# Patient Record
Sex: Male | Born: 1935 | Race: White | Hispanic: No | Smoking: Former smoker
Health system: Southern US, Community
[De-identification: ages and names within clinical notes are randomized; demographics above are authoritative.]

## PROBLEM LIST (undated history)

## (undated) DIAGNOSIS — C801 Malignant (primary) neoplasm, unspecified: Secondary | ICD-10-CM

## (undated) DIAGNOSIS — Z972 Presence of dental prosthetic device (complete) (partial): Secondary | ICD-10-CM

## (undated) DIAGNOSIS — E119 Type 2 diabetes mellitus without complications: Secondary | ICD-10-CM

## (undated) DIAGNOSIS — Z973 Presence of spectacles and contact lenses: Secondary | ICD-10-CM

## (undated) DIAGNOSIS — M1990 Unspecified osteoarthritis, unspecified site: Secondary | ICD-10-CM

## (undated) DIAGNOSIS — I10 Essential (primary) hypertension: Secondary | ICD-10-CM

## (undated) HISTORY — DX: Unspecified osteoarthritis, unspecified site: M19.90

## (undated) HISTORY — PX: COLONOSCOPY: SHX174

## (undated) HISTORY — PX: COLONOSCOPY W/ BIOPSIES AND POLYPECTOMY: SHX1376

---

## 1997-07-27 ENCOUNTER — Other Ambulatory Visit: Admission: RE | Admit: 1997-07-27 | Discharge: 1997-07-27 | Payer: Self-pay | Admitting: Cardiology

## 1999-03-20 ENCOUNTER — Ambulatory Visit (HOSPITAL_COMMUNITY): Admission: RE | Admit: 1999-03-20 | Discharge: 1999-03-20 | Payer: Self-pay | Admitting: Cardiology

## 1999-03-20 ENCOUNTER — Encounter: Payer: Self-pay | Admitting: Cardiology

## 1999-06-05 ENCOUNTER — Encounter: Admission: RE | Admit: 1999-06-05 | Discharge: 1999-09-03 | Payer: Self-pay | Admitting: Internal Medicine

## 2001-08-19 ENCOUNTER — Emergency Department (HOSPITAL_COMMUNITY): Admission: EM | Admit: 2001-08-19 | Discharge: 2001-08-20 | Payer: Self-pay | Admitting: Emergency Medicine

## 2001-08-20 ENCOUNTER — Encounter: Payer: Self-pay | Admitting: Emergency Medicine

## 2001-10-08 ENCOUNTER — Ambulatory Visit (HOSPITAL_COMMUNITY): Admission: RE | Admit: 2001-10-08 | Discharge: 2001-10-08 | Payer: Self-pay | Admitting: Cardiology

## 2002-10-26 ENCOUNTER — Encounter: Admission: RE | Admit: 2002-10-26 | Discharge: 2002-10-26 | Payer: Self-pay | Admitting: Cardiology

## 2002-10-26 ENCOUNTER — Encounter: Payer: Self-pay | Admitting: Cardiology

## 2003-09-29 ENCOUNTER — Ambulatory Visit (HOSPITAL_COMMUNITY): Admission: RE | Admit: 2003-09-29 | Discharge: 2003-09-29 | Payer: Self-pay | Admitting: Cardiology

## 2004-04-16 HISTORY — PX: PARTIAL COLECTOMY: SHX5273

## 2004-04-16 HISTORY — PX: STERNOTOMY: SHX1057

## 2004-04-16 HISTORY — PX: UPPER GASTROINTESTINAL ENDOSCOPY: SHX188

## 2004-08-14 ENCOUNTER — Ambulatory Visit (HOSPITAL_COMMUNITY): Admission: RE | Admit: 2004-08-14 | Discharge: 2004-08-14 | Payer: Self-pay | Admitting: *Deleted

## 2004-08-14 ENCOUNTER — Encounter (INDEPENDENT_AMBULATORY_CARE_PROVIDER_SITE_OTHER): Payer: Self-pay | Admitting: *Deleted

## 2004-08-30 ENCOUNTER — Encounter: Admission: RE | Admit: 2004-08-30 | Discharge: 2004-08-30 | Payer: Self-pay | Admitting: General Surgery

## 2004-09-08 ENCOUNTER — Inpatient Hospital Stay (HOSPITAL_COMMUNITY): Admission: RE | Admit: 2004-09-08 | Discharge: 2004-09-13 | Payer: Self-pay | Admitting: General Surgery

## 2004-09-08 ENCOUNTER — Encounter (INDEPENDENT_AMBULATORY_CARE_PROVIDER_SITE_OTHER): Payer: Self-pay | Admitting: *Deleted

## 2004-11-01 ENCOUNTER — Encounter: Admission: RE | Admit: 2004-11-01 | Discharge: 2004-11-01 | Payer: Self-pay | Admitting: Thoracic Surgery

## 2005-02-07 ENCOUNTER — Encounter: Admission: RE | Admit: 2005-02-07 | Discharge: 2005-02-07 | Payer: Self-pay | Admitting: Thoracic Surgery

## 2005-03-01 ENCOUNTER — Encounter (INDEPENDENT_AMBULATORY_CARE_PROVIDER_SITE_OTHER): Payer: Self-pay | Admitting: *Deleted

## 2005-03-01 ENCOUNTER — Inpatient Hospital Stay (HOSPITAL_COMMUNITY): Admission: RE | Admit: 2005-03-01 | Discharge: 2005-03-04 | Payer: Self-pay | Admitting: Thoracic Surgery

## 2005-03-14 ENCOUNTER — Encounter: Admission: RE | Admit: 2005-03-14 | Discharge: 2005-03-14 | Payer: Self-pay | Admitting: Thoracic Surgery

## 2005-04-11 ENCOUNTER — Encounter: Admission: RE | Admit: 2005-04-11 | Discharge: 2005-04-11 | Payer: Self-pay | Admitting: Thoracic Surgery

## 2005-06-13 ENCOUNTER — Encounter: Admission: RE | Admit: 2005-06-13 | Discharge: 2005-06-13 | Payer: Self-pay | Admitting: Thoracic Surgery

## 2005-10-04 ENCOUNTER — Encounter (INDEPENDENT_AMBULATORY_CARE_PROVIDER_SITE_OTHER): Payer: Self-pay | Admitting: Specialist

## 2005-10-04 ENCOUNTER — Ambulatory Visit (HOSPITAL_COMMUNITY): Admission: RE | Admit: 2005-10-04 | Discharge: 2005-10-04 | Payer: Self-pay | Admitting: *Deleted

## 2005-12-12 ENCOUNTER — Encounter: Admission: RE | Admit: 2005-12-12 | Discharge: 2005-12-12 | Payer: Self-pay | Admitting: Thoracic Surgery

## 2006-11-29 IMAGING — CR DG CHEST 1V PORT
1 series · 1 of 1 positions shown · non-contrast
Comparison: none

CLINICAL DATA: Mediastinal mass.
 PORTABLE SINGLE VIEW CHEST - 03/03/05: 
 Compared to one day ago.

[view not recorded]
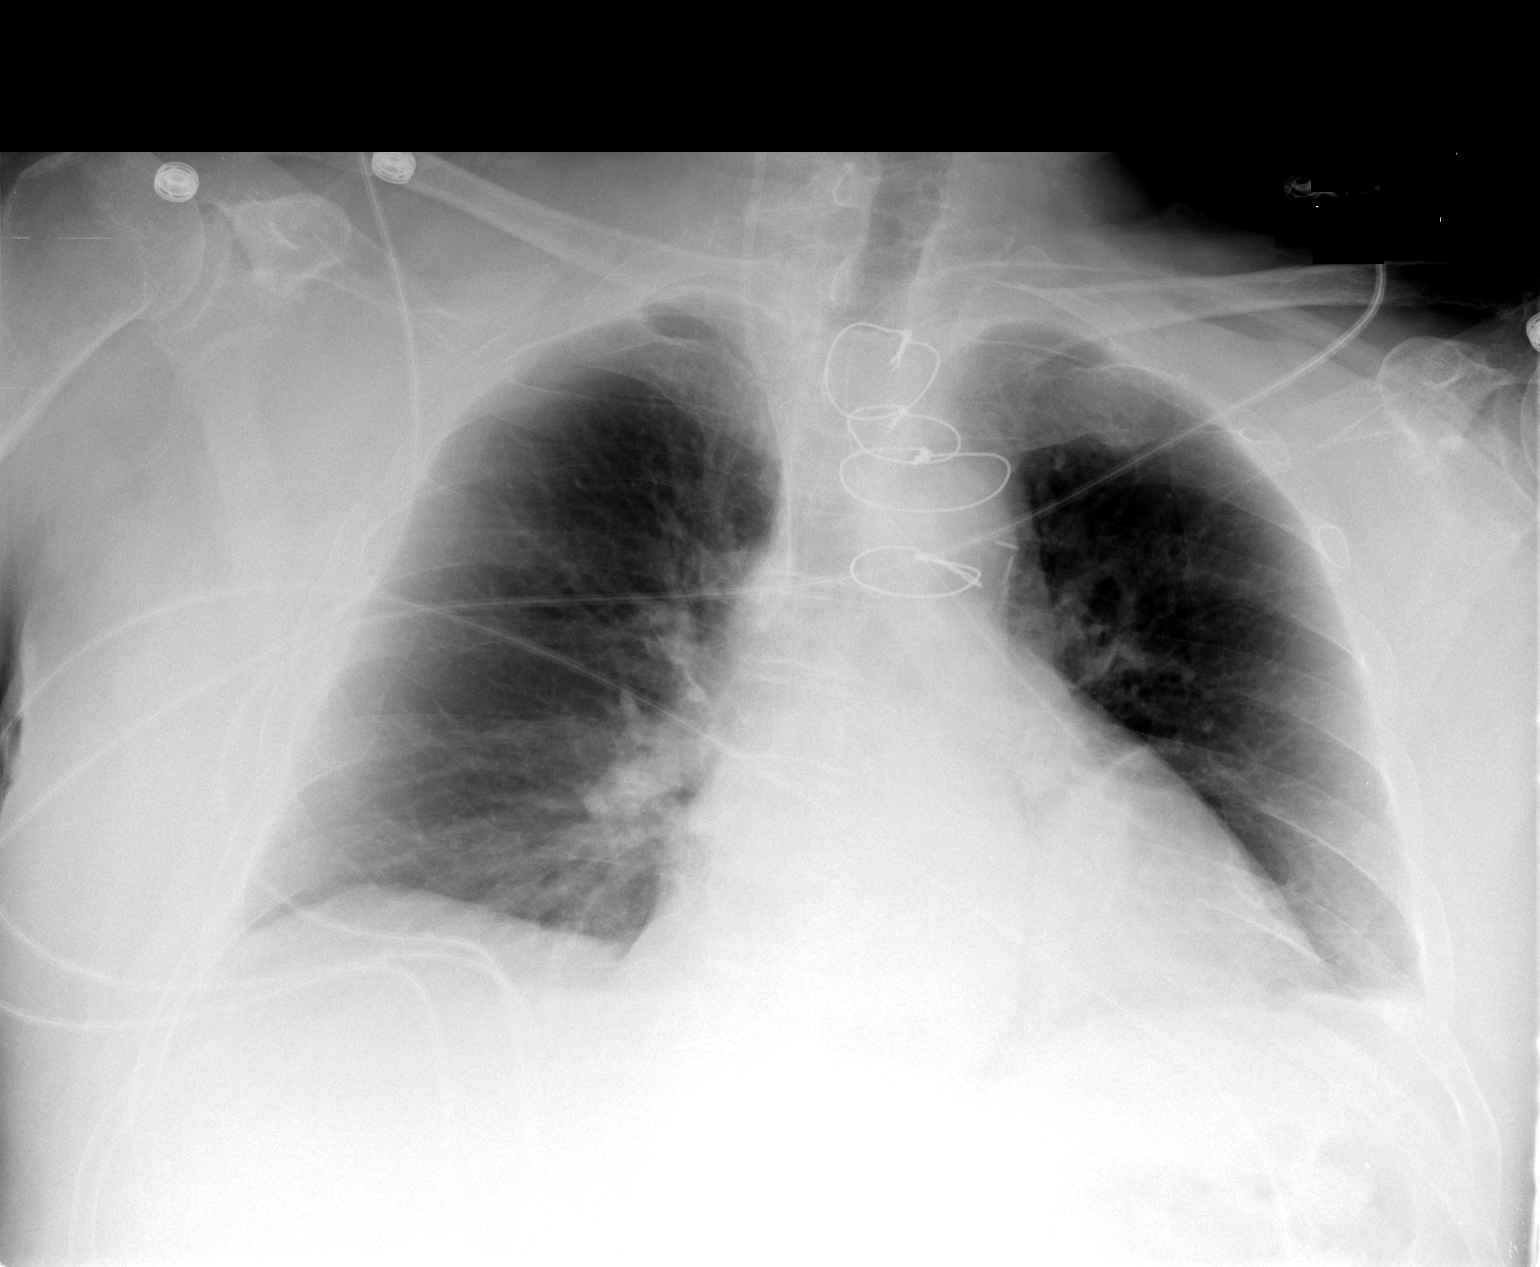

[1 of 1 positions shown; findings below may reference images not displayed]

FINDINGS: The right-sided internal jugular line is unchanged in position.  There are median sternotomy wires.  The heart remains at the upper limits of normal for size.  The right lung remains clear.  A small left-sided pleural effusion and minimal left basilar atelectasis are similar.  A drain projecting over the left side of the mediastinum appears somewhat more superiorly positioned today.
IMPRESSION: Similar appearance of small left pleural effusion and minimal left basilar atelectasis.

## 2006-11-30 IMAGING — CR DG CHEST 2V
2 series · 2 of 2 positions shown · non-contrast
Comparison: 03/03/05.

CLINICAL DATA: Soreness.  Mediastinal mass.  
 CHEST - 2 VIEWS:

[w chest pa]
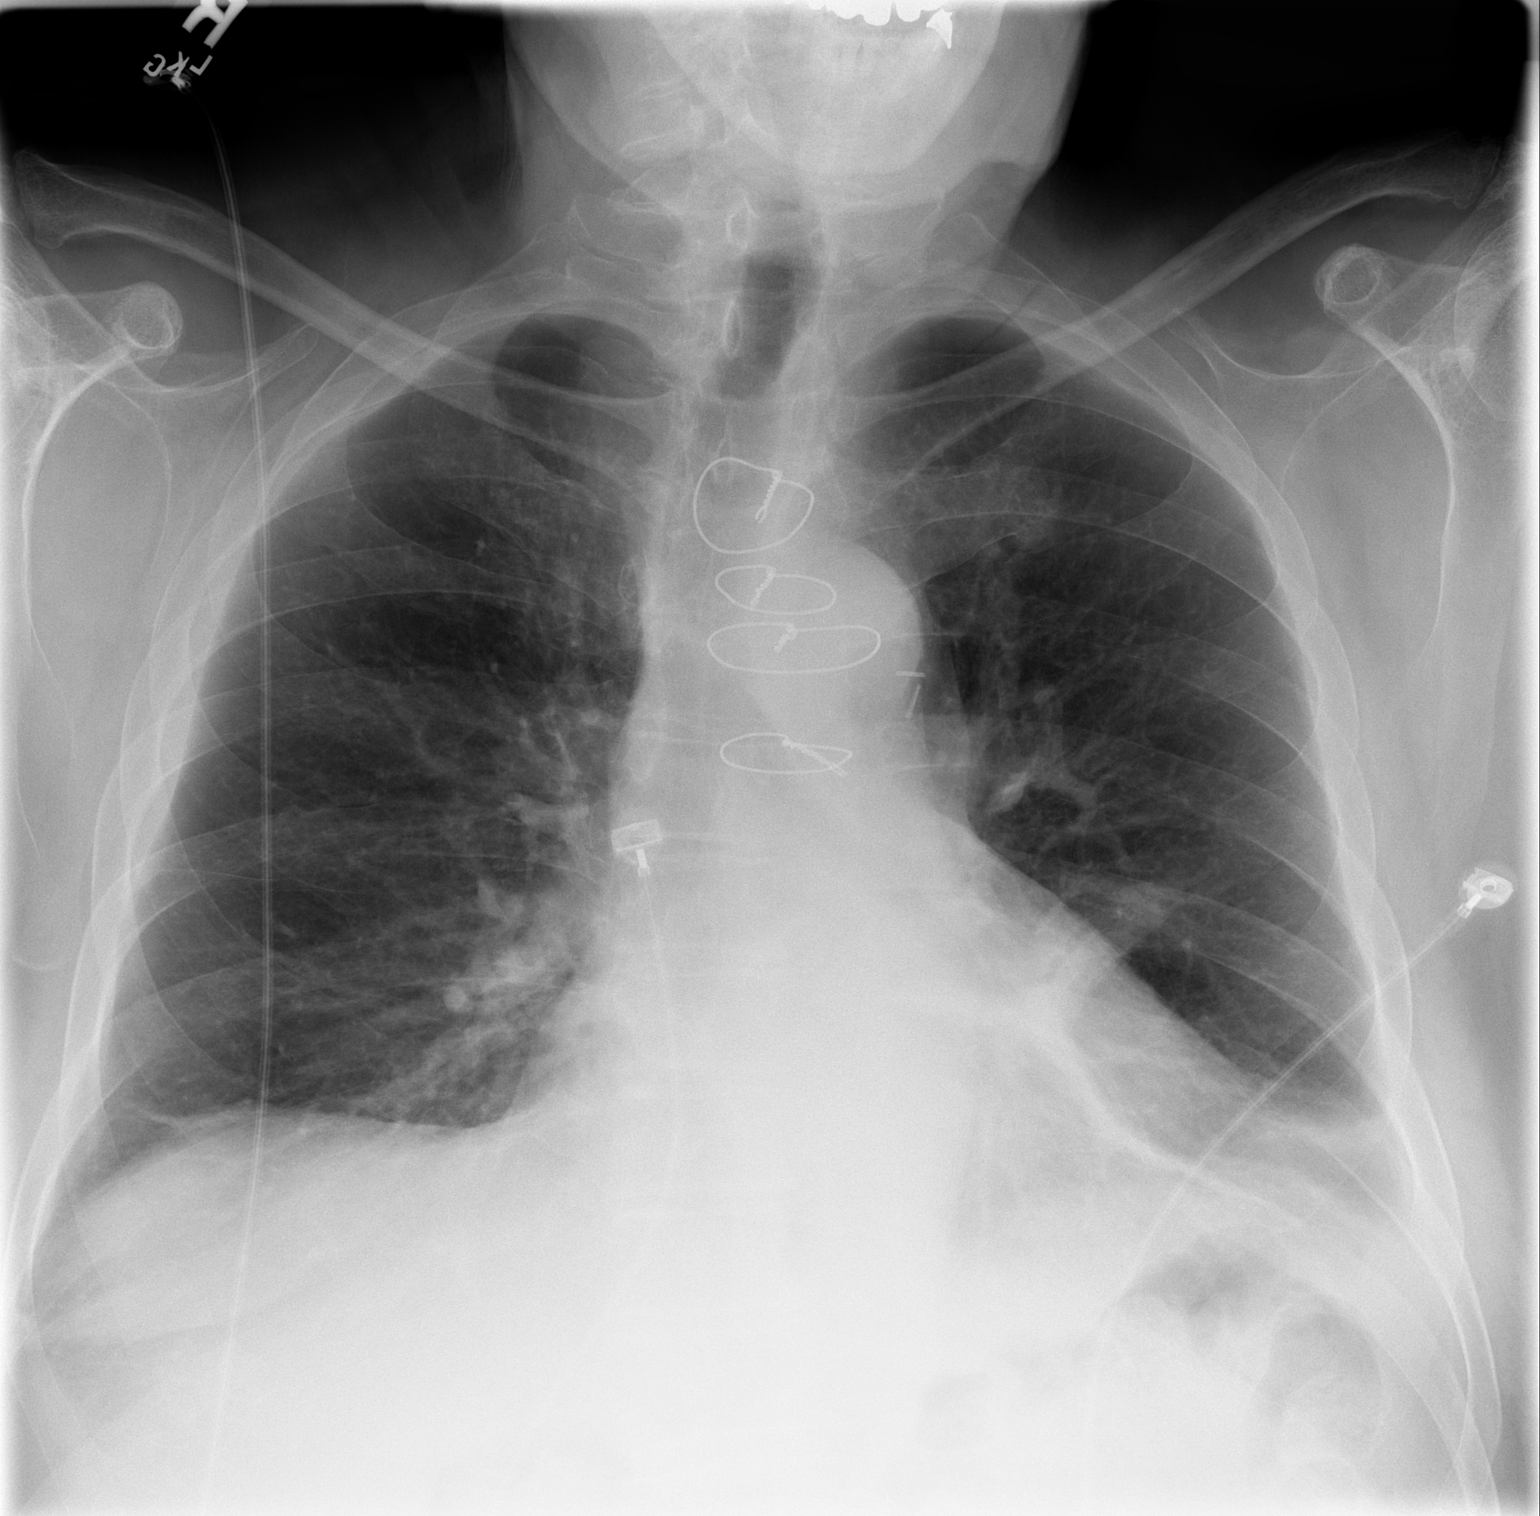

[w chest lat]
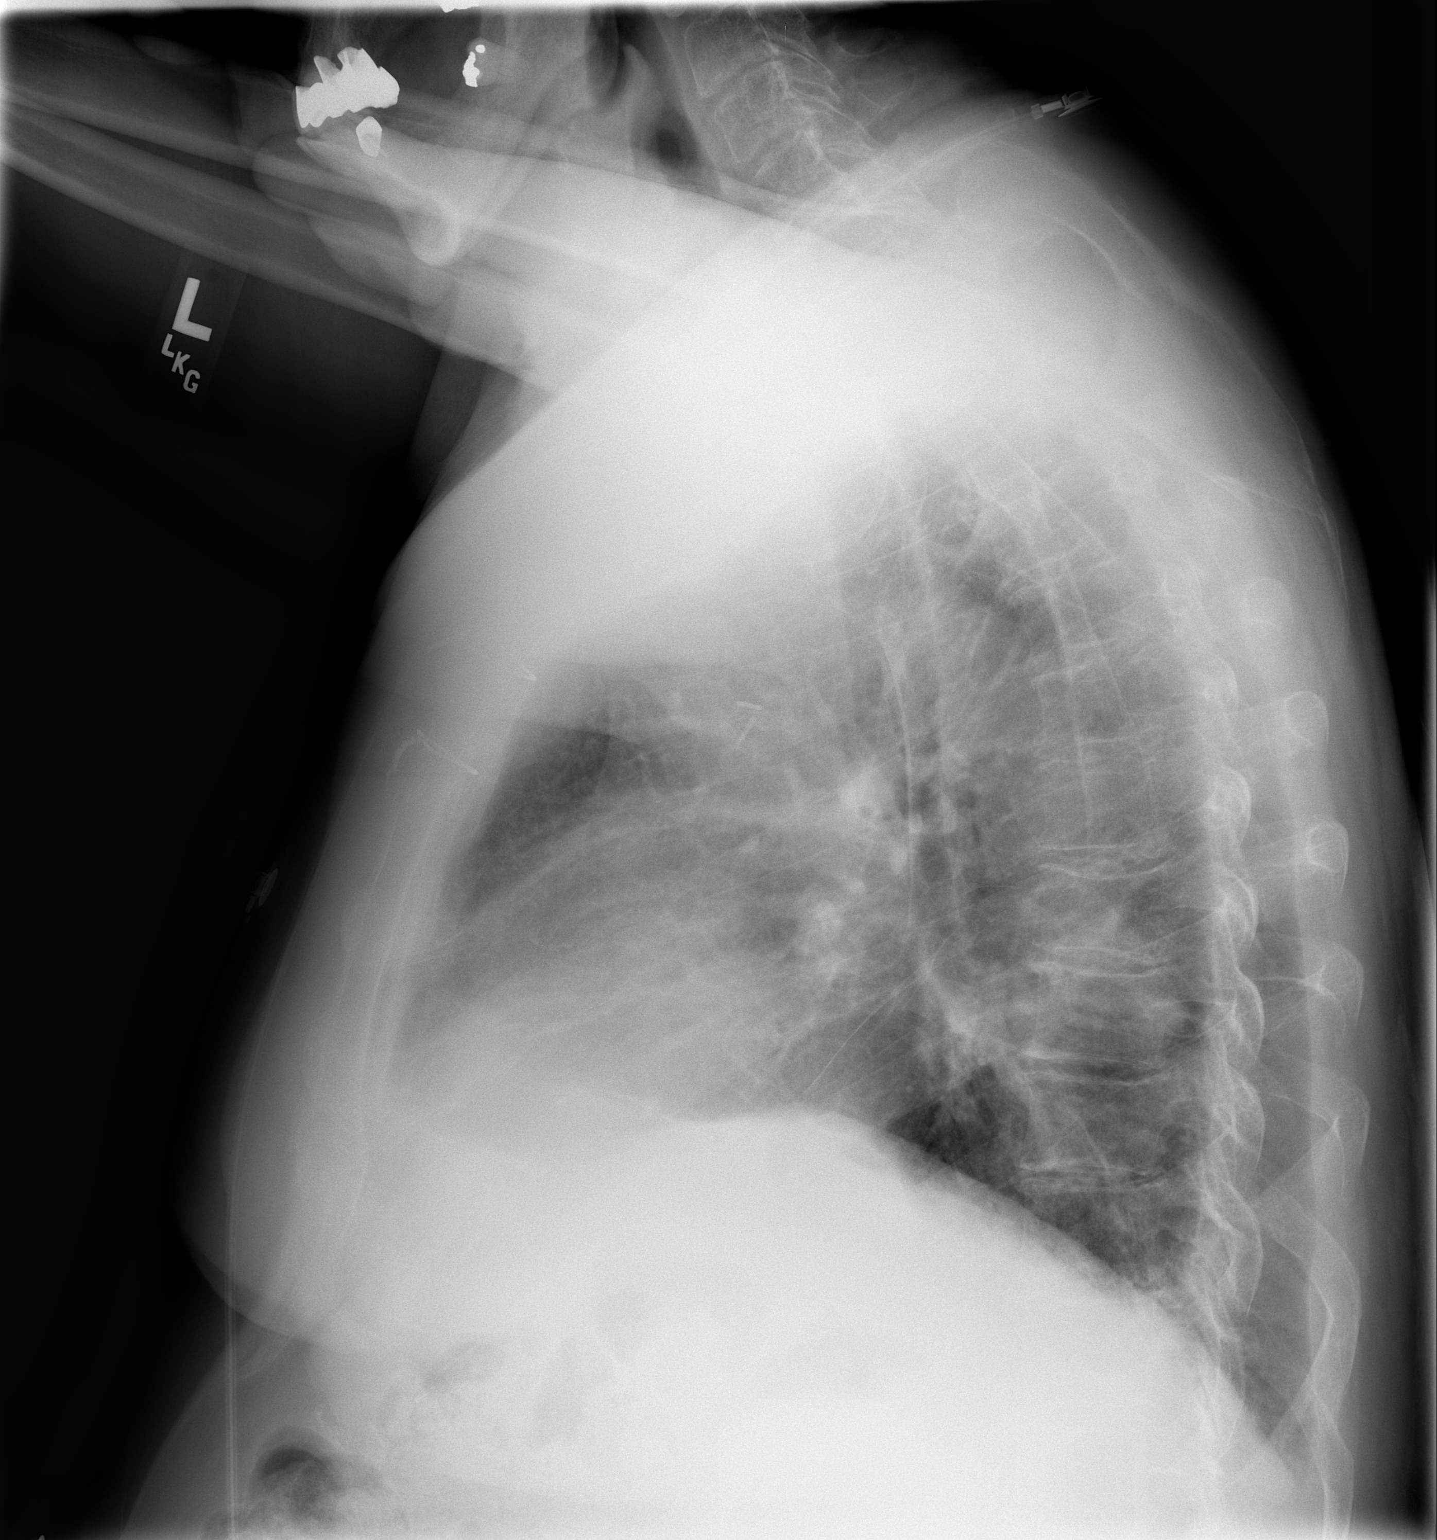

[2 of 2 positions shown; findings below may reference images not displayed]

FINDINGS: There is cardiomegaly.  Left effusion with overlying atelectasis, unchanged.  Minimal subsegmental atelectasis is identified at the right lung base.
 New lingular atelectasis is also noted.
IMPRESSION: 1.  Increasing subsegmental atelectasis within the lingular segment of the left lung as well as the left lung base. 
 2.  Stable small effusion.

## 2007-03-19 ENCOUNTER — Encounter: Admission: RE | Admit: 2007-03-19 | Discharge: 2007-03-19 | Payer: Self-pay | Admitting: General Surgery

## 2007-11-10 ENCOUNTER — Encounter: Admission: RE | Admit: 2007-11-10 | Discharge: 2007-11-10 | Payer: Self-pay | Admitting: General Surgery

## 2010-09-01 NOTE — Op Note (Signed)
NAME:  Jonathan Owen, KICKLIGHTER             ACCOUNT NO.:  [id number]   MEDICAL RECORD NO.:  [id number]          PATIENT TYPE:  INP   LOCATION:  X001                         FACILITY:  [facility]   PHYSICIAN:  Sharlet Salina T. Hoxworth, M.D.DATE OF BIRTH:  07-Apr-1928   DATE OF PROCEDURE:  09/08/2004  DATE OF DISCHARGE:                                 OPERATIVE REPORT   PREOPERATIVE DIAGNOSES:  Carcinoma of the sigmoid colon.   POSTOPERATIVE DIAGNOSES:  Carcinoma of the sigmoid colon.   SURGICAL PROCEDURES:  Sigmoid colectomy.   SURGEON:  Lorne Skeens. Hoxworth, M.D.   ASSISTANT:  Anselm Pancoast. Zachery Dakins, M.D.   ANESTHESIA:  General.   BRIEF HISTORY:  Jonathan Owen is a 77 year old male recently found to have  heme positive stools on routine exam. Colonoscopy has revealed a large polyp  at 25 centimeters felt too large for colonoscopic excision. This was  biopsied revealing tubulovillous adenoma with adenocarcinoma. With this  finding, we have recommended proceeding with a sigmoid colectomy. The nature  of the procedure, indications, risks of bleeding, infection, anastomotic  leak and cardiorespiratory complications were discussed and understood. His  is now brought to the operating room for this procedure.   DESCRIPTION OF OPERATION:  Following mechanical antibiotic bowel prep at  home, the patient was brought to the operating room, placed in supine  position on the operating table and general endotracheal anesthesia was  induced. A Foley catheter was placed. The abdomen was widely sterilely  prepped and draped. A low midline incision was used and dissection carried  down through the subcutaneous tissue, midline fascia and peritoneum using  cautery. Exploration was then performed. The small bowel appeared normal.  Could really not palpate the upper abdomen through the small low midline  incision. The colon was carefully palpated and there was a soft mass  palpable in the distal half of the sigmoid  colon. There was no evidence of  any invasion through the bowel wall. No evidence of any adenopathy. The  viscera packed in the upper abdomen. The descending and sigmoid colon was  extensively mobilized dividing lateral peritoneal attachments. The ureters  were identified and carefully protected throughout the remainder of the  dissection. A point of division of the colon was chosen at the junction of  the sigmoid and descending colon and this area was cleaned of mesentery and  divided between the [employer] clamp. The mesentery of the sigmoid colon  was then sequentially divided between clamps and tied with silk ties. A wide  mesenteric resection was done and this was carried down to the distal  sigmoid which was then cleaned of mesentery and pericolic fat and sharply  divided and removed. This specimen was opened and the lesion was confirmed  to be within the specimen with widely negative margins. Following this, an  end-to-end anastomosis was performed with interrupted 2-0 silk inverting  sutures. This was done under no tension with good blood supply and appeared  widely patent. The abdomen was then thoroughly irrigated and hemostasis  assured. The anastomosis was coated with Tisseel tissue sealant. The viscera  returned to their anatomic  position. Gloves and instruments were changed.  The  midline fascia was then closed using running #1 PDS beginning at either end  of the incision and tied centrally. The subcutaneous tissue was irrigated  and skin closed with staples. Sponge, needle and instrument counts were  correct. Dry sterile dressings were applied and the patient taken to  recovery in good condition.      BTH/MEDQ  D:  09/08/2004  T:  09/08/2004  Job:  [id number]

## 2010-09-01 NOTE — H&P (Signed)
NAME:  Jonathan Owen, Jonathan Owen             ACCOUNT NO.:  [id number]   MEDICAL RECORD NO.:  [id number]          PATIENT TYPE:  INP   LOCATION:  NA                           FACILITY:  [facility]   PHYSICIAN:  Ines Bloomer, M.D. DATE OF BIRTH:  Oct 04, 1927   DATE OF ADMISSION:  DATE OF DISCHARGE:                                HISTORY & PHYSICAL   DATE OF ADMISSION:  March 01, 2005   CHIEF COMPLAINT:  Thymic mass.   This 77 year old patient was in good health all his life but was found to  have a left thymic mass and in workup was found to have a 2.2 cm nodule on  the anterior thymic area. This was thought to be either adenopathy or  thymoma. He had a negative Cardiolite and a lower endoscopy by Dr. Virginia Rochester in  which colon cancer was found. His pulmonary function tests showed an FVC of  3.30, an FEV1 of 2.06. He subsequently underwent a left colectomy by Dr.  Johna Sheriff and was followed up with a serial CT scan that unfortunately showed  that this thymic nodule postoperatively had not changed in size. There was  some slight variation in size but it was essentially 19 x 23 cm. 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   PAST MEDICAL HISTORY:  He has hypercholesterolemia, hypertension, diabetes.   MEDICATIONS:  1.  Micardis 40 mg a day.  2.  Toprol-XL 25 mg at bedtime.  3.  Glyburide 5 mg twice a day.  4.  Diazepam 1 mg as needed.   He has no allergies.   FAMILY HISTORY:  Negative for cancer, vascular disease, and pulmonary  disease.   SOCIAL HISTORY:  He is married, has two children. He comes in today with his  two children. He has worked as a Proofreader. He does not drink or smoke.   REVIEW OF SYSTEMS:  He is 220 pounds, he is 5 feet 8 inches; his weight has  been stable. He has had no angina or atrial fibrillation and, as mentioned,  had recent previous negative Cardiolite. PULMONARY:  No  hemoptysis,  wheezing, shortness of breath. GI:  As in history of present illness. GU:  No dysuria, frequent urinations, or kidney disease. VASCULAR:  No  claudication, DVT, or TIAs. NEUROLOGIC:  There is no history of headaches,  blackouts, or seizures. ORTHOPEDICS:  He has arthritis and chronic joint  pains. PSYCHIATRIC:  No history of depression or other psychiatric  illnesses. EYE/ENT:  No changes in his eyesight or hearing. HEMATOLOGICAL:  No history of anemia.   PHYSICAL EXAMINATION:  GENERAL:  He is a well-developed Caucasian male in no  acute distress.  VITAL SIGNS:  His blood pressure is 152/88, pulse 104, respirations 18,  saturations were 96%.  HEENT:  Head is atraumatic. Eyes:  Pupils equally reactive to light and  accommodation. Extraocular movements are normal. Ears:  Tympanic membranes  are intact. Nose:  There is no septal deviation. Throat is without lesions.  NECK:  Supple  without thyromegaly.  CHEST:  Clear to auscultation and percussion.  HEART:  Regular sinus rhythm, no murmurs.  ABDOMEN:  Soft. There is a previous scar that is well healed. There is no  hepatosplenomegaly.  EXTREMITIES:  Pulses are 2+, no clubbing or edema.  NEUROLOGIC:  He was oriented x3. Cranial nerves are intact and sensory and  motor intact.   IMPRESSION:  1.  Status post colonic resection.  2.  Anterior mediastinal mass.  3.  Hypertension.  4.  Diabetes.  5.  Hypercholesterolemia.   PLAN:  Median sternotomy and resection of thymic mass.           ______________________________  Ines Bloomer, M.D.     DPB/MEDQ  D:  02/27/2005  T:  02/27/2005  Job:  [id number]

## 2010-09-01 NOTE — Op Note (Signed)
NAME:  Jonathan Owen, Jonathan Owen             ACCOUNT NO.:  [id number]   MEDICAL RECORD NO.:  [id number]          PATIENT TYPE:  AMB   LOCATION:  ENDO                         FACILITY:  [facility]   PHYSICIAN:  Georgiana Spinner, M.D.    DATE OF BIRTH:  07-12-1927   DATE OF PROCEDURE:  08/14/2004  DATE OF DISCHARGE:                                 OPERATIVE REPORT   PROCEDURE:  Upper endoscopy.   INDICATIONS:  Hemoccult positivity.   ANESTHESIA:  Demerol 50 mg, Versed 7 mg.   PROCEDURE:  With the patient mildly sedated in the left lateral decubitus  position, the Olympus videoscopic endoscope was inserted in the mouth,  passed under direct vision through the esophagus, which appeared normal,  until we reached the distal esophagus, and this area was photographed and  biopsied to rule out Barrett's.  We entered into the stomach.  The fundus,  body, antrum, duodenal bulb, second portion of duodenum appeared normal.  From this point the endoscope was slowly withdrawn taking circumferential  views of duodenal mucosa until the endoscope had been pulled back in the  stomach, placed in retroflexion to view the stomach from below.  The  endoscope was straightened and withdrawn, taking circumferential views of  the remaining gastric and esophageal mucosa.  The patient's vital signs and  pulse oximetry remained stable.  The patient tolerated the procedure well  without apparent complications.   FINDINGS:  Unremarkable examination other than the distal esophagus, which  was biopsied.   Await biopsy report.  The patient will call me for results and follow up  with me as an outpatient.  Proceed to colonoscopy.      GMO/MEDQ  D:  08/14/2004  T:  08/14/2004  Job:  [id number]   cc:   Othelia Pulling, M.D.  [address], [address]  [place]  [place] [address]  Fax: [phone]

## 2010-09-01 NOTE — Op Note (Signed)
NAME:  Jonathan Owen, Jonathan Owen             ACCOUNT NO.:  [id number]   MEDICAL RECORD NO.:  [id number]          PATIENT TYPE:  AMB   LOCATION:  ENDO                         FACILITY:  [facility]   PHYSICIAN:  Georgiana Spinner, M.D.    DATE OF BIRTH:  1927-10-05   DATE OF PROCEDURE:  08/14/2004  DATE OF DISCHARGE:                                 OPERATIVE REPORT   PROCEDURE PERFORMED:  Colonoscopy with biopsy.   INDICATIONS FOR PROCEDURE:  Hemoccult positivity.   ANESTHESIA:  Demerol 20 mg, Versed 1 mg.   DESCRIPTION OF PROCEDURE:  With the patient mildly sedated in the left  lateral decubitus position, a rectal examination was performed.  The  prostate appeared normal.  Subsequently, the Olympus videoscopic colonoscope  was inserted into the rectum and passed under direct vision into the cecum,  identified by ileocecal valve and appendiceal orifice, both of which were  photographed.  From this point, the colonoscope was slowly withdrawn taking  circumferential views of the colonic mucosa, stopping to photograph  diverticula seen along the way in the sigmoid colon,  We also stopped at the  descending colon where a polyp was seen and photographed and removed using  snare cautery technique setting of 20/20 blended current.  We then stopped  at 25 cm from the anal verge at which point another small polyp was seen,  photographed and removed, again using snare cautery technique with the same  settings.  At this juncture, there was also a fairly large multilobulated  polypoid mass that was photographed and biopsied.  Once the biopsy tissue  was obtained, the endoscope was withdrawn to the rectum which appeared  normal on direct and showed hemorrhoids on retroflex view.  The endoscope  was straightened and withdrawn.  The patient's vital signs and pulse  oximeter remained stable.  The patient tolerated the procedure well without  apparent complications.   FINDINGS:  Diverticulosis of the sigmoid colon.   Small polyp of descending  colon at 25 cm from the anal verge which was combined with a large mass at  this area as well, which was biopsied and internal hemorrhoids were noted.   PLAN:  Await biopsy report.  The patient will call me for results and follow  up with me as an outpatient.      GMO/MEDQ  D:  08/14/2004  T:  08/14/2004  Job:  [id number]

## 2010-09-01 NOTE — Op Note (Signed)
NAME:  Jonathan Owen, Jonathan Owen                         ACCOUNT NO.:  [id number]   MEDICAL RECORD NO.:  [id number]                   PATIENT TYPE:  OUT   LOCATION:  NUC                                  FACILITY:  [facility]   PHYSICIAN:  Othelia Pulling, M.D.                   DATE OF BIRTH:  [birth year]   DATE OF PROCEDURE:  09/29/2003  DATE OF DISCHARGE:                                 OPERATIVE REPORT   PROCEDURE:  Stress Adenosine nuclear perfusion.   INDICATIONS FOR PROCEDURE:  A 77 year old male with history of atypical  chest pain manifested by pain in the left upper chest radiating into the  left shoulder and into the left intrascapular area.  This had been off and  on unrelated to exertion or activity.  He had a resting EKG as an  outpatient.  He also has had a prior stress treadmill study two years ago  which was normal.  However, he does have multiple risk factors of obesity  with a weight of 255, elevated blood pressure in the range of 140/90,  history of diabetes for two years controlled by Glucophage and diet, and has  a family history of diabetes.  He is not a smoker.  His lipids are  borderline normal.   PHYSICAL EXAMINATION:  VITAL SIGNS:  Blood pressure 140/73, pulse 80.  HEENT:  Negative.  LUNGS:  Clear, no wheezing.  HEART:  Unremarkable.  Good peripheral pulsations.   DESCRIPTION OF PROCEDURE:  The pre- Adenosine injection of exercising two  minutes revealed no changes on EKG or any chest pain.  His blood pressure  rose to 140/90.  Infusion of Adenosine without incident.  A slight amount of  GI discomfort, but no chest pain.  There were no EKG changes or other  changes to suggest side effects.  He was therefore proceeded to have an  injection of a Cardiolite and a perfusion study is pending.                                               Othelia Pulling, M.D.    Cordelia Pen  D:  09/29/2003  T:  09/29/2003  Job:  [id number]

## 2010-09-01 NOTE — Op Note (Signed)
NAME:  Jonathan Owen, Jonathan Owen             ACCOUNT NO.:  [id number]   MEDICAL RECORD NO.:  [id number]          PATIENT TYPE:  INP   LOCATION:  2899                         FACILITY:  [facility]   PHYSICIAN:  Ines Bloomer, M.D. DATE OF BIRTH:  [birth year]   DATE OF PROCEDURE:  03/01/2005  DATE OF DISCHARGE:                                 OPERATIVE REPORT   PREOPERATIVE DIAGNOSIS:  Status post colon cancer resection, thymic mass.   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   OPERATION PERFORMED:  Partial median sternotomy with thymectomy.   SURGEON:  Ines Bloomer, M.D.   ASSISTANT:  Jerold Coombe, P.A.   ANESTHESIA:  General.   DESCRIPTION OF PROCEDURE:  After insertion of all monitoring lines, the  patient under general anesthesia, he was prepped and draped in the usual  sterile manner.  This patient had a 2 by almost 3 cm, 2 to 2.5 cm lesion in  the inferior portion of the left pole of the thymus gland.  It had been  followed with serial CTs and had changed or slightly increased in size.  Because it has not ___________ though it was negative on PET __________, it  was decided to do a thymectomy because of the worry for thymoma.  It  appeared to be solid on CT scan.   After general anesthesia and prepped and draped in the usual sterile  fashion, incision was made starting at the sternal notch and carried down  about 6 to 8 cm down the midline.  The subcutaneous tissue and muscle was  divided with electrocautery.  The sternum was exposed and an incision in the  sternum was made from the notch down to the angle of Lewy dividing in the  manubrium.  A lamina spreader was placed to spread the sternum.  Suction was  started superiorly dissecting out both the left and right horns of the  thymus gland and then identifying the innominate vein and resecting the  branch to the thymus.  Attention was then turned to the left side.  We first  started inferiorly dissecting the left  side of the thymus gland off the  pericardium.  The pleura was entered on the left side, and the phrenic nerve  was identified and protected, and we could see in the inferior pole, there  was a cystic like structure that appeared to be more of a thymic cyst than  anything.  The thymus was somewhat thickened in this area.  The entire pole  of the left lower lobe of the thymus was dissected out dissecting it off the  phrenic nerve and dissecting it superiorly leaving the internal mammary vein  and dissecting it just medially to the internal mammary vein.  When the left  upper pole and lower pole of the thymus gland on the left side was resected,  attention was turned to the right side and the right inferior pole was  dissected off the pleura.  We did not enter the pleura on the right side and  then dissected up superiorly, dissecting it off the innominate vein.  The  lesion  was removed and the cyst marked with a stitch. The chest was closed  with four #6 wires in a twisted fashion, #1 Vicryl in the muscle layer.  A  Blake drain  had been placed through a separate stab wound down into the superior  mediastinum and sutured in place with 2-0 silk.  The subcutaneous tissues  were closed with 2-0 Vicryl and the subcuticular with 3-0 Vicryl.  Dermabond  to the skin.  The patient was then transferred to the recovery room in  stable condition.           ______________________________  Ines Bloomer, M.D.     DPB/MEDQ  D:  03/01/2005  T:  03/01/2005  Job:  [id number]

## 2010-09-01 NOTE — Discharge Summary (Signed)
NAME:  Jonathan Owen, Jonathan Owen             ACCOUNT NO.:  [id number]   MEDICAL RECORD NO.:  [id number]          PATIENT TYPE:  INP   LOCATION:  2027                         FACILITY:  [facility]   PHYSICIAN:  Ines Bloomer, M.D. 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   SECONDARY DIAGNOSIS:  1.  Hyperlipidemia.  2.  Hypertension.  3.  Diabetes mellitus.   ALLERGIES:  No known drug allergies.   IN-HOSPITAL OPERATIONS AND PROCEDURES:  Partial median sternotomy with  thymectomy.   HOSPITAL COURSE:  The patient is a 77 year old male who was in good healthy  of late but developed a left thymic mass and on workup was found to have a  2.2 cm nodule of the anterior thymic area.  This was thought to be either  adenopathy or thymoma.  He had a negative Cardiolite and a lower endoscopy  by Dr. Virginia Rochester which colon cancer was found.  Pulmonary function tests showed  FVC 3.3, FEV1 of 2.06.  He subsequently underwent a left colectomy by Dr.  Johna Sheriff and was followed up with serial CT scans which unfortunately showed  that this thymic nodule postoperatively had not changed in size.  There was  some slight variation in size and is 19 by 23 cm.  It is recommended that he  have resection of this.  The patient was seen and evaluated by Dr. Edwyna Shell.  Dr. Edwyna Shell suggests the patient undergoing a partial median sternotomy with  thymectomy.  He discussed the risks and benefits of this procedure.  The  patient acknowledged understanding and agreed to proceed.  The patient was  scheduled for surgery for March 01, 2005.   The patient was taken to the operating room March 01, 2005, where he  underwent partial median sternotomy with thymectomy.  The patient tolerated  the procedure well and was transferred out to the intensive  care unit in  stable condition.  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  The patient's postoperative  course was very much unremarkable.  He was out of bed ambulating well  postoperatively.  His incision is clean, dry, and intact and healing well.  He remained in normal sinus rhythm.  The patient remained afebrile  postoperatively.  Postop chest x-rays were stable.  The patient's blood  pressure was well controlled.  He was tolerating diet well with no nausea  and vomiting noted.  The patient was discharged to home postop day three in  stable condition.   A follow up appointment was scheduled with Dr. Edwyna Shell for March 14, 2005,  at 12:10 p.m.  The patient is to obtain a PA and lateral chest x-ray one  hour prior to this appointment.  Mr. Daisey received instructions on on  diet, activity, incisional care.  He was told no driving until released to  do so, no heavy lifting over 10 pounds.  The  patient was told he was allowed  to shower washing his incisions using soap and water.  He is to contact the  office if she develops any drainage or opening from any of his incision  sites.  The patient acknowledged understanding.  The patient is told to  ambulate 3-4 times per day to progress as tolerated.  He is educated on diet  to be low fat, low salt.  He, again, acknowledged understanding.   DISCHARGE MEDICATIONS:  1.  Micardis 40 mg daily.  2.  Toprol XL 25 mg daily.  3.  Glyburide 5 mg b.i.d.  4.  Diazepam 1 mg daily p.r.n.  5.  Tylox 1-2 tabs p.o. q.4-6h. p.r.n. pain.      Theda Belfast, PA    ______________________________  Ines Bloomer, M.D.    KMD/MEDQ  D:  04/25/2005  T:  04/25/2005  Job:  [id number]

## 2010-09-01 NOTE — Discharge Summary (Signed)
NAME:  Jonathan Owen, Jonathan Owen             ACCOUNT NO.:  [id number]   MEDICAL RECORD NO.:  [id number]          PATIENT TYPE:  INP   LOCATION:  1618                         FACILITY:  [facility]   PHYSICIAN:  Sharlet Salina T. 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   SURGICAL PROCEDURE:  Sigmoid colectomy performed on Sep 08, 2004.   HISTORY OF PRESENT ILLNESS:  Jonathan Owen is a 77 year old male who recently  presented to Dr. Ardeen Jourdain, and was found to have heme-positive stool on  examination.  He was referred to Dr. Sabino Gasser who performed upper and  lower endoscopy, most significant for a polyp of 25.0 cm which was removed  and also a larger, multi-lobulated, polypoid mass right at 25.0 cm that was  biopsied.  Biopsy revealed fragments of tubulovillous adenoma with one  fragment demonstrating frank adenocarcinoma.  After office evaluation, we  had planned to proceed with sigmoid colectomy, and following mechanical  antibiotic bowel prep, the patient was admitted for this procedure.   PAST MEDICAL HISTORY:  Surgeries:  None.  No hospitalizations.  He is  followed for mild type 2 diabetes and hypertension.   MEDICATIONS:  1.  Glyburide 5 mg twice daily.  2.  Toprol XL 25 mg daily.  3.  Micardis 40 mg daily.  4.  Glucophage 500 mg in the a.m.   ALLERGIES:  No known drug allergies.   SOCIAL HISTORY:  Please see detailed H&P.   FAMILY HISTORY:  Please see detailed H&P.   REVIEW OF SYSTEMS:  Please see detailed H&P.   PERTINENT PHYSICAL EXAM:  GENERAL:  Entirely unremarkable.   HOSPITAL COURSE:  The patient was admitted on the morning of his procedure,  and underwent an uneventful sigmoid colectomy.  Pathology revealed a 1.3 cm  adenocarcinoma.  All nodes negative.  His postoperative course was very  smooth.  He  was started on clear liquids on the second postoperative day,  and was able to be advanced steadily up to a regular diet which he tolerated  well.  He was discharged on Sep 13, 2004.   DISCHARGE MEDICATIONS:  Usual meds plus Darvocet p.r.n. for pain.   FOLLOW UP:  Followup is to be in my office in one week.       BTH/MEDQ  D:  10/09/2004  T:  10/09/2004  Job:  [id number]   cc:   Othelia Pulling, M.D.  [address], [address]  [place]  [place] [address]  Fax: [phone]   Georgiana Spinner, M.D.  [address]  [place]  [place] [address]  Fax: [phone]

## 2010-09-01 NOTE — Op Note (Signed)
NAME:  Jonathan Owen, Jonathan Owen             ACCOUNT NO.:  [id number]   MEDICAL RECORD NO.:  [id number]          PATIENT TYPE:  AMB   LOCATION:  ENDO                         FACILITY:  [facility]   PHYSICIAN:  Georgiana Spinner, M.D.    DATE OF BIRTH:  12/29/1927   DATE OF PROCEDURE:  10/04/2005  DATE OF DISCHARGE:                                 OPERATIVE REPORT   PROCEDURE:  Colonoscopy, with polypectomy and biopsy.   INDICATIONS:  Colon polyps, colon cancer.   ANESTHESIA:  1.  Demerol 50.  2.  Versed 5 mg.   PROCEDURE:  With the patient mildly sedated in the left lateral decubitus  position, a rectal exam was performed which was unremarkable.  Subsequently,  the Olympus videoscopic colonoscope was inserted in the rectum and passed  under direct vision to the cecum, identified by base of cecum and ileocecal  valve both, of which were photographed. From this point, the colonoscope was  slowly withdrawn, taking circumferential views of the colonic mucosa,  stopping only to at 50 cm from anal verge, at which point a polyp was seen,  photographed, and removed using snare cautery technique setting of 20/200  blended current. Tissue was retrieved for pathology.  We next stopped in the  rectum, where a second polyp was seen.  It too was photographed, and it was  removed using hot biopsy forceps technique with the same setting, and there  was good coagulation. The endoscope was placed in retroflexion to view the  anal canal from above. Internal hemorrhoids were seen photographed.  The  endoscope was straightened and withdrawn.  The patient's vital signs, and  pulse oximeter remained stable.  The patient tolerated the procedure well  with no apparent complications.   FINDINGS:  Internal hemorrhoids, polyps of rectum at 50 cm from anal verge,  otherwise an unremarkable colonoscopic examination to the cecum.   PLAN:  Await biopsy report.  The patient will call me for results and follow  up with me as an  outpatient.           ______________________________  Georgiana Spinner, M.D.     GMO/MEDQ  D:  10/04/2005  T:  10/04/2005  Job:  [id number]

## 2012-03-03 ENCOUNTER — Other Ambulatory Visit: Payer: Self-pay | Admitting: Orthopedic Surgery

## 2012-03-03 ENCOUNTER — Encounter (HOSPITAL_BASED_OUTPATIENT_CLINIC_OR_DEPARTMENT_OTHER): Payer: Self-pay | Admitting: *Deleted

## 2012-03-03 NOTE — Progress Notes (Signed)
Used to be on diabetic and htn meds-no longer on them-watches diet-no cardiac problems

## 2012-03-04 ENCOUNTER — Encounter (HOSPITAL_BASED_OUTPATIENT_CLINIC_OR_DEPARTMENT_OTHER): Payer: Self-pay | Admitting: Orthopedic Surgery

## 2012-03-04 ENCOUNTER — Encounter (HOSPITAL_BASED_OUTPATIENT_CLINIC_OR_DEPARTMENT_OTHER): Payer: Self-pay

## 2012-03-04 ENCOUNTER — Ambulatory Visit (HOSPITAL_BASED_OUTPATIENT_CLINIC_OR_DEPARTMENT_OTHER): Payer: Medicare Other

## 2012-03-04 ENCOUNTER — Encounter (HOSPITAL_BASED_OUTPATIENT_CLINIC_OR_DEPARTMENT_OTHER)
Admission: RE | Disposition: A | Discharge status: Home / Self Care | Payer: Self-pay | Source: Ambulatory Visit | Attending: Orthopedic Surgery

## 2012-03-04 ENCOUNTER — Ambulatory Visit (HOSPITAL_BASED_OUTPATIENT_CLINIC_OR_DEPARTMENT_OTHER)
Admission: RE | Admit: 2012-03-04 | Discharge: 2012-03-04 | Disposition: A | Discharge status: Home / Self Care | Payer: Medicare Other | Source: Ambulatory Visit | Attending: Orthopedic Surgery | Admitting: Orthopedic Surgery

## 2012-03-04 DIAGNOSIS — G5600 Carpal tunnel syndrome, unspecified upper limb: Secondary | ICD-10-CM | POA: Insufficient documentation

## 2012-03-04 DIAGNOSIS — E119 Type 2 diabetes mellitus without complications: Secondary | ICD-10-CM | POA: Insufficient documentation

## 2012-03-04 DIAGNOSIS — I10 Essential (primary) hypertension: Secondary | ICD-10-CM | POA: Insufficient documentation

## 2012-03-04 HISTORY — DX: Malignant (primary) neoplasm, unspecified: C80.1

## 2012-03-04 HISTORY — PX: CARPAL TUNNEL RELEASE: SHX101

## 2012-03-04 HISTORY — DX: Type 2 diabetes mellitus without complications: E11.9

## 2012-03-04 HISTORY — DX: Essential (primary) hypertension: I10

## 2012-03-04 HISTORY — DX: Presence of dental prosthetic device (complete) (partial): Z97.2

## 2012-03-04 HISTORY — DX: Presence of spectacles and contact lenses: Z97.3

## 2012-03-04 LAB — POCT HEMOGLOBIN-HEMACUE: Hemoglobin: 15.6 g/dL (ref 13.0–17.0)

## 2012-03-04 SURGERY — CARPAL TUNNEL RELEASE
Anesthesia: Regional | Site: Wrist | Laterality: Right | Wound class: Clean

## 2012-03-04 MED ORDER — MIDAZOLAM HCL 5 MG/5ML IJ SOLN
INTRAMUSCULAR | Status: DC | PRN
  Administered 2012-03-04: 1 mg via INTRAVENOUS

## 2012-03-04 MED ORDER — LACTATED RINGERS IV SOLN
INTRAVENOUS | Status: DC
  Administered 2012-03-04: 11:00:00 via INTRAVENOUS

## 2012-03-04 MED ORDER — BUPIVACAINE HCL (PF) 0.25 % IJ SOLN
INTRAMUSCULAR | Status: DC | PRN
  Administered 2012-03-04: 7 mL

## 2012-03-04 MED ORDER — CHLORHEXIDINE GLUCONATE 4 % EX LIQD
60.0000 mL | Freq: Once | CUTANEOUS | Status: AC
  Administered 2012-03-04: 4 via TOPICAL

## 2012-03-04 MED ORDER — OXYCODONE HCL 5 MG/5ML PO SOLN: 5.0000 mg | Freq: Once | ORAL | Status: DC | PRN

## 2012-03-04 MED ORDER — HYDROMORPHONE HCL PF 1 MG/ML IJ SOLN: 0.2500 mg | INTRAMUSCULAR | Status: DC | PRN

## 2012-03-04 MED ORDER — ONDANSETRON HCL 4 MG/2ML IJ SOLN
INTRAMUSCULAR | Status: DC | PRN
  Administered 2012-03-04: 4 mg via INTRAVENOUS

## 2012-03-04 MED ORDER — ONDANSETRON HCL 4 MG/2ML IJ SOLN: 4.0000 mg | Freq: Once | INTRAMUSCULAR | Status: DC | PRN

## 2012-03-04 MED ORDER — LIDOCAINE HCL (PF) 0.5 % IJ SOLN
INTRAMUSCULAR | Status: DC | PRN
  Administered 2012-03-04: 30 mL via INTRAVENOUS

## 2012-03-04 MED ORDER — CEFAZOLIN SODIUM-DEXTROSE 2-3 GM-% IV SOLR: 2.0000 g | INTRAVENOUS | Status: DC

## 2012-03-04 MED ORDER — MEPERIDINE HCL 25 MG/ML IJ SOLN: 6.2500 mg | INTRAMUSCULAR | Status: DC | PRN

## 2012-03-04 MED ORDER — FENTANYL CITRATE 0.05 MG/ML IJ SOLN
INTRAMUSCULAR | Status: DC | PRN
  Administered 2012-03-04: 50 ug via INTRAVENOUS

## 2012-03-04 MED ORDER — HYDROCODONE-ACETAMINOPHEN 5-325 MG PO TABS: 1.0000 | ORAL_TABLET | Freq: Four times a day (QID) | ORAL | Status: DC | PRN

## 2012-03-04 MED ORDER — OXYCODONE HCL 5 MG PO TABS: 5.0000 mg | ORAL_TABLET | Freq: Once | ORAL | Status: DC | PRN

## 2012-03-04 MED ORDER — PROPOFOL 10 MG/ML IV EMUL
INTRAVENOUS | Status: DC | PRN
  Administered 2012-03-04: 50 ug/kg/min via INTRAVENOUS

## 2012-03-04 SURGICAL SUPPLY — 39 items
BANDAGE GAUZE ELAST BULKY 4 IN (GAUZE/BANDAGES/DRESSINGS) ×2 IMPLANT
BLADE SURG 15 STRL LF DISP TIS (BLADE) ×1 IMPLANT
BLADE SURG 15 STRL SS (BLADE) ×1
BNDG COHESIVE 3X5 TAN STRL LF (GAUZE/BANDAGES/DRESSINGS) ×2 IMPLANT
BNDG ESMARK 4X9 LF (GAUZE/BANDAGES/DRESSINGS) IMPLANT
CHLORAPREP W/TINT 26ML (MISCELLANEOUS) ×2 IMPLANT
CLOTH BEACON ORANGE TIMEOUT ST (SAFETY) ×2 IMPLANT
CORDS BIPOLAR (ELECTRODE) ×2 IMPLANT
COVER MAYO STAND STRL (DRAPES) ×2 IMPLANT
COVER TABLE BACK 60X90 (DRAPES) ×2 IMPLANT
CUFF TOURNIQUET SINGLE 18IN (TOURNIQUET CUFF) ×2 IMPLANT
DRAPE EXTREMITY T 121X128X90 (DRAPE) ×2 IMPLANT
DRAPE SURG 17X23 STRL (DRAPES) ×2 IMPLANT
DRSG KUZMA FLUFF (GAUZE/BANDAGES/DRESSINGS) ×2 IMPLANT
GAUZE XEROFORM 1X8 LF (GAUZE/BANDAGES/DRESSINGS) ×2 IMPLANT
GLOVE BIO SURGEON STRL SZ 6.5 (GLOVE) ×4 IMPLANT
GLOVE BIO SURGEON STRL SZ7.5 (GLOVE) ×2 IMPLANT
GLOVE BIOGEL PI IND STRL 7.0 (GLOVE) ×1 IMPLANT
GLOVE BIOGEL PI IND STRL 8.5 (GLOVE) ×1 IMPLANT
GLOVE BIOGEL PI INDICATOR 7.0 (GLOVE) ×1
GLOVE BIOGEL PI INDICATOR 8.5 (GLOVE) ×1
GLOVE SURG ORTHO 8.0 STRL STRW (GLOVE) ×4 IMPLANT
GOWN BRE IMP PREV XXLGXLNG (GOWN DISPOSABLE) ×4 IMPLANT
GOWN PREVENTION PLUS XLARGE (GOWN DISPOSABLE) ×4 IMPLANT
NEEDLE 27GAX1X1/2 (NEEDLE) ×2 IMPLANT
NS IRRIG 1000ML POUR BTL (IV SOLUTION) ×2 IMPLANT
PACK BASIN DAY SURGERY FS (CUSTOM PROCEDURE TRAY) ×2 IMPLANT
PAD CAST 3X4 CTTN HI CHSV (CAST SUPPLIES) ×1 IMPLANT
PADDING CAST ABS 4INX4YD NS (CAST SUPPLIES) ×1
PADDING CAST ABS COTTON 4X4 ST (CAST SUPPLIES) ×1 IMPLANT
PADDING CAST COTTON 3X4 STRL (CAST SUPPLIES) ×1
SPONGE GAUZE 4X4 12PLY (GAUZE/BANDAGES/DRESSINGS) ×2 IMPLANT
STOCKINETTE 4X48 STRL (DRAPES) ×2 IMPLANT
SUT VICRYL 4-0 PS2 18IN ABS (SUTURE) IMPLANT
SUT VICRYL RAPIDE 4/0 PS 2 (SUTURE) ×2 IMPLANT
SYR BULB 3OZ (MISCELLANEOUS) ×2 IMPLANT
SYR CONTROL 10ML LL (SYRINGE) ×2 IMPLANT
TOWEL OR 17X24 6PK STRL BLUE (TOWEL DISPOSABLE) ×2 IMPLANT
UNDERPAD 30X30 INCONTINENT (UNDERPADS AND DIAPERS) ×2 IMPLANT

## 2012-03-04 NOTE — Anesthesia Preprocedure Evaluation (Signed)
Anesthesia Evaluation  Patient identified by MRN, date of birth, ID band Patient awake    Reviewed: Allergy & Precautions, H&P , NPO status , Patient's Chart, lab work & pertinent test results  Airway Mallampati: I TM Distance: >3 FB Neck ROM: Full    Dental   Pulmonary          Cardiovascular hypertension, Pt. on medications     Neuro/Psych    GI/Hepatic   Endo/Other  diabetes  Renal/GU      Musculoskeletal   Abdominal   Peds  Hematology   Anesthesia Other Findings   Reproductive/Obstetrics                           Anesthesia Physical Anesthesia Plan  ASA: II  Anesthesia Plan: Bier Block   Post-op Pain Management:    Induction: Intravenous  Airway Management Planned: Natural Airway  Additional Equipment:   Intra-op Plan:   Post-operative Plan:   Informed Consent: I have reviewed the patients History and Physical, chart, labs and discussed the procedure including the risks, benefits and alternatives for the proposed anesthesia with the patient or authorized representative who has indicated his/her understanding and acceptance.     Plan Discussed with: CRNA and Surgeon  Anesthesia Plan Comments:         Anesthesia Quick Evaluation

## 2012-03-04 NOTE — Brief Op Note (Signed)
03/04/2012  1:51 PM  PATIENT:  Garry Heater Aldava  78 y.o. male  PRE-OPERATIVE DIAGNOSIS:  CTS RIGHT  POST-OPERATIVE DIAGNOSIS:  CTS RIGHT  PROCEDURE:  Procedure(s) (LRB) with comments: CARPAL TUNNEL RELEASE (Right)  SURGEON:  Surgeon(s) and Role:    * Nicki Reaper, MD - Primary    * Tami Ribas, MD - Assisting  PHYSICIAN ASSISTANT:   ASSISTANTS: none   ANESTHESIA:   local and regional  EBL:     BLOOD ADMINISTERED:none  DRAINS: none   LOCAL MEDICATIONS USED:  MARCAINE     SPECIMEN:  No Specimen  DISPOSITION OF SPECIMEN:  N/A  COUNTS:  YES  TOURNIQUET:   Total Tourniquet Time Documented: Forearm (Right) - 22 minutes  DICTATION: .Other Dictation: Dictation Number [id number]  PLAN OF CARE: Discharge to home after PACU  PATIENT DISPOSITION:  PACU - hemodynamically stable.

## 2012-03-04 NOTE — Transfer of Care (Signed)
Immediate Anesthesia Transfer of Care Note  Patient: Jonathan Owen  Procedure(s) Performed: Procedure(s) (LRB) with comments: CARPAL TUNNEL RELEASE (Right)  Patient Location: PACU  Anesthesia Type:Bier block  Level of Consciousness: awake and alert   Airway & Oxygen Therapy: Patient Spontanous Breathing and Patient connected to face mask oxygen  Post-op Assessment: Report given to PACU RN and Post -op Vital signs reviewed and stable  Post vital signs: Reviewed and stable  Complications: No apparent anesthesia complications

## 2012-03-04 NOTE — Op Note (Signed)
Dictation Number [id number]

## 2012-03-04 NOTE — Anesthesia Postprocedure Evaluation (Signed)
Anesthesia Post Note  Patient: Jonathan Owen  Procedure(s) Performed: Procedure(s) (LRB): CARPAL TUNNEL RELEASE (Right)  Anesthesia type: general  Patient location: PACU  Post pain: Pain level controlled  Post assessment: Patient's Cardiovascular Status Stable  Last Vitals:  Filed Vitals:   03/04/12 1430  BP: 100/53  Pulse:   Temp:   Resp:     Post vital signs: Reviewed and stable  Level of consciousness: sedated  Complications: No apparent anesthesia complications

## 2012-03-04 NOTE — H&P (Signed)
Jonathan Owen is an 78 year-old right-hand dominant male who comes in complaining of numbness, tingling in his right hand. It began in his index, but has moved all the way to his ring finger. This has been going on for several years, increasing over the past six months. He states he has problems with small objects. He has no history of injury to the hand or to the neck.  He complains of a severe aching pain with the numbness.  It awakens him 7 out of 7 nights.  He has history of diabetes, no history of thyroid problems, arthritis or gout.  This has not significantly changed. He has not taken anything for this.  He is not complaining of his left side.  He has had his nerve conductions done revealing carpal tunnel syndrome bilaterally with a motor delay of 11.4 on the right, 4.8 on the left, sensory delay of 4.7 on the right and 2.6 on the left. He has an amplitude diminution of 2 on the right and 20.7 on the left.    ALLERGIES:    None.  MEDICATIONS:     He takes no medicines.    SURGICAL HISTORY:      He has had colon cancer.  FAMILY MEDICAL HISTORY:   Positive for diabetes, otherwise negative.  SOCIAL HISTORY:      He does not smoke or drink.    REVIEW OF SYSTEMS:     Positive for glasses, otherwise negative 14 points.  Jonathan Owen is an 78 y.o. male.   Chief Complaint: CTS RT HPI:  See above   Past Medical History  Diagnosis Date  . Hypertension     no meds now-  . Diabetes mellitus without complication     no meds-watches diet  . Cancer     hx colon cancer  . Wears glasses   . Wears dentures     upper-partial bottom    Past Surgical History  Procedure Date  . Sternotomy 2006    medial-thymectomy  . Partial colectomy 2006    sigmoid  . Colonoscopy   . Colonoscopy w/ biopsies and polypectomy   . Upper gastrointestinal endoscopy 2006    No family history on file. Social History:  reports that he has never smoked. He does not have any smokeless tobacco history on  file. He reports that he does not drink alcohol. His drug history not on file.  Allergies: No Known Allergies  No prescriptions prior to admission    No results found for this or any previous visit (from the past 48 hour(s)).  No results found.   Pertinent items are noted in HPI.  Height 5\' 8"  (1.727 m), weight 86.183 kg (190 lb).  General appearance: alert, cooperative and appears stated age Head: Normocephalic, without obvious abnormality Neck: no adenopathy Resp: clear to auscultation bilaterally Cardio: regular rate and rhythm, S1, S2 normal, no murmur, click, rub or gallop GI: soft, non-tender; bowel sounds normal; no masses,  no organomegaly Extremities: extremities normal, atraumatic, no cyanosis or edema Pulses: 2+ and symmetric Skin: Skin color, texture, turgor normal. No rashes or lesions Neurologic: Grossly normal Incision/Wound: na  Assessment/Plan   We have discussed the possibility of surgical intervention of his right carpal tunnel. The pre, peri and post op course are discussed along with risks and complications. He is aware there is no guarantee with surgery, possibility of infection, recurrence, injury to arteries, nerves and tendons, incomplete relief of symptoms and dystrophy.  He would like  to proceed to have this done. This will be scheduled as an outpatient under regional anesthesia for right carpal tunnel release.  Meka Lewan R 03/04/2012, 8:42 AM

## 2012-03-05 ENCOUNTER — Encounter (HOSPITAL_BASED_OUTPATIENT_CLINIC_OR_DEPARTMENT_OTHER): Payer: Self-pay | Admitting: Orthopedic Surgery

## 2012-03-05 NOTE — Op Note (Signed)
NAME:  Jonathan Owen, Jonathan Owen             ACCOUNT NO.:  [id number]  MEDICAL RECORD NO.:  [id number]  LOCATION:                                 FACILITY:  PHYSICIAN:  Cindee Salt, M.D.            DATE OF BIRTH:  DATE OF PROCEDURE:  03/04/2012 DATE OF DISCHARGE:                              OPERATIVE REPORT   PREOPERATIVE DIAGNOSIS:  Carpal tunnel syndrome, right hand.  POSTOPERATIVE DIAGNOSIS:  Carpal tunnel syndrome, right hand.  OPERATION:  Decompression right median nerve.  SURGEON:  Cindee Salt, MD.  ASSISTANT:  Betha Loa, MD  ANESTHESIA:  Forearm based IV regional with local infiltration.  ANESTHESIOLOGIST:  Kaylyn Layer. Michelle Piper, MD  HISTORY:  The patient is an 78 year old male with a history of carpal tunnel syndrome.  Nerve conduction is positive.  This has not responded to conservative treatment, and he has elected to undergo surgical decompression.  Pre, peri, and postoperative course have been discussed along with risks and complications.  He is aware that there is no guarantee with the surgery, possibility of infection, recurrence of injury to arteries, nerves, tendons, incomplete relief of symptoms and dystrophy.  In the preoperative area, the patient is seen, the extremity marked by both the patient and surgeon, antibiotic given.  PROCEDURE:  The patient was brought to the operating room where a forearm-based IV regional anesthetic was carried out without difficulty. He was prepped using ChloraPrep, supine position, right arm free.  A 3- minute dry time was allowed.  Time-out taken confirming the patient and procedure.  A longitudinal incision was made in the palm, carried down through subcutaneous tissue.  Bleeders were electrocauterized with bipolar.  Palmar fascia was split.  Superficial palmar arch identified. Flexor tendon of the ring and little finger identified.  To the ulnar side of median nerve, the carpal retinaculum was incised with sharp dissection.  Right  angle and Sewall retractor were placed between skin and forearm fascia.  The fascia was released for approximately a centimeter and half proximal to the wrist crease under direct vision. Canal was explored.  Area of compression to the nerve was apparent.  No further lesions were identified.  The wound was irrigated.  The skin then closed with interrupted 4-0 Vicryl Rapide sutures.  A sterile compressive dressing was applied with the fingers free after a local infiltration with 0.25% Marcaine without epinephrine, 6 mL was used.  On deflation of the tourniquet, all fingers immediately pinked.  He was taken to the recovery room for observation in satisfactory condition.  He will be discharged home on Vicodin.          ______________________________ Cindee Salt, M.D.     GK/MEDQ  D:  03/04/2012  T:  03/05/2012  Job:  [id number]

## 2012-08-13 ENCOUNTER — Emergency Department (HOSPITAL_COMMUNITY)
Admission: EM | Admit: 2012-08-13 | Discharge: 2012-08-13 | Disposition: A | Discharge status: Home / Self Care | Payer: Medicare Other | Attending: Emergency Medicine | Admitting: Emergency Medicine

## 2012-08-13 ENCOUNTER — Encounter (HOSPITAL_COMMUNITY): Payer: Self-pay | Admitting: Emergency Medicine

## 2012-08-13 ENCOUNTER — Emergency Department (HOSPITAL_COMMUNITY): Payer: Medicare Other

## 2012-08-13 DIAGNOSIS — R0602 Shortness of breath: Secondary | ICD-10-CM | POA: Insufficient documentation

## 2012-08-13 DIAGNOSIS — R079 Chest pain, unspecified: Secondary | ICD-10-CM | POA: Insufficient documentation

## 2012-08-13 DIAGNOSIS — Z79899 Other long term (current) drug therapy: Secondary | ICD-10-CM | POA: Insufficient documentation

## 2012-08-13 DIAGNOSIS — Z7982 Long term (current) use of aspirin: Secondary | ICD-10-CM | POA: Insufficient documentation

## 2012-08-13 DIAGNOSIS — Z85038 Personal history of other malignant neoplasm of large intestine: Secondary | ICD-10-CM | POA: Insufficient documentation

## 2012-08-13 DIAGNOSIS — E119 Type 2 diabetes mellitus without complications: Secondary | ICD-10-CM | POA: Insufficient documentation

## 2012-08-13 DIAGNOSIS — I10 Essential (primary) hypertension: Secondary | ICD-10-CM | POA: Insufficient documentation

## 2012-08-13 LAB — CBC
MCH: 30.8 pg (ref 26.0–34.0)
MCHC: 35.2 g/dL (ref 30.0–36.0)
Platelets: 259 10*3/uL (ref 150–400)
RBC: 4.83 MIL/uL (ref 4.22–5.81)
RDW: 12.8 % (ref 11.5–15.5)

## 2012-08-13 LAB — PRO B NATRIURETIC PEPTIDE: Pro B Natriuretic peptide (BNP): 29.5 pg/mL (ref 0–450)

## 2012-08-13 LAB — D-DIMER, QUANTITATIVE: D-Dimer, Quant: 0.3 ug/mL-FEU (ref 0.00–0.48)

## 2012-08-13 LAB — COMPREHENSIVE METABOLIC PANEL
ALT: 12 U/L (ref 0–53)
AST: 14 U/L (ref 0–37)
Albumin: 3.8 g/dL (ref 3.5–5.2)
Calcium: 9.9 mg/dL (ref 8.4–10.5)
Creatinine, Ser: 0.63 mg/dL (ref 0.50–1.35)
Sodium: 134 mEq/L — ABNORMAL LOW (ref 135–145)
Total Protein: 6.7 g/dL (ref 6.0–8.3)

## 2012-08-13 MED ORDER — ACETAMINOPHEN 325 MG PO TABS
650.0000 mg | ORAL_TABLET | Freq: Once | ORAL | Status: AC
  Administered 2012-08-13: 650 mg via ORAL
  Filled 2012-08-13: qty 2

## 2012-08-13 MED ORDER — SODIUM CHLORIDE 0.9 % IV SOLN
INTRAVENOUS | Status: DC
  Administered 2012-08-13: 20 mL/h via INTRAVENOUS

## 2012-08-13 NOTE — ED Notes (Signed)
Reported to Dr. Denton Lank that pt is having some "heartburn"

## 2012-08-13 NOTE — ED Notes (Signed)
Pt states that yesterday he felt a little sob but attributed that to pollen.  States that he woke up at 3 am and states that it felt like "someone was laying on his heart".  Denies NVD.  Had a growth over his heart 10 years ago that was removed.  Denies any other heart issues.  Hx of HTN.

## 2012-08-13 NOTE — ED Provider Notes (Signed)
History     CSN: [id number]  Arrival date & time 08/13/12  0915   First MD Initiated Contact with Patient 08/13/12 [phone]      Chief Complaint  Patient presents with  . Chest Pain    (Consider location/radiation/quality/duration/timing/severity/associated sxs/prior treatment) Patient is a 79 y.o. male presenting with chest pain. The history is provided by the patient.  Chest Pain Associated symptoms: shortness of breath   Associated symptoms: no abdominal pain, no back pain, no cough, no fever, no headache and no palpitations   pt with hx htn, c/o chest pain for the past couple days. States felt dull pain/mild tightness mid chest/shoulder area. Constant. Dull. Non radiating. Notes mild sob/mild doe, noted a couple days ago when mowing yard. Denies cough or uri c/o. No fever or chills. Denies personal or family hx cad. States remote hx stress test negative. No leg pain or swelling, no immobility, recent travel/trauma or surgery, no hemoptysis, no hx dvt or pe. No orthopnea or pnd. No hx chf. No recent med change. Pt denies recent wt change. Pt questions whether symptoms related to heavy pollen - states feels congested.     Past Medical History  Diagnosis Date  . Hypertension     no meds now-  . Diabetes mellitus without complication     no meds-watches diet  . Cancer     hx colon cancer  . Wears glasses   . Wears dentures     upper-partial bottom    Past Surgical History  Procedure Laterality Date  . Sternotomy  2006    medial-thymectomy  . Partial colectomy  2006    sigmoid  . Colonoscopy    . Colonoscopy w/ biopsies and polypectomy    . Upper gastrointestinal endoscopy  2006  . Carpal tunnel release  03/04/2012    Procedure: CARPAL TUNNEL RELEASE;  Surgeon: Nicki Reaper, MD;  Location: [facility];  Service: Orthopedics;  Laterality: Right;    No family history on file.  History  Substance Use Topics  . Smoking status: Never Smoker   . Smokeless  tobacco: Not on file  . Alcohol Use: No      Review of Systems  Constitutional: Negative for fever and chills.  HENT: Negative for neck pain.   Eyes: Negative for redness.  Respiratory: Positive for shortness of breath. Negative for cough.   Cardiovascular: Positive for chest pain. Negative for palpitations and leg swelling.  Gastrointestinal: Negative for abdominal pain and blood in stool.  Genitourinary: Negative for flank pain.  Musculoskeletal: Negative for back pain.  Skin: Negative for rash.  Neurological: Negative for headaches.  Hematological: Does not bruise/bleed easily.  Psychiatric/Behavioral: Negative for confusion.    Allergies  Review of patient's allergies indicates no known allergies.  Home Medications   Current Outpatient Rx  Name  Route  Sig  Dispense  Refill  . aspirin 81 MG tablet   Oral   Take 81 mg by mouth daily.         Marland Kitchen HYDROcodone-acetaminophen (NORCO) 5-325 MG per tablet   Oral   Take 1 tablet by mouth every 6 (six) hours as needed for pain.   30 tablet   0   . LORazepam (ATIVAN) 1 MG tablet   Oral   Take 1 mg by mouth at bedtime as needed.           BP 153/81  Pulse 102  Temp(Src) 98.2 F (36.8 C) (Oral)  Resp 18  SpO2  98%  Physical Exam  Nursing note and vitals reviewed. Constitutional: He is oriented to person, place, and time. He appears well-developed and well-nourished. No distress.  HENT:  Mouth/Throat: Oropharynx is clear and moist.  Eyes: Conjunctivae are normal. No scleral icterus.  Neck: Neck supple. No JVD present. No tracheal deviation present. No thyromegaly present.  Cardiovascular: Normal rate, regular rhythm, normal heart sounds and intact distal pulses.  Exam reveals no gallop and no friction rub.   No murmur heard. Pulmonary/Chest: Effort normal and breath sounds normal. No accessory muscle usage. No respiratory distress. He exhibits no tenderness.  Abdominal: Soft. Bowel sounds are normal. He exhibits no  distension. There is no tenderness.  Musculoskeletal: Normal range of motion. He exhibits no edema and no tenderness.  Neurological: He is alert and oriented to person, place, and time.  Skin: Skin is warm and dry.  Psychiatric: He has a normal mood and affect.    ED Course  Procedures (including critical care time)   Results for orders placed during the hospital encounter of 08/13/12  D-DIMER, QUANTITATIVE      Result Value Range   D-Dimer, Quant 0.30  0.00 - 0.48 ug/mL-FEU  PRO B NATRIURETIC PEPTIDE      Result Value Range   Pro B Natriuretic peptide (BNP) 29.5  0 - 450 pg/mL  TROPONIN I      Result Value Range   Troponin I <0.30  <0.30 ng/mL  CBC      Result Value Range   WBC 5.8  4.0 - 10.5 K/uL   RBC 4.83  4.22 - 5.81 MIL/uL   Hemoglobin 14.9  13.0 - 17.0 g/dL   HCT 95.6  21.3 - 08.6 %   MCV 87.6  78.0 - 100.0 fL   MCH 30.8  26.0 - 34.0 pg   MCHC 35.2  30.0 - 36.0 g/dL   RDW 57.8  46.9 - 62.9 %   Platelets 259  150 - 400 K/uL  COMPREHENSIVE METABOLIC PANEL      Result Value Range   Sodium 134 (*) 135 - 145 mEq/L   Potassium 3.9  3.5 - 5.1 mEq/L   Chloride 98  96 - 112 mEq/L   CO2 24  19 - 32 mEq/L   Glucose, Bld 168 (*) 70 - 99 mg/dL   BUN 12  6 - 23 mg/dL   Creatinine, Ser 5.28  0.50 - 1.35 mg/dL   Calcium 9.9  8.4 - 41.3 mg/dL   Total Protein 6.7  6.0 - 8.3 g/dL   Albumin 3.8  3.5 - 5.2 g/dL   AST 14  0 - 37 U/L   ALT 12  0 - 53 U/L   Alkaline Phosphatase 50  39 - 117 U/L   Total Bilirubin 1.2  0.3 - 1.2 mg/dL   GFR calc non Af Amer 89 (*) >90 mL/min   GFR calc Af Amer >90  >90 mL/min  TROPONIN I      Result Value Range   Troponin I <0.30  <0.30 ng/mL   Dg Chest 2 View  08/13/2012  *RADIOLOGY REPORT*  Clinical Data: Chest pain and epigastric pain.  CHEST - 2 VIEW  Comparison: 03/19/2007.  Findings: The cardiac silhouette, mediastinal and hilar contours are within normal limits and stable.  The lungs are clear. Prominent epicardial fat noted on the left.   No pleural effusion. The bony thorax is intact.  IMPRESSION: No acute cardiopulmonary findings.   Original Report Authenticated By: Rudie Meyer,  M.D.       MDM  Iv ns. Labs. Cxr.  Reviewed nursing notes and prior charts for additional history.    Date: 08/13/2012  Rate: 99  Rhythm: normal sinus rhythm  QRS Axis: normal  Intervals: normal  ST/T Wave abnormalities: normal  Conduction Disutrbances:none  Narrative Interpretation:   Old EKG Reviewed: none available  Pt symptoms present/constant for > 24 hrs after which troponin x 2 normal.   cxr neg. ddimer neg.    recheck pt states feels ready for d/c.  Discussed labs, xrays, etc. Also discussed plan for close outpt card f/u, possible stress/echo - pt agreeable w plan.  Pt appears stable for d/c.   Recheck hr 86 rr 16 pulse ox 99%, afeb.         Suzi Roots, MD 08/13/12 [phone]

## 2012-08-13 NOTE — ED Notes (Signed)
MD at bedside. 

## 2012-08-14 ENCOUNTER — Encounter: Payer: Medicare Other | Admitting: Cardiovascular Disease

## 2012-08-25 ENCOUNTER — Other Ambulatory Visit: Payer: Self-pay | Admitting: Orthopedic Surgery

## 2012-09-01 ENCOUNTER — Encounter (HOSPITAL_BASED_OUTPATIENT_CLINIC_OR_DEPARTMENT_OTHER): Payer: Self-pay | Admitting: *Deleted

## 2012-09-01 NOTE — Progress Notes (Signed)
Very active man-was here 11/13 for rt ctr-did well-no labs needed

## 2012-09-09 ENCOUNTER — Ambulatory Visit (HOSPITAL_BASED_OUTPATIENT_CLINIC_OR_DEPARTMENT_OTHER): Payer: Medicare Other | Admitting: *Deleted

## 2012-09-09 ENCOUNTER — Ambulatory Visit (HOSPITAL_BASED_OUTPATIENT_CLINIC_OR_DEPARTMENT_OTHER)
Admission: RE | Admit: 2012-09-09 | Discharge: 2012-09-09 | Disposition: A | Discharge status: Home / Self Care | Payer: Medicare Other | Source: Ambulatory Visit | Attending: Orthopedic Surgery | Admitting: Orthopedic Surgery

## 2012-09-09 ENCOUNTER — Encounter (HOSPITAL_BASED_OUTPATIENT_CLINIC_OR_DEPARTMENT_OTHER): Payer: Self-pay | Admitting: *Deleted

## 2012-09-09 ENCOUNTER — Encounter (HOSPITAL_BASED_OUTPATIENT_CLINIC_OR_DEPARTMENT_OTHER)
Admission: RE | Disposition: A | Discharge status: Home / Self Care | Payer: Self-pay | Source: Ambulatory Visit | Attending: Orthopedic Surgery

## 2012-09-09 ENCOUNTER — Encounter (HOSPITAL_BASED_OUTPATIENT_CLINIC_OR_DEPARTMENT_OTHER): Payer: Self-pay | Admitting: Orthopedic Surgery

## 2012-09-09 DIAGNOSIS — M19049 Primary osteoarthritis, unspecified hand: Secondary | ICD-10-CM | POA: Insufficient documentation

## 2012-09-09 DIAGNOSIS — I10 Essential (primary) hypertension: Secondary | ICD-10-CM | POA: Insufficient documentation

## 2012-09-09 DIAGNOSIS — E119 Type 2 diabetes mellitus without complications: Secondary | ICD-10-CM | POA: Insufficient documentation

## 2012-09-09 DIAGNOSIS — Z85038 Personal history of other malignant neoplasm of large intestine: Secondary | ICD-10-CM | POA: Insufficient documentation

## 2012-09-09 DIAGNOSIS — G5600 Carpal tunnel syndrome, unspecified upper limb: Secondary | ICD-10-CM | POA: Insufficient documentation

## 2012-09-09 HISTORY — PX: CARPAL TUNNEL RELEASE: SHX101

## 2012-09-09 LAB — POCT HEMOGLOBIN-HEMACUE: Hemoglobin: 15.5 g/dL (ref 13.0–17.0)

## 2012-09-09 SURGERY — CARPAL TUNNEL RELEASE
Anesthesia: Regional | Site: Wrist | Laterality: Left | Wound class: Clean

## 2012-09-09 MED ORDER — FENTANYL CITRATE 0.05 MG/ML IJ SOLN
INTRAMUSCULAR | Status: DC | PRN
  Administered 2012-09-09: 75 ug via INTRAVENOUS

## 2012-09-09 MED ORDER — FENTANYL CITRATE 0.05 MG/ML IJ SOLN
25.0000 ug | INTRAMUSCULAR | Status: DC | PRN
Start: 2012-09-09 — End: 2012-09-09

## 2012-09-09 MED ORDER — LIDOCAINE HCL (PF) 0.5 % IJ SOLN
INTRAMUSCULAR | Status: DC | PRN
  Administered 2012-09-09: 30 mL via INTRAVENOUS

## 2012-09-09 MED ORDER — PROPOFOL 10 MG/ML IV BOLUS
INTRAVENOUS | Status: DC | PRN
  Administered 2012-09-09: 20 mg via INTRAVENOUS

## 2012-09-09 MED ORDER — CHLORHEXIDINE GLUCONATE 4 % EX LIQD: 60.0000 mL | Freq: Once | CUTANEOUS | Status: DC

## 2012-09-09 MED ORDER — LACTATED RINGERS IV SOLN
INTRAVENOUS | Status: DC
  Administered 2012-09-09: 07:00:00 via INTRAVENOUS

## 2012-09-09 MED ORDER — BUPIVACAINE HCL (PF) 0.25 % IJ SOLN
INTRAMUSCULAR | Status: DC | PRN
  Administered 2012-09-09: 7 mL

## 2012-09-09 MED ORDER — CEFAZOLIN SODIUM-DEXTROSE 2-3 GM-% IV SOLR
2.0000 g | INTRAVENOUS | Status: AC
  Administered 2012-09-09: 2 g via INTRAVENOUS

## 2012-09-09 MED ORDER — MIDAZOLAM HCL 2 MG/2ML IJ SOLN: 1.0000 mg | INTRAMUSCULAR | Status: DC | PRN

## 2012-09-09 MED ORDER — ONDANSETRON HCL 4 MG/2ML IJ SOLN
INTRAMUSCULAR | Status: DC | PRN
  Administered 2012-09-09: 4 mg via INTRAVENOUS

## 2012-09-09 MED ORDER — FENTANYL CITRATE 0.05 MG/ML IJ SOLN: 50.0000 ug | INTRAMUSCULAR | Status: DC | PRN

## 2012-09-09 MED ORDER — ONDANSETRON HCL 4 MG/2ML IJ SOLN: 4.0000 mg | Freq: Once | INTRAMUSCULAR | Status: DC | PRN

## 2012-09-09 SURGICAL SUPPLY — 38 items
BANDAGE GAUZE ELAST BULKY 4 IN (GAUZE/BANDAGES/DRESSINGS) ×2 IMPLANT
BLADE SURG 15 STRL LF DISP TIS (BLADE) ×1 IMPLANT
BLADE SURG 15 STRL SS (BLADE) ×1
BNDG COHESIVE 3X5 TAN STRL LF (GAUZE/BANDAGES/DRESSINGS) ×2 IMPLANT
BNDG ESMARK 4X9 LF (GAUZE/BANDAGES/DRESSINGS) IMPLANT
CHLORAPREP W/TINT 26ML (MISCELLANEOUS) ×2 IMPLANT
CLOTH BEACON ORANGE TIMEOUT ST (SAFETY) ×2 IMPLANT
CORDS BIPOLAR (ELECTRODE) ×2 IMPLANT
COVER MAYO STAND STRL (DRAPES) ×2 IMPLANT
COVER TABLE BACK 60X90 (DRAPES) ×2 IMPLANT
CUFF TOURNIQUET SINGLE 18IN (TOURNIQUET CUFF) ×2 IMPLANT
DRAPE EXTREMITY T 121X128X90 (DRAPE) ×2 IMPLANT
DRAPE SURG 17X23 STRL (DRAPES) ×2 IMPLANT
DRSG KUZMA FLUFF (GAUZE/BANDAGES/DRESSINGS) ×2 IMPLANT
GAUZE XEROFORM 1X8 LF (GAUZE/BANDAGES/DRESSINGS) ×2 IMPLANT
GLOVE BIO SURGEON STRL SZ 6.5 (GLOVE) ×4 IMPLANT
GLOVE BIOGEL PI IND STRL 7.0 (GLOVE) ×2 IMPLANT
GLOVE BIOGEL PI IND STRL 8.5 (GLOVE) ×1 IMPLANT
GLOVE BIOGEL PI INDICATOR 7.0 (GLOVE) ×2
GLOVE BIOGEL PI INDICATOR 8.5 (GLOVE) ×1
GLOVE SURG ORTHO 8.0 STRL STRW (GLOVE) ×2 IMPLANT
GOWN BRE IMP PREV XXLGXLNG (GOWN DISPOSABLE) ×2 IMPLANT
GOWN PREVENTION PLUS XLARGE (GOWN DISPOSABLE) ×4 IMPLANT
NEEDLE 27GAX1X1/2 (NEEDLE) ×2 IMPLANT
NS IRRIG 1000ML POUR BTL (IV SOLUTION) ×2 IMPLANT
PACK BASIN DAY SURGERY FS (CUSTOM PROCEDURE TRAY) ×2 IMPLANT
PAD CAST 3X4 CTTN HI CHSV (CAST SUPPLIES) ×1 IMPLANT
PADDING CAST ABS 4INX4YD NS (CAST SUPPLIES) ×1
PADDING CAST ABS COTTON 4X4 ST (CAST SUPPLIES) ×1 IMPLANT
PADDING CAST COTTON 3X4 STRL (CAST SUPPLIES) ×1
SPONGE GAUZE 4X4 12PLY (GAUZE/BANDAGES/DRESSINGS) ×2 IMPLANT
STOCKINETTE 4X48 STRL (DRAPES) ×2 IMPLANT
SUT VICRYL 4-0 PS2 18IN ABS (SUTURE) IMPLANT
SUT VICRYL RAPIDE 4/0 PS 2 (SUTURE) ×2 IMPLANT
SYR BULB 3OZ (MISCELLANEOUS) ×2 IMPLANT
SYR CONTROL 10ML LL (SYRINGE) ×2 IMPLANT
TOWEL OR 17X24 6PK STRL BLUE (TOWEL DISPOSABLE) ×2 IMPLANT
UNDERPAD 30X30 INCONTINENT (UNDERPADS AND DIAPERS) ×2 IMPLANT

## 2012-09-09 NOTE — Anesthesia Postprocedure Evaluation (Signed)
  Anesthesia Post-op Note  Patient: Jonathan Owen  Procedure(s) Performed: Procedure(s): CARPAL TUNNEL RELEASE (Left)  Patient Location: PACU  Anesthesia Type:Bier block  Level of Consciousness: awake, alert  and oriented  Airway and Oxygen Therapy: Patient Spontanous Breathing  Post-op Pain: mild  Post-op Assessment: Post-op Vital signs reviewed  Post-op Vital Signs: Reviewed  Complications: No apparent anesthesia complications

## 2012-09-09 NOTE — Op Note (Signed)
Dictation Number [id number]

## 2012-09-09 NOTE — Anesthesia Procedure Notes (Signed)
Procedure Name: MAC Date/Time: 09/09/2012 8:37 AM Performed by: Meyer Russel Pre-anesthesia Checklist: Patient identified, Emergency Drugs available, Suction available and Patient being monitored Patient Re-evaluated:Patient Re-evaluated prior to inductionOxygen Delivery Method: Simple face mask Preoxygenation: Pre-oxygenation with 100% oxygen Intubation Type: IV induction Dental Injury: Teeth and Oropharynx as per pre-operative assessment

## 2012-09-09 NOTE — Transfer of Care (Signed)
Immediate Anesthesia Transfer of Care Note  Patient: Jonathan Owen  Procedure(s) Performed: Procedure(s): CARPAL TUNNEL RELEASE (Left)  Patient Location: PACU  Anesthesia Type:Bier block  Level of Consciousness: awake, alert  and oriented  Airway & Oxygen Therapy: Patient Spontanous Breathing, Sat 99% RA  Post-op Assessment: Report given to PACU RN, Post -op Vital signs reviewed and stable and Patient moving all extremities  Post vital signs: Reviewed and stable  Complications: No apparent anesthesia complications

## 2012-09-09 NOTE — H&P (Signed)
  Jonathan Owen is 47 with bilateral carpal tunnel syndrome. He has undergone release of his right. 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 The carpal tunnel release on his right side has done very well and he desires proceeding with the left side. His nerve conductions are positive with a motor delay of 4.8 on the left, 2.6. These were much worse on his right side. His major complaint at the present time is decreased strength.   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 He desires proceeding to have left carpal tunnel release done without doing anything to the arthritis to which we would agree.  PAST MEDICAL HISTORY: He has no known drug allergies. He is currently taking no medications. He has had colon cancer.  FAMILY H ISTORY: Positive for diabetes, otherwise negative.  SOCIAL HISTORY: He does not smoke or drink. He is divorced.  REVIEW OF SYSTEMS: Positive for glasses, otherwise negative for 14 points.  Jonathan Owen is an 79 y.o. male.   Chief Complaint: CTS left HPI: see above  Past Medical History  Diagnosis Date  . Hypertension     no meds now-  . Diabetes mellitus without complication     no meds-watches diet  . Cancer     hx colon cancer  . Wears glasses   . Wears dentures     upper-partial bottom    Past Surgical History  Procedure Laterality Date  . Sternotomy  2006    medial-thymectomy  . Partial colectomy  2006    sigmoid  . Colonoscopy    . Colonoscopy w/ biopsies and polypectomy    . Upper gastrointestinal endoscopy  2006  . Carpal tunnel release  03/04/2012    Procedure: CARPAL TUNNEL RELEASE;  Surgeon: Nicki Reaper, MD;  Location: [facility];  Service: Orthopedics;  Laterality: Right;    History reviewed. No pertinent family history. Social History:  reports that he has never smoked. He does not have any smokeless tobacco history on file. He reports that he does  not drink alcohol. His drug history is not on file.  Allergies: No Known Allergies  No prescriptions prior to admission    No results found for this or any previous visit (from the past 48 hour(s)).  No results found.   Pertinent items are noted in HPI.  Height 5\' 11"  (1.803 m), weight 88.451 kg (195 lb).  General appearance: alert, cooperative and appears stated age Head: Normocephalic, without obvious abnormality Neck: no JVD Resp: clear to auscultation bilaterally Cardio: regular rate and rhythm, S1, S2 normal, no murmur, click, rub or gallop GI: soft, non-tender; bowel sounds normal; no masses,  no organomegaly Extremities: extremities normal, atraumatic, no cyanosis or edema Pulses: 2+ and symmetric Skin: Skin color, texture, turgor normal. No rashes or lesions Neurologic: Grossly normal Incision/Wound: na  Assessment/Plan CTS left  He desires proceeding to have his carpal tunnel release. The pre, peri and post op course are discussed along with risks and complications.  He is aware there is no guarantee with surgery, possibility of infection, recurrence, injury to arteries, nerves and tendons, incomplete relief of symptoms and dystrophy.  He is scheduled for left carpal tunnel release as an outpatient. We would not recommend doing anything to his arthritis at the present time.  Jonathan Owen R 09/09/2012, 5:38 AM

## 2012-09-09 NOTE — Anesthesia Preprocedure Evaluation (Signed)
Anesthesia Evaluation  Patient identified by MRN, date of birth, ID band Patient awake    Reviewed: Allergy & Precautions, H&P , NPO status , Patient's Chart, lab work & pertinent test results  Airway Mallampati: II TM Distance: >3 FB Neck ROM: Full    Dental  (+) Teeth Intact and Dental Advisory Given   Pulmonary  breath sounds clear to auscultation        Cardiovascular hypertension, Pt. on medications Rhythm:Regular Rate:Normal     Neuro/Psych    GI/Hepatic   Endo/Other  diabetes, Well Controlled, Type 2  Renal/GU      Musculoskeletal   Abdominal   Peds  Hematology   Anesthesia Other Findings   Reproductive/Obstetrics                           Anesthesia Physical Anesthesia Plan  ASA: II  Anesthesia Plan: Bier Block and MAC   Post-op Pain Management:    Induction: Intravenous  Airway Management Planned: Simple Face Mask  Additional Equipment:   Intra-op Plan:   Post-operative Plan:   Informed Consent: I have reviewed the patients History and Physical, chart, labs and discussed the procedure including the risks, benefits and alternatives for the proposed anesthesia with the patient or authorized representative who has indicated his/her understanding and acceptance.   Dental advisory given  Plan Discussed with: CRNA, Anesthesiologist and Surgeon  Anesthesia Plan Comments:         Anesthesia Quick Evaluation

## 2012-09-09 NOTE — Brief Op Note (Signed)
09/09/2012  9:12 AM  PATIENT:  Garry Heater Gautreau  79 y.o. male  PRE-OPERATIVE DIAGNOSIS:  LEFT CARPAL TUNNEL SYNDROME  POST-OPERATIVE DIAGNOSIS:  left carpal tunnel syndrome  PROCEDURE:  Procedure(s): CARPAL TUNNEL RELEASE (Left)  SURGEON:  Surgeon(s) and Role:    * Nicki Reaper, MD - Primary  PHYSICIAN ASSISTANT:   ASSISTANTS: none   ANESTHESIA:   local and regional  EBL:  Total I/O In: 800 [I.V.:800] Out: -   BLOOD ADMINISTERED:none  DRAINS: none   LOCAL MEDICATIONS USED:  MARCAINE     SPECIMEN:  No Specimen  DISPOSITION OF SPECIMEN:  N/A  COUNTS:  YES  TOURNIQUET:   Total Tourniquet Time Documented: Forearm (Left) - 20 minutes Total: Forearm (Left) - 20 minutes   DICTATION: .Other Dictation: Dictation Number [id number]  PLAN OF CARE: Discharge to home after PACU  PATIENT DISPOSITION:  PACU - hemodynamically stable.

## 2012-09-10 NOTE — Op Note (Signed)
NAME:  Jonathan Owen, Jonathan Owen             ACCOUNT NO.:  [id number]  MEDICAL RECORD NO.:  [id number]  LOCATION:                                 FACILITY:  PHYSICIAN:  Cindee Salt, M.D.       DATE OF BIRTH:  14-Dec-1927  DATE OF PROCEDURE:  09/09/2012 DATE OF DISCHARGE:                              OPERATIVE REPORT   PREOPERATIVE DIAGNOSIS:  Carpal tunnel syndrome, left hand.  POSTOPERATIVE DIAGNOSIS:  Carpal tunnel syndrome, left hand.  OPERATION:  Decompression of left median nerve.  SURGEON:  Cindee Salt, M.D.  ANESTHESIA:  Forearm-based IV regional with local infiltration.  ANESTHESIOLOGIST:  Sheldon Silvan, M.D.  HISTORY:  The patient is an 79 year old male with a history of bilateral carpal tunnel syndrome.  Nerve conduction is positive.  He has undergone release on his right.  He is admitted now for release of left.  Pre, peri, and postoperative course have been discussed along with risks and complications.  He is aware that there is no guarantee with the surgery; possibility of infection; recurrence of injury to arteries, nerves, tendons; incomplete relief of symptoms, dystrophy.  In the preoperative area, the patient is seen, the extremity marked by both the patient and surgeon.  Antibiotic given.  PROCEDURE IN DETAIL:  The patient was brought to the operating room where a forearm-based IV regional anesthetic was carried out without difficulty.  He was prepped using ChloraPrep, supine position, left arm free.  A 3-minute dry time was allowed.  Time-out taken, confirming the patient and procedure.  A longitudinal incision was made in the left palm, carried down through the subcutaneous tissue.  Bleeders were electrocauterized.  Palmar fascia was split.  Superficial palmar arch identified.  Flexor tendon of the ring and little finger identified to the ulnar side of median nerve.  The carpal retinaculum was incised with sharp dissection.  Right angle and Sewell retractor were placed  between skin and forearm fascia.  The fascia released for approximately 1.5 cm proximal to the wrist crease under direct vision.  Canal was explored. Very prominent median artery was present.  This was not thrombosed. Area of compression was very apparent.  No further lesions were identified.  The wound was copiously irrigated with saline, and the skin closed with interrupted 4-0 Vicryl Rapide sutures.  Local infiltration with 0.25% Marcaine without epinephrine was given, 6 mL was used. Sterile compressive dressing was applied with the fingers free.  On deflation of the tourniquet, all fingers immediately pinked. He was taken to the recovery room for observation in satisfactory condition.  He will be discharged home to return in 1 week.  He has Vicodin from his prior procedure.          ______________________________ Cindee Salt, M.D.     GK/MEDQ  D:  09/09/2012  T:  09/10/2012  Job:  [id number]

## 2012-11-13 ENCOUNTER — Telehealth (INDEPENDENT_AMBULATORY_CARE_PROVIDER_SITE_OTHER): Payer: Self-pay | Admitting: General Surgery

## 2012-11-13 ENCOUNTER — Encounter (INDEPENDENT_AMBULATORY_CARE_PROVIDER_SITE_OTHER): Payer: Self-pay | Admitting: Surgery

## 2012-11-13 ENCOUNTER — Other Ambulatory Visit (INDEPENDENT_AMBULATORY_CARE_PROVIDER_SITE_OTHER): Payer: Self-pay | Admitting: Surgery

## 2012-11-13 ENCOUNTER — Ambulatory Visit (INDEPENDENT_AMBULATORY_CARE_PROVIDER_SITE_OTHER): Payer: Medicare Other | Admitting: Surgery

## 2012-11-13 VITALS — BP 162/84 | HR 100 | Temp 98.2°F | Resp 20 | Ht 70.0 in | Wt 205.8 lb

## 2012-11-13 DIAGNOSIS — N63 Unspecified lump in unspecified breast: Secondary | ICD-10-CM

## 2012-11-13 DIAGNOSIS — N611 Abscess of the breast and nipple: Secondary | ICD-10-CM

## 2012-11-13 HISTORY — DX: Unspecified lump in unspecified breast: N63.0

## 2012-11-13 NOTE — Telephone Encounter (Signed)
LMOM letting pt know that he has a MGM and [place] appt w/ [facility] on 11/14/12 at 8:30 w/ arrival time of 8:15.

## 2012-11-13 NOTE — Progress Notes (Signed)
Subjective:     Patient ID: Jonathan Owen, male   DOB: 1927/05/24, 79 y.o.   MRN: [id number]  HPI This is an 79 year old gentleman who is a self-referral.  He has had an area of tenderness and a small palpable lump just above the areola at the 12:00 position of the left chest area.  He was concerned about the area so we made the appointment. He reports he has sensitivity there daily. He has had no nipple discharge and no drainage.  Review of Systems     Objective:   Physical Exam On exam, there is only a slight fullness at the 12:00 position of the right breast adjacent to the areola. There is no adenopathy    Assessment:     Right breast mass     Plan:     I believe that the ultrasound this area to see if there is a cyst present or dilated ducts. I would then consider removing this area and the operating room for skidding a preoperative mammogram first. I will see him back after the ultrasound.

## 2012-11-13 NOTE — Telephone Encounter (Signed)
Talked to patient daughter and told her that her dad has a apt with the [facility] on 11-14-12 @ 8;30 for a MGM and [place] of his left breast and we will call him once we get the results. And I made him an apt with Dr Magnus Ivan on 11-25-12 @ 12;45

## 2012-11-14 ENCOUNTER — Ambulatory Visit
Admission: RE | Admit: 2012-11-14 | Discharge: 2012-11-14 | Disposition: A | Discharge status: Home / Self Care | Payer: Medicare Other | Source: Ambulatory Visit | Attending: Surgery | Admitting: Surgery

## 2012-11-14 DIAGNOSIS — N63 Unspecified lump in unspecified breast: Secondary | ICD-10-CM

## 2012-11-14 DIAGNOSIS — N611 Abscess of the breast and nipple: Secondary | ICD-10-CM

## 2012-11-25 ENCOUNTER — Ambulatory Visit (INDEPENDENT_AMBULATORY_CARE_PROVIDER_SITE_OTHER): Payer: Medicare Other | Admitting: Surgery

## 2012-11-25 ENCOUNTER — Encounter (INDEPENDENT_AMBULATORY_CARE_PROVIDER_SITE_OTHER): Payer: Self-pay | Admitting: Surgery

## 2012-11-25 VITALS — BP 125/80 | HR 70 | Temp 97.1°F | Resp 14 | Ht 71.0 in | Wt 206.6 lb

## 2012-11-25 DIAGNOSIS — N62 Hypertrophy of breast: Secondary | ICD-10-CM

## 2012-11-25 NOTE — Progress Notes (Signed)
Subjective:     Patient ID: Jonathan Owen, male   DOB: Jul 19, 1927, 79 y.o.   MRN: [id number]  HPI He is here for a followup visit. He still has some discomfort at the left areola.  Review of Systems     Objective:   Physical Exam On exam, there is mild fullness compared to the right but no discrete mass  The mammograms confirmed bilateral gynecomastia    Assessment:     Gynecomastia     Plan:     I explained this to him in detail. He is relieved. He does not desire any surgery. I reassured him. I will see him back as needed

## 2014-05-11 IMAGING — CR DG CHEST 2V
2 series · 2 of 2 positions shown · non-contrast
Comparison: 03/19/2007.

CLINICAL DATA: Chest pain and epigastric pain.

CHEST - 2 VIEW

[w chest pa]
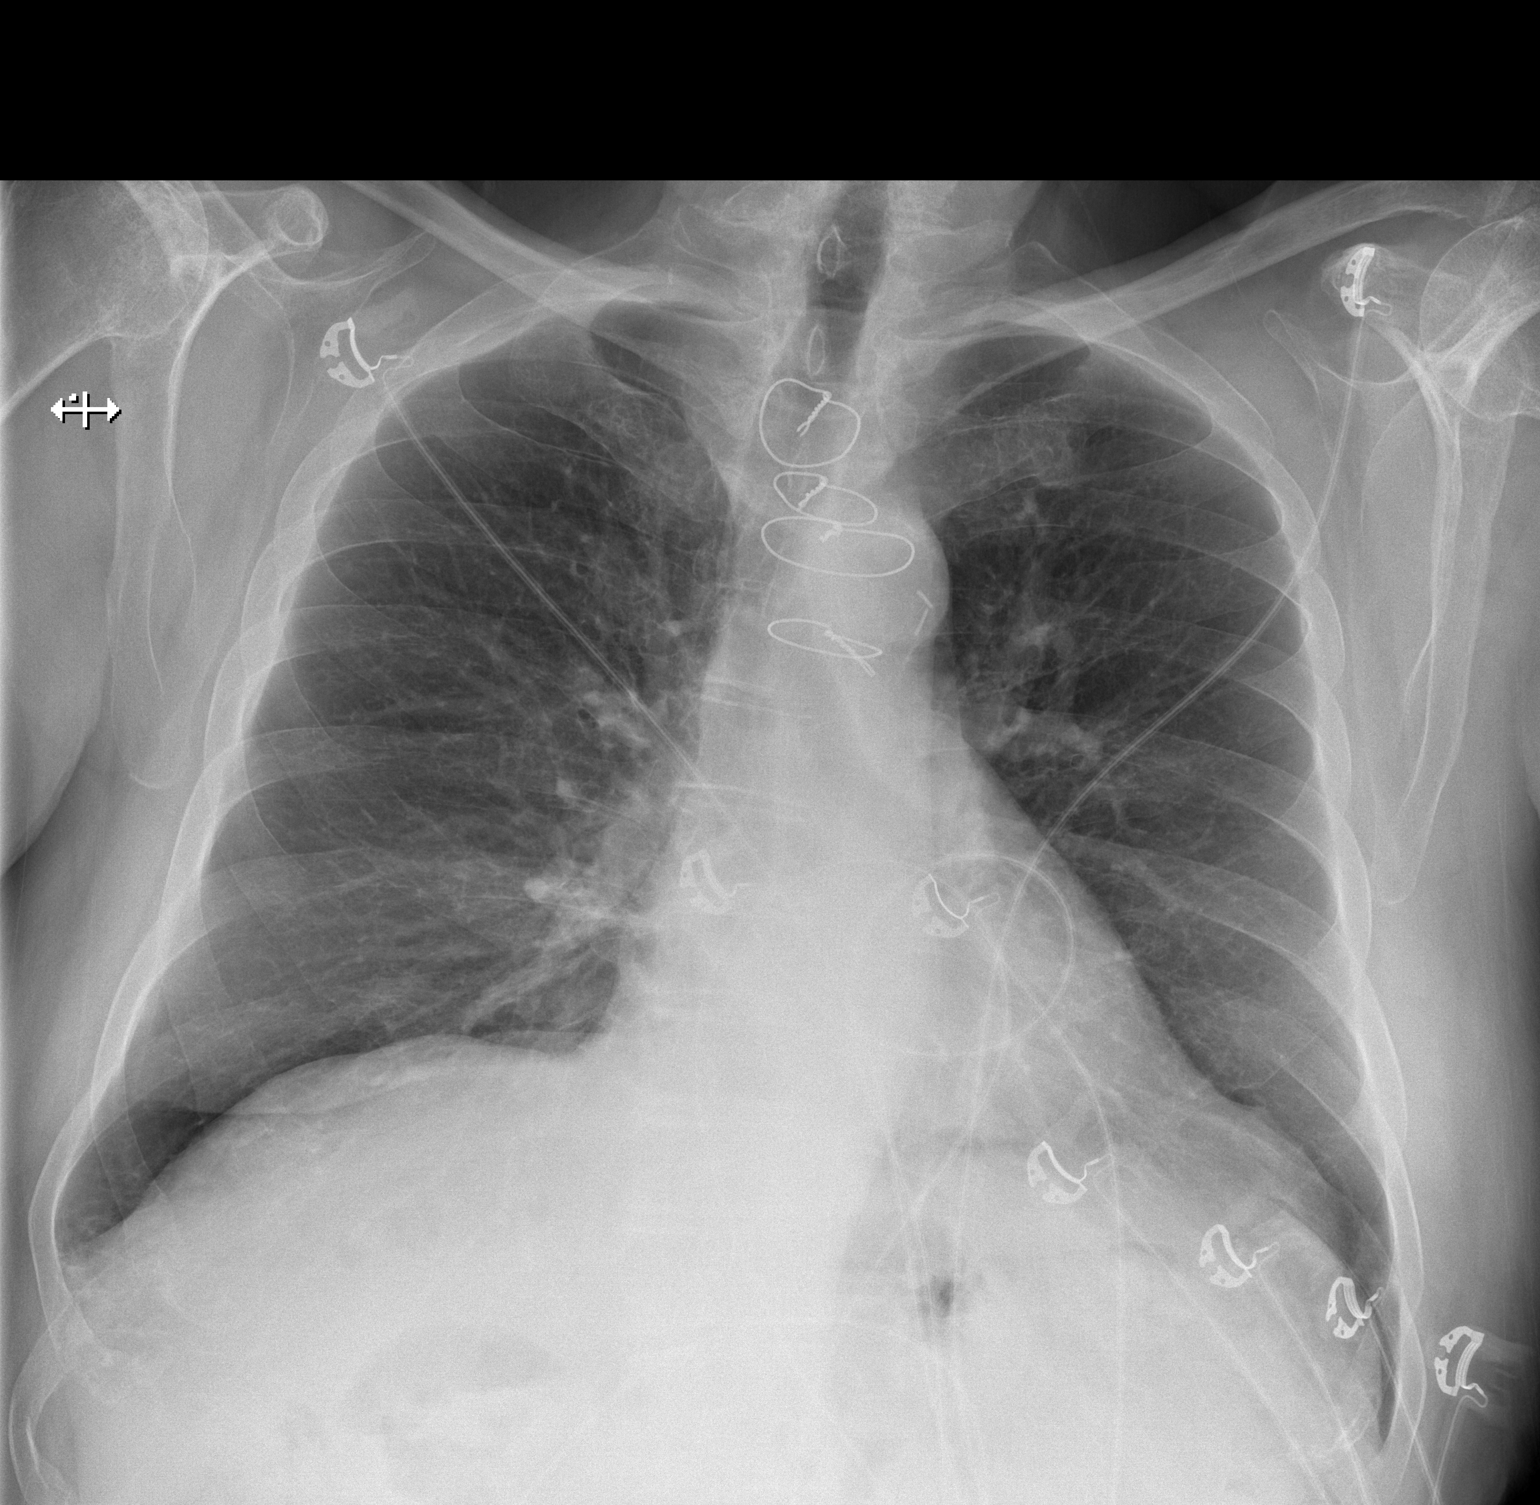

[w chest lat]
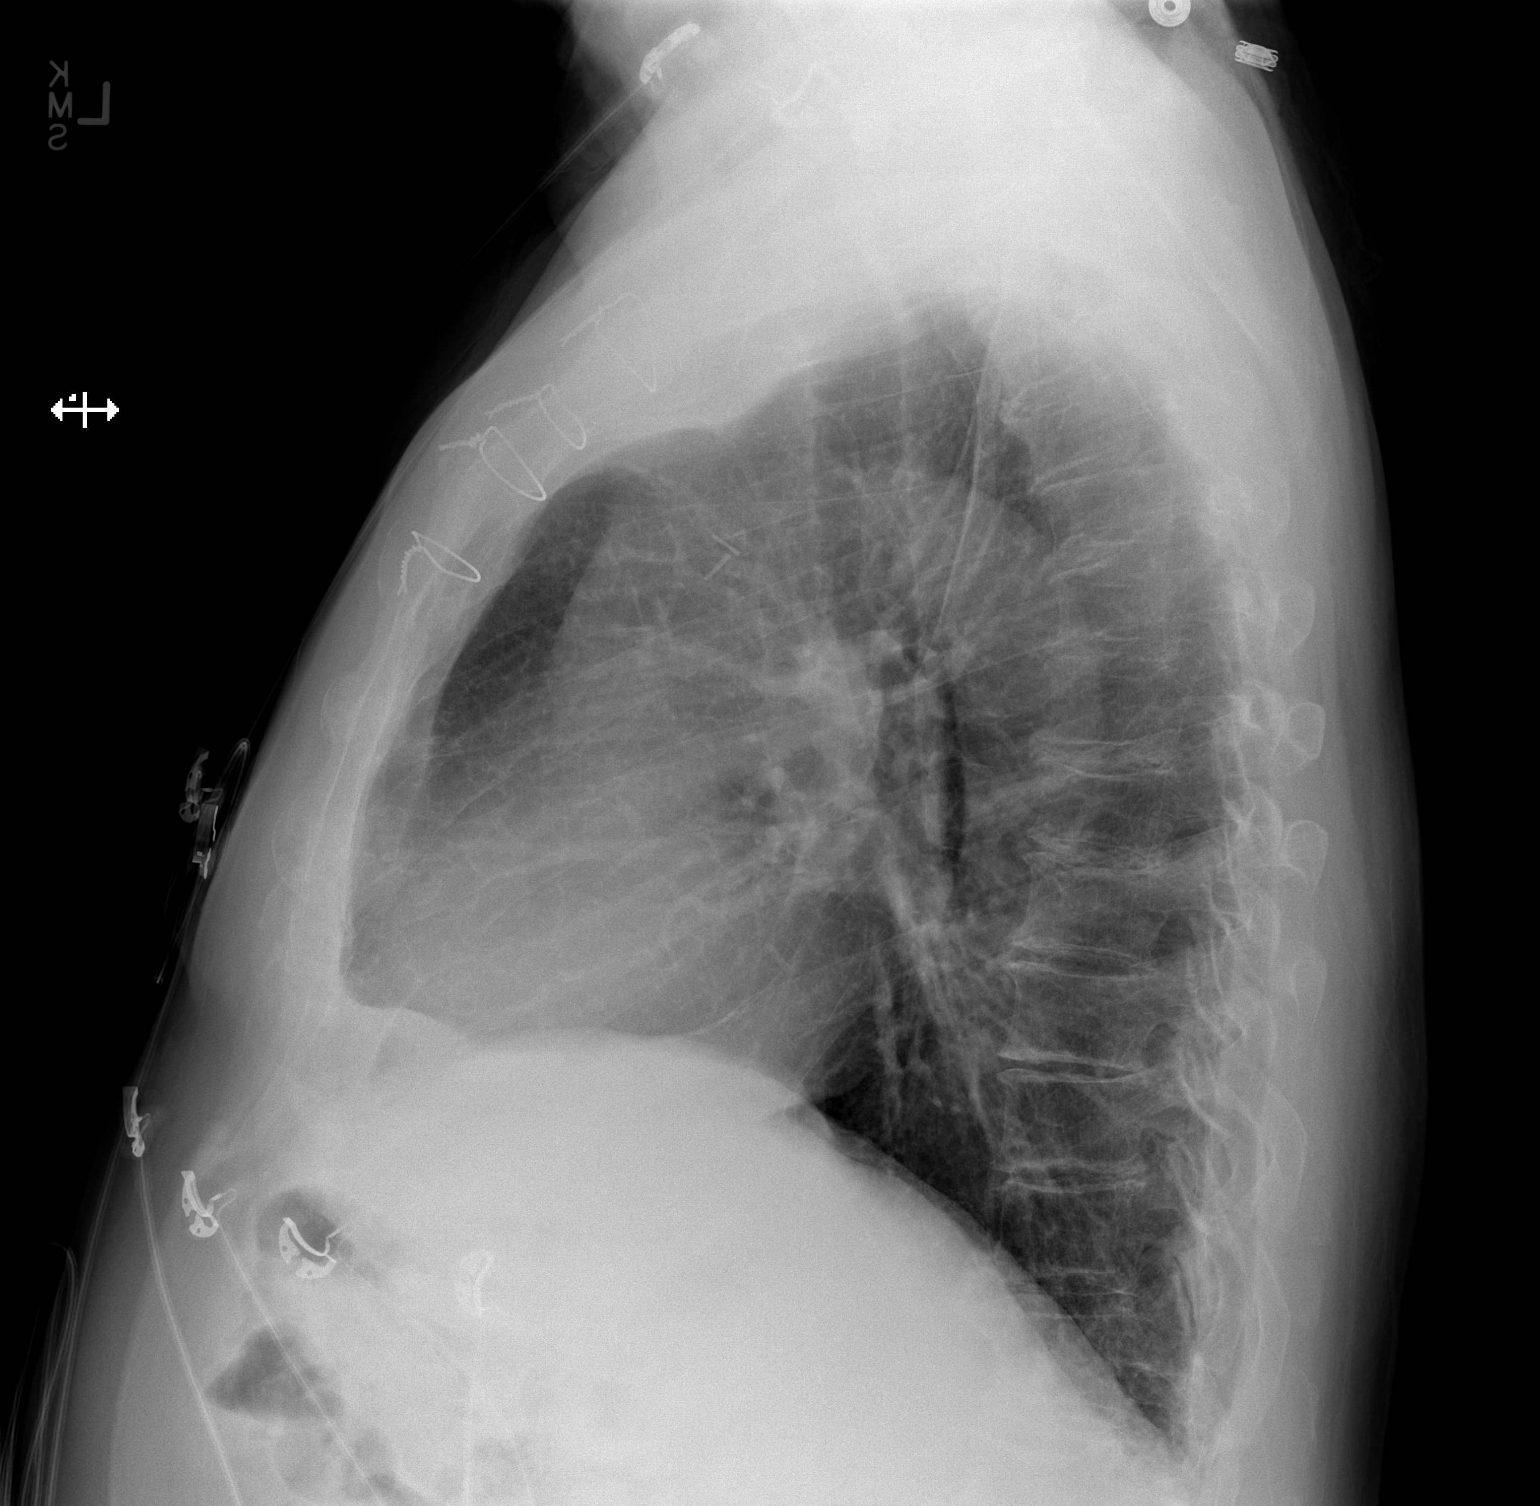

[2 of 2 positions shown; findings below may reference images not displayed]

FINDINGS: The cardiac silhouette, mediastinal and hilar contours
are within normal limits and stable.  The lungs are clear.
Prominent epicardial fat noted on the left.  No pleural effusion.
The bony thorax is intact.
IMPRESSION: No acute cardiopulmonary findings.

## 2015-08-01 DIAGNOSIS — E119 Type 2 diabetes mellitus without complications: Secondary | ICD-10-CM | POA: Insufficient documentation

## 2017-08-05 DIAGNOSIS — J209 Acute bronchitis, unspecified: Secondary | ICD-10-CM | POA: Diagnosis not present

## 2017-08-05 DIAGNOSIS — H6123 Impacted cerumen, bilateral: Secondary | ICD-10-CM | POA: Diagnosis not present

## 2017-09-03 DIAGNOSIS — M2550 Pain in unspecified joint: Secondary | ICD-10-CM | POA: Diagnosis not present

## 2017-09-03 DIAGNOSIS — M25512 Pain in left shoulder: Secondary | ICD-10-CM | POA: Diagnosis not present

## 2017-09-03 DIAGNOSIS — M353 Polymyalgia rheumatica: Secondary | ICD-10-CM | POA: Diagnosis not present

## 2017-09-03 DIAGNOSIS — R768 Other specified abnormal immunological findings in serum: Secondary | ICD-10-CM | POA: Diagnosis not present

## 2017-09-03 DIAGNOSIS — Z6832 Body mass index (BMI) 32.0-32.9, adult: Secondary | ICD-10-CM | POA: Diagnosis not present

## 2017-09-03 DIAGNOSIS — M791 Myalgia, unspecified site: Secondary | ICD-10-CM | POA: Diagnosis not present

## 2017-09-03 DIAGNOSIS — E669 Obesity, unspecified: Secondary | ICD-10-CM | POA: Diagnosis not present

## 2017-10-06 DIAGNOSIS — I10 Essential (primary) hypertension: Secondary | ICD-10-CM | POA: Diagnosis not present

## 2017-10-06 DIAGNOSIS — R457 State of emotional shock and stress, unspecified: Secondary | ICD-10-CM | POA: Diagnosis not present

## 2017-10-06 DIAGNOSIS — R52 Pain, unspecified: Secondary | ICD-10-CM | POA: Diagnosis not present

## 2017-10-28 DIAGNOSIS — E6609 Other obesity due to excess calories: Secondary | ICD-10-CM | POA: Diagnosis not present

## 2017-10-28 DIAGNOSIS — Z794 Long term (current) use of insulin: Secondary | ICD-10-CM | POA: Diagnosis not present

## 2017-10-28 DIAGNOSIS — E119 Type 2 diabetes mellitus without complications: Secondary | ICD-10-CM | POA: Diagnosis not present

## 2017-10-28 DIAGNOSIS — Z6834 Body mass index (BMI) 34.0-34.9, adult: Secondary | ICD-10-CM | POA: Diagnosis not present

## 2017-10-28 DIAGNOSIS — E785 Hyperlipidemia, unspecified: Secondary | ICD-10-CM | POA: Diagnosis not present

## 2017-12-04 DIAGNOSIS — R531 Weakness: Secondary | ICD-10-CM | POA: Diagnosis not present

## 2017-12-04 DIAGNOSIS — M353 Polymyalgia rheumatica: Secondary | ICD-10-CM | POA: Diagnosis not present

## 2017-12-18 DIAGNOSIS — M353 Polymyalgia rheumatica: Secondary | ICD-10-CM | POA: Diagnosis not present

## 2018-01-13 DIAGNOSIS — Z23 Encounter for immunization: Secondary | ICD-10-CM | POA: Diagnosis not present

## 2018-01-13 DIAGNOSIS — Z6833 Body mass index (BMI) 33.0-33.9, adult: Secondary | ICD-10-CM | POA: Diagnosis not present

## 2018-01-13 DIAGNOSIS — F5101 Primary insomnia: Secondary | ICD-10-CM | POA: Diagnosis not present

## 2018-01-13 DIAGNOSIS — H6123 Impacted cerumen, bilateral: Secondary | ICD-10-CM | POA: Diagnosis not present

## 2018-03-10 DIAGNOSIS — E119 Type 2 diabetes mellitus without complications: Secondary | ICD-10-CM | POA: Diagnosis not present

## 2018-03-10 DIAGNOSIS — R000 Tachycardia, unspecified: Secondary | ICD-10-CM | POA: Diagnosis not present

## 2018-03-10 DIAGNOSIS — J984 Other disorders of lung: Secondary | ICD-10-CM | POA: Diagnosis not present

## 2018-03-10 DIAGNOSIS — Z85038 Personal history of other malignant neoplasm of large intestine: Secondary | ICD-10-CM | POA: Diagnosis not present

## 2018-03-10 DIAGNOSIS — Z794 Long term (current) use of insulin: Secondary | ICD-10-CM | POA: Diagnosis not present

## 2018-03-10 DIAGNOSIS — R079 Chest pain, unspecified: Secondary | ICD-10-CM | POA: Diagnosis not present

## 2018-03-10 DIAGNOSIS — R531 Weakness: Secondary | ICD-10-CM | POA: Diagnosis not present

## 2018-03-14 DIAGNOSIS — Z91048 Other nonmedicinal substance allergy status: Secondary | ICD-10-CM | POA: Diagnosis not present

## 2018-03-14 DIAGNOSIS — S01111A Laceration without foreign body of right eyelid and periocular area, initial encounter: Secondary | ICD-10-CM | POA: Diagnosis not present

## 2018-03-14 DIAGNOSIS — S0990XA Unspecified injury of head, initial encounter: Secondary | ICD-10-CM | POA: Diagnosis not present

## 2018-03-14 DIAGNOSIS — E119 Type 2 diabetes mellitus without complications: Secondary | ICD-10-CM | POA: Diagnosis not present

## 2018-03-14 DIAGNOSIS — Z794 Long term (current) use of insulin: Secondary | ICD-10-CM | POA: Diagnosis not present

## 2018-03-14 DIAGNOSIS — G319 Degenerative disease of nervous system, unspecified: Secondary | ICD-10-CM | POA: Diagnosis not present

## 2018-03-14 DIAGNOSIS — R220 Localized swelling, mass and lump, head: Secondary | ICD-10-CM | POA: Diagnosis not present

## 2018-03-14 DIAGNOSIS — W19XXXA Unspecified fall, initial encounter: Secondary | ICD-10-CM | POA: Diagnosis not present

## 2018-03-14 DIAGNOSIS — S0093XA Contusion of unspecified part of head, initial encounter: Secondary | ICD-10-CM | POA: Diagnosis not present

## 2018-03-14 DIAGNOSIS — Z87891 Personal history of nicotine dependence: Secondary | ICD-10-CM | POA: Diagnosis not present

## 2018-03-14 DIAGNOSIS — R51 Headache: Secondary | ICD-10-CM | POA: Diagnosis not present

## 2018-03-25 DIAGNOSIS — E1129 Type 2 diabetes mellitus with other diabetic kidney complication: Secondary | ICD-10-CM | POA: Diagnosis not present

## 2018-03-25 DIAGNOSIS — M353 Polymyalgia rheumatica: Secondary | ICD-10-CM | POA: Diagnosis not present

## 2018-03-25 DIAGNOSIS — R531 Weakness: Secondary | ICD-10-CM | POA: Diagnosis not present

## 2018-03-25 DIAGNOSIS — Z4802 Encounter for removal of sutures: Secondary | ICD-10-CM | POA: Diagnosis not present

## 2018-03-25 DIAGNOSIS — G4700 Insomnia, unspecified: Secondary | ICD-10-CM | POA: Diagnosis not present

## 2018-03-25 DIAGNOSIS — F1320 Sedative, hypnotic or anxiolytic dependence, uncomplicated: Secondary | ICD-10-CM | POA: Diagnosis not present

## 2018-03-26 DIAGNOSIS — E118 Type 2 diabetes mellitus with unspecified complications: Secondary | ICD-10-CM | POA: Diagnosis not present

## 2018-03-26 DIAGNOSIS — Z79899 Other long term (current) drug therapy: Secondary | ICD-10-CM | POA: Diagnosis not present

## 2018-03-26 DIAGNOSIS — M353 Polymyalgia rheumatica: Secondary | ICD-10-CM | POA: Diagnosis not present

## 2018-03-26 DIAGNOSIS — E11622 Type 2 diabetes mellitus with other skin ulcer: Secondary | ICD-10-CM | POA: Diagnosis not present

## 2018-03-26 DIAGNOSIS — R531 Weakness: Secondary | ICD-10-CM | POA: Diagnosis not present

## 2018-04-01 DIAGNOSIS — E1139 Type 2 diabetes mellitus with other diabetic ophthalmic complication: Secondary | ICD-10-CM | POA: Diagnosis not present

## 2018-04-01 DIAGNOSIS — H52223 Regular astigmatism, bilateral: Secondary | ICD-10-CM | POA: Diagnosis not present

## 2018-04-01 DIAGNOSIS — E118 Type 2 diabetes mellitus with unspecified complications: Secondary | ICD-10-CM | POA: Diagnosis not present

## 2018-04-01 DIAGNOSIS — H524 Presbyopia: Secondary | ICD-10-CM | POA: Diagnosis not present

## 2018-04-01 DIAGNOSIS — Z961 Presence of intraocular lens: Secondary | ICD-10-CM | POA: Diagnosis not present

## 2018-04-07 DIAGNOSIS — L97829 Non-pressure chronic ulcer of other part of left lower leg with unspecified severity: Secondary | ICD-10-CM | POA: Diagnosis not present

## 2018-04-07 DIAGNOSIS — R531 Weakness: Secondary | ICD-10-CM | POA: Diagnosis not present

## 2018-04-07 DIAGNOSIS — W19XXXA Unspecified fall, initial encounter: Secondary | ICD-10-CM | POA: Diagnosis not present

## 2018-04-07 DIAGNOSIS — H9190 Unspecified hearing loss, unspecified ear: Secondary | ICD-10-CM | POA: Diagnosis not present

## 2018-04-07 DIAGNOSIS — E11622 Type 2 diabetes mellitus with other skin ulcer: Secondary | ICD-10-CM | POA: Diagnosis not present

## 2018-04-10 DIAGNOSIS — M353 Polymyalgia rheumatica: Secondary | ICD-10-CM | POA: Diagnosis not present

## 2018-04-10 DIAGNOSIS — E11622 Type 2 diabetes mellitus with other skin ulcer: Secondary | ICD-10-CM | POA: Diagnosis not present

## 2018-07-15 DIAGNOSIS — I10 Essential (primary) hypertension: Secondary | ICD-10-CM

## 2018-07-15 HISTORY — DX: Essential (primary) hypertension: I10

## 2018-07-21 DIAGNOSIS — Z85038 Personal history of other malignant neoplasm of large intestine: Secondary | ICD-10-CM | POA: Insufficient documentation

## 2018-07-21 DIAGNOSIS — S72142D Displaced intertrochanteric fracture of left femur, subsequent encounter for closed fracture with routine healing: Secondary | ICD-10-CM

## 2018-07-21 HISTORY — DX: Personal history of other malignant neoplasm of large intestine: Z85.038

## 2018-07-21 HISTORY — DX: Displaced intertrochanteric fracture of left femur, subsequent encounter for closed fracture with routine healing: S72.142D

## 2018-10-24 DIAGNOSIS — S72142D Displaced intertrochanteric fracture of left femur, subsequent encounter for closed fracture with routine healing: Secondary | ICD-10-CM | POA: Diagnosis not present

## 2018-10-24 DIAGNOSIS — W19XXXD Unspecified fall, subsequent encounter: Secondary | ICD-10-CM | POA: Diagnosis not present

## 2018-10-27 DIAGNOSIS — I82412 Acute embolism and thrombosis of left femoral vein: Secondary | ICD-10-CM | POA: Diagnosis not present

## 2018-10-27 DIAGNOSIS — E1140 Type 2 diabetes mellitus with diabetic neuropathy, unspecified: Secondary | ICD-10-CM | POA: Diagnosis not present

## 2018-10-27 DIAGNOSIS — I771 Stricture of artery: Secondary | ICD-10-CM | POA: Diagnosis not present

## 2018-10-27 DIAGNOSIS — S72009D Fracture of unspecified part of neck of unspecified femur, subsequent encounter for closed fracture with routine healing: Secondary | ICD-10-CM | POA: Diagnosis not present

## 2018-10-27 DIAGNOSIS — R000 Tachycardia, unspecified: Secondary | ICD-10-CM | POA: Diagnosis not present

## 2018-10-27 DIAGNOSIS — I2699 Other pulmonary embolism without acute cor pulmonale: Secondary | ICD-10-CM | POA: Diagnosis not present

## 2018-10-29 DIAGNOSIS — Z6830 Body mass index (BMI) 30.0-30.9, adult: Secondary | ICD-10-CM | POA: Diagnosis not present

## 2018-10-29 DIAGNOSIS — E785 Hyperlipidemia, unspecified: Secondary | ICD-10-CM | POA: Diagnosis not present

## 2018-10-29 DIAGNOSIS — E119 Type 2 diabetes mellitus without complications: Secondary | ICD-10-CM | POA: Diagnosis not present

## 2018-10-29 DIAGNOSIS — E669 Obesity, unspecified: Secondary | ICD-10-CM | POA: Diagnosis not present

## 2018-10-29 DIAGNOSIS — Z794 Long term (current) use of insulin: Secondary | ICD-10-CM | POA: Diagnosis not present

## 2018-11-01 DIAGNOSIS — M2550 Pain in unspecified joint: Secondary | ICD-10-CM | POA: Diagnosis not present

## 2018-11-01 DIAGNOSIS — Z86711 Personal history of pulmonary embolism: Secondary | ICD-10-CM | POA: Diagnosis not present

## 2018-11-01 DIAGNOSIS — G4700 Insomnia, unspecified: Secondary | ICD-10-CM | POA: Diagnosis not present

## 2018-11-01 DIAGNOSIS — K5900 Constipation, unspecified: Secondary | ICD-10-CM | POA: Diagnosis not present

## 2018-11-01 DIAGNOSIS — Z947 Corneal transplant status: Secondary | ICD-10-CM | POA: Diagnosis not present

## 2018-11-01 DIAGNOSIS — E785 Hyperlipidemia, unspecified: Secondary | ICD-10-CM | POA: Diagnosis not present

## 2018-11-01 DIAGNOSIS — H9190 Unspecified hearing loss, unspecified ear: Secondary | ICD-10-CM | POA: Diagnosis not present

## 2018-11-01 DIAGNOSIS — R42 Dizziness and giddiness: Secondary | ICD-10-CM | POA: Diagnosis not present

## 2018-11-01 DIAGNOSIS — Z9049 Acquired absence of other specified parts of digestive tract: Secondary | ICD-10-CM | POA: Diagnosis not present

## 2018-11-01 DIAGNOSIS — E119 Type 2 diabetes mellitus without complications: Secondary | ICD-10-CM | POA: Diagnosis not present

## 2018-11-01 DIAGNOSIS — Z7901 Long term (current) use of anticoagulants: Secondary | ICD-10-CM | POA: Diagnosis not present

## 2018-11-01 DIAGNOSIS — Z6828 Body mass index (BMI) 28.0-28.9, adult: Secondary | ICD-10-CM | POA: Diagnosis not present

## 2018-12-26 DIAGNOSIS — M353 Polymyalgia rheumatica: Secondary | ICD-10-CM | POA: Diagnosis not present

## 2018-12-26 DIAGNOSIS — R531 Weakness: Secondary | ICD-10-CM | POA: Diagnosis not present

## 2019-01-09 DIAGNOSIS — E11622 Type 2 diabetes mellitus with other skin ulcer: Secondary | ICD-10-CM | POA: Diagnosis not present

## 2019-01-27 DIAGNOSIS — I2699 Other pulmonary embolism without acute cor pulmonale: Secondary | ICD-10-CM | POA: Diagnosis not present

## 2019-01-27 DIAGNOSIS — I10 Essential (primary) hypertension: Secondary | ICD-10-CM | POA: Diagnosis not present

## 2019-02-23 ENCOUNTER — Other Ambulatory Visit: Payer: Self-pay

## 2019-02-23 DIAGNOSIS — Z20822 Contact with and (suspected) exposure to covid-19: Secondary | ICD-10-CM

## 2019-02-24 LAB — NOVEL CORONAVIRUS, NAA: SARS-CoV-2, NAA: NOT DETECTED

## 2019-05-11 ENCOUNTER — Ambulatory Visit: Payer: Self-pay | Attending: Internal Medicine

## 2019-05-11 DIAGNOSIS — Z23 Encounter for immunization: Secondary | ICD-10-CM | POA: Insufficient documentation

## 2019-05-11 NOTE — Progress Notes (Signed)
   Covid-19 Vaccination Clinic  Name:  Jonathan Owen    MRN: [id number] DOB: [birth year]  05/11/2019  [name] was observed post Covid-19 immunization for 15 minutes without incidence. He was provided with Vaccine Information Sheet and instruction to access the V-Safe system.   [name] was instructed to call 911 with any severe reactions post vaccine: Marland Kitchen Difficulty breathing  . Swelling of your face and throat  . A fast heartbeat  . A bad rash all over your body  . Dizziness and weakness    Immunizations Administered    Name Date Dose VIS Date Route   Pfizer COVID-19 Vaccine 05/11/2019  9:14 AM 0.3 mL 03/27/2019 Intramuscular   Manufacturer: [place]   Lot: [id number]   [place]: [id number]

## 2019-06-01 ENCOUNTER — Ambulatory Visit: Payer: Medicare HMO | Attending: Internal Medicine

## 2019-06-01 DIAGNOSIS — Z23 Encounter for immunization: Secondary | ICD-10-CM | POA: Insufficient documentation

## 2019-06-01 NOTE — Progress Notes (Signed)
   Covid-19 Vaccination Clinic  Name:  Jonathan Owen    MRN: [id number] DOB: December 26, 1927  06/01/2019  [name] was observed post Covid-19 immunization for 15 minutes without incidence. He was provided with Vaccine Information Sheet and instruction to access the V-Safe system.   [name] was instructed to call 911 with any severe reactions post vaccine: Marland Kitchen Difficulty breathing  . Swelling of your face and throat  . A fast heartbeat  . A bad rash all over your body  . Dizziness and weakness    Immunizations Administered    Name Date Dose VIS Date Route   Pfizer COVID-19 Vaccine 06/01/2019  9:46 AM 0.3 mL 03/27/2019 Intramuscular   Manufacturer: [place]   Lot: [id number]   [place]: [id number]

## 2020-03-22 DIAGNOSIS — M545 Low back pain, unspecified: Secondary | ICD-10-CM

## 2020-03-22 HISTORY — DX: Low back pain, unspecified: M54.50

## 2020-07-23 ENCOUNTER — Other Ambulatory Visit: Payer: Self-pay | Admitting: Physical Medicine and Rehabilitation

## 2020-07-23 DIAGNOSIS — M4850XA Collapsed vertebra, not elsewhere classified, site unspecified, initial encounter for fracture: Secondary | ICD-10-CM

## 2020-09-19 DIAGNOSIS — M810 Age-related osteoporosis without current pathological fracture: Secondary | ICD-10-CM

## 2020-09-19 HISTORY — DX: Age-related osteoporosis without current pathological fracture: M81.0

## 2021-02-04 ENCOUNTER — Emergency Department (HOSPITAL_COMMUNITY): Payer: Medicare HMO

## 2021-02-04 ENCOUNTER — Emergency Department (HOSPITAL_COMMUNITY)
Admission: EM | Admit: 2021-02-04 | Discharge: 2021-02-04 | Disposition: A | Discharge status: Home / Self Care | Payer: Medicare HMO | Attending: Emergency Medicine | Admitting: Emergency Medicine

## 2021-02-04 ENCOUNTER — Other Ambulatory Visit: Payer: Self-pay

## 2021-02-04 ENCOUNTER — Encounter (HOSPITAL_COMMUNITY): Payer: Self-pay | Admitting: Emergency Medicine

## 2021-02-04 DIAGNOSIS — R030 Elevated blood-pressure reading, without diagnosis of hypertension: Secondary | ICD-10-CM

## 2021-02-04 DIAGNOSIS — R109 Unspecified abdominal pain: Secondary | ICD-10-CM | POA: Diagnosis not present

## 2021-02-04 DIAGNOSIS — R0789 Other chest pain: Secondary | ICD-10-CM | POA: Insufficient documentation

## 2021-02-04 DIAGNOSIS — Z20822 Contact with and (suspected) exposure to covid-19: Secondary | ICD-10-CM | POA: Insufficient documentation

## 2021-02-04 DIAGNOSIS — I10 Essential (primary) hypertension: Secondary | ICD-10-CM | POA: Insufficient documentation

## 2021-02-04 DIAGNOSIS — E119 Type 2 diabetes mellitus without complications: Secondary | ICD-10-CM | POA: Diagnosis not present

## 2021-02-04 LAB — URINALYSIS, ROUTINE W REFLEX MICROSCOPIC
Bilirubin Urine: NEGATIVE
Glucose, UA: 50 mg/dL — AB
Hgb urine dipstick: NEGATIVE
Ketones, ur: NEGATIVE mg/dL
Leukocytes,Ua: NEGATIVE
Nitrite: NEGATIVE
Protein, ur: NEGATIVE mg/dL
Specific Gravity, Urine: 1.013 (ref 1.005–1.030)
pH: 6 (ref 5.0–8.0)

## 2021-02-04 LAB — TROPONIN I (HIGH SENSITIVITY)
Troponin I (High Sensitivity): 3 ng/L (ref <18)
Troponin I (High Sensitivity): 3 ng/L (ref <18)

## 2021-02-04 LAB — BASIC METABOLIC PANEL
Anion gap: 9 (ref 5–15)
BUN: 13 mg/dL (ref 8–23)
CO2: 22 mmol/L (ref 22–32)
Calcium: 9 mg/dL (ref 8.9–10.3)
Chloride: 100 mmol/L (ref 98–111)
Creatinine, Ser: 0.58 mg/dL — ABNORMAL LOW (ref 0.61–1.24)
GFR, Estimated: >60 mL/min (ref >60)
Glucose, Bld: 170 mg/dL — ABNORMAL HIGH (ref 70–99)
Potassium: 3.8 mmol/L (ref 3.5–5.1)
Sodium: 131 mmol/L — ABNORMAL LOW (ref 135–145)

## 2021-02-04 LAB — CBC
HCT: 44.8 % (ref 39.0–52.0)
Hemoglobin: 15.2 g/dL (ref 13.0–17.0)
MCH: 30.9 pg (ref 26.0–34.0)
MCHC: 33.9 g/dL (ref 30.0–36.0)
MCV: 91.1 fL (ref 80.0–100.0)
Platelets: 268 10*3/uL (ref 150–400)
RBC: 4.92 MIL/uL (ref 4.22–5.81)
RDW: 13 % (ref 11.5–15.5)
WBC: 7.4 10*3/uL (ref 4.0–10.5)
nRBC: 0 % (ref 0.0–0.2)

## 2021-02-04 LAB — RESP PANEL BY RT-PCR (FLU A&B, COVID) ARPGX2
Influenza A by PCR: NEGATIVE
Influenza B by PCR: NEGATIVE
SARS Coronavirus 2 by RT PCR: NEGATIVE

## 2021-02-04 LAB — CBG MONITORING, ED: Glucose-Capillary: 174 mg/dL — ABNORMAL HIGH (ref 70–99)

## 2021-02-04 MED ORDER — IOHEXOL 350 MG/ML SOLN
80.0000 mL | Freq: Once | INTRAVENOUS | Status: AC | PRN
  Administered 2021-02-04: 80 mL via INTRAVENOUS

## 2021-02-04 MED ORDER — FAMOTIDINE IN NACL 20-0.9 MG/50ML-% IV SOLN
20.0000 mg | Freq: Once | INTRAVENOUS | Status: AC
  Administered 2021-02-04: 20 mg via INTRAVENOUS
  Filled 2021-02-04: qty 50

## 2021-02-04 NOTE — ED Notes (Signed)
Wayne Both, niece, can be contacted at [phone].

## 2021-02-04 NOTE — ED Notes (Signed)
Patient transported to CT 

## 2021-02-04 NOTE — ED Provider Notes (Signed)
Emergency Medicine Provider Triage Evaluation Note  Jonathan Owen , a 87 y.o. male  was evaluated in triage.  Pt complains of weakness x3 days.  Patient states that he has been overall feeling fatigued for several weeks.  Notes that when the therapist came to his house on Thursday he was having body aches and his blood pressure was fluctuating.  Today he was checking his blood pressure after getting up and walking around his house, and his systolic dropped into the 90s.  Overall symptoms came on gradually, there is no acute change.  Also endorsing chest tightness.  Review of Systems  Positive: Weakness, lightheadedness, chest tightness Negative: Fevers, chills, shortness of breath, abdominal pain  Physical Exam  BP (!) 156/88 (BP Location: Left Arm)   Pulse 95   Temp 98.3 F (36.8 C) (Oral)   Resp 20   SpO2 99%  Gen:   Awake, no distress   Resp:  Normal effort  MSK:   Moves extremities without difficulty  Other:  Neuro exam grossly normal  Medical Decision Making  Medically screening exam initiated at 4:17 PM.  Appropriate orders placed.  Jonathan Owen was informed that the remainder of the evaluation will be completed by another provider, this initial triage assessment does not replace that evaluation, and the importance of remaining in the ED until their evaluation is complete.     Kateri Plummer, PA-C 02/04/21 1627    Jeanell Sparrow, DO 02/05/21 1415

## 2021-02-04 NOTE — ED Triage Notes (Signed)
BIB EMS from home, complains of weakness, elevated BP x3 days, reports epigastric pain and heartburn. Pt was orthostatic w/ EMS, initial BP 161/82, HR 100 sitting to standing at 143/82, HR 115. EKG showed tachy w/ EMS.

## 2021-02-04 NOTE — ED Provider Notes (Signed)
[facility] DEPT Provider Note   CSN: [id number] Arrival date & time: 02/04/21  1538     History Chief Complaint  Patient presents with   Weakness   Hypertension   Abdominal Pain    Jonathan Owen is a 88 y.o. male.  This is a 87 y.o. male with significant medical history as below, including DM, HTN who presents to the ED with complaint of fatigue, concern over blood sugar, chest pain.   Location:  sub sternal Duration:  2 days Onset:  gradual Timing:  intermittent Description:  sharp, pinpoint pain Severity:  mild Exacerbating/Alleviating Factors:  worse with palpation, worse with eating  Associated Symptoms:  mid-epigastric discomfort  Pertinent Negatives:  no fevers, chills, N/V, no dyspnea, no rashes, no change to bowel/bladder function. No palpitations or lightheadedness   Context: patient is concern that his blood glucose has been more variable than normal, he has good compliance with home medications. Glucose will range from around 100 to 200. No polyuria or polydipsia.    The history is provided by the patient and a relative. No language interpreter was used.  Hypertension Associated symptoms include chest pain. Pertinent negatives include no abdominal pain, no headaches and no shortness of breath.  Abdominal Pain Associated symptoms: chest pain and fatigue   Associated symptoms: no chills, no cough, no fever, no hematuria, no nausea, no shortness of breath and no vomiting       Past Medical History:  Diagnosis Date   Arthritis    Cancer ([place])    hx colon cancer   Diabetes mellitus without complication ([place])    no meds-watches diet   Hypertension    no meds now-   Wears dentures    upper-partial bottom   Wears glasses     Patient Active Problem List   Diagnosis Date Noted   Breast mass in male 11/13/2012    Past Surgical History:  Procedure Laterality Date   CARPAL TUNNEL RELEASE  03/04/2012   Procedure: CARPAL  TUNNEL RELEASE;  Surgeon: Wynonia Sours, MD;  Location: [facility];  Service: Orthopedics;  Laterality: Right;   CARPAL TUNNEL RELEASE Left 09/09/2012   Procedure: CARPAL TUNNEL RELEASE;  Surgeon: Wynonia Sours, MD;  Location: [facility];  Service: Orthopedics;  Laterality: Left;   COLONOSCOPY     COLONOSCOPY W/ BIOPSIES AND POLYPECTOMY     PARTIAL COLECTOMY  2006   sigmoid   STERNOTOMY  2006   medial-thymectomy   UPPER GASTROINTESTINAL ENDOSCOPY  2006       No family history on file.  Social History   Tobacco Use   Smoking status: Never   Smokeless tobacco: Never  Substance Use Topics   Alcohol use: No   Drug use: No    Home Medications Prior to Admission medications   Medication Sig Start Date End Date Taking? Authorizing Provider  aspirin 81 MG tablet Take 81 mg by mouth daily.    [provider]  LORazepam (ATIVAN) 1 MG tablet Take 0.5 mg by mouth at bedtime as needed.     [provider]    Allergies    Patient has no known allergies.  Review of Systems   Review of Systems  Constitutional:  Positive for fatigue. Negative for chills and fever.  HENT:  Negative for facial swelling and trouble swallowing.   Eyes:  Negative for photophobia and visual disturbance.  Respiratory:  Negative for cough and shortness of breath.  Cardiovascular:  Positive for chest pain. Negative for palpitations.  Gastrointestinal:  Negative for abdominal pain, nausea and vomiting.  Endocrine: Negative for polydipsia and polyuria.  Genitourinary:  Negative for difficulty urinating and hematuria.  Musculoskeletal:  Negative for gait problem and joint swelling.  Skin:  Negative for pallor and rash.  Neurological:  Negative for syncope and headaches.  Psychiatric/Behavioral:  Negative for agitation and confusion.    Physical Exam Updated Vital Signs BP (!) 119/102   Pulse 93   Temp 98.3 F (36.8 C) (Oral)   Resp (!) 24   SpO2 100%    Physical Exam Vitals and nursing note reviewed.  Constitutional:      General: He is not in acute distress.    Appearance: Normal appearance. He is well-developed. He is not ill-appearing or diaphoretic.  HENT:     Head: Normocephalic and atraumatic.     Right Ear: External ear normal.     Left Ear: External ear normal.     Mouth/Throat:     Mouth: Mucous membranes are moist.  Eyes:     General: No scleral icterus. Cardiovascular:     Rate and Rhythm: Normal rate and regular rhythm.     Pulses: Normal pulses.     Heart sounds: Normal heart sounds.  Pulmonary:     Effort: Pulmonary effort is normal. No respiratory distress.     Breath sounds: Normal breath sounds.  Abdominal:     General: Abdomen is flat.     Palpations: Abdomen is soft.     Tenderness: There is no abdominal tenderness.  Musculoskeletal:        General: Normal range of motion.     Cervical back: Normal range of motion.     Right lower leg: No edema.     Left lower leg: No edema.  Skin:    General: Skin is warm and dry.     Capillary Refill: Capillary refill takes less than 2 seconds.  Neurological:     Mental Status: He is alert and oriented to person, place, and time.  Psychiatric:        Mood and Affect: Mood normal.        Behavior: Behavior normal.    ED Results / Procedures / Treatments   Labs (all labs ordered are listed, but only abnormal results are displayed) Labs Reviewed  BASIC METABOLIC PANEL - Abnormal; Notable for the following components:      Result Value   Sodium 131 (*)    Glucose, Bld 170 (*)    Creatinine, Ser 0.58 (*)    All other components within normal limits  URINALYSIS, ROUTINE W REFLEX MICROSCOPIC - Abnormal; Notable for the following components:   Glucose, UA 50 (*)    All other components within normal limits  CBG MONITORING, ED - Abnormal; Notable for the following components:   Glucose-Capillary 174 (*)    All other components within normal limits  RESP PANEL BY  RT-PCR (FLU A&B, COVID) ARPGX2  CBC  TROPONIN I (HIGH SENSITIVITY)  TROPONIN I (HIGH SENSITIVITY)    EKG EKG Interpretation  Date/Time:  Saturday February 04 2021 16:31:48 EDT Ventricular Rate:  95 PR Interval:  249 QRS Duration: 92 QT Interval:  347 QTC Calculation: 437 R Axis:   1 Text Interpretation: Sinus or ectopic atrial rhythm No significant change since last tracing Confirmed by Wynona Dove ([id number]) on 02/04/2021 10:39:23 PM  Radiology DG Chest 2 View  Result Date: 02/04/2021 CLINICAL DATA:  weakness  EXAM: CHEST - 2 VIEW COMPARISON:  August 13, 2012, April 12, 2020 FINDINGS: The heart is the upper limits of normal in size. Status post median sternotomy.Increased irregular contour in density of the RIGHT hilum. No pleural effusion. No pneumothorax. Scattered linear opacities at the lung bases most consistent with atelectasis/scar. Visualized abdomen is unremarkable. Wedging of the L1 vertebral body, similar in comparison to prior MRI dated July 19, 2020 IMPRESSION: 1. Increased irregular opacity along the RIGHT hilar border. While this could be due to summation artifact, consider further evaluation with dedicated chest CT with contrast for improved evaluation. 2. Similar appearance of a compression fracture of L1. Electronically Signed   By: Valentino Saxon M.D.   On: 02/04/2021 16:58   CT CHEST ABDOMEN PELVIS W CONTRAST  Result Date: 02/04/2021 CLINICAL DATA:  Abnormal chest x-ray. complains of weakness, elevated BP x3 days, reports epigastric pain and heartburn. Pt was orthostatic w/ EMS, initial BP 161/82, HR 100 sitting to standing at 143/82, HR 115. EKG showed tachy w/ EMS. EXAM: CT CHEST, ABDOMEN, AND PELVIS WITH CONTRAST TECHNIQUE: Multidetector CT imaging of the chest, abdomen and pelvis was performed following the standard protocol during bolus administration of intravenous contrast. CONTRAST:  22mL OMNIPAQUE IOHEXOL 350 MG/ML SOLN COMPARISON:  Chest x-ray 02/04/2021, CT  abdomen pelvis and chest 11/10/2007 FINDINGS: CT CHEST FINDINGS Cardiovascular: Normal heart size. No significant pericardial effusion. The thoracic aorta is normal in caliber. Mild atherosclerotic plaque of the thoracic aorta. Four-vessel coronary artery calcifications. Mediastinum/Nodes: No enlarged mediastinal, hilar, or axillary lymph nodes. Thyroid gland, trachea, and esophagus demonstrate no significant findings. Lungs/Pleura: Passive atelectasis along the paravertebral right lower lobe. Associated mild traction bronchiectasis and lung scarring. Bilateral lower lobe mild traction bronchiectasis. No focal consolidation. Few scattered pulmonary micronodules. No pulmonary mass. No pleural effusion. No pneumothorax. Musculoskeletal: No chest wall abnormality. No suspicious lytic or blastic osseous lesions. No acute displaced fracture. Multilevel degenerative changes of the spine. CT ABDOMEN PELVIS FINDINGS Hepatobiliary: Fluid density lesion within the liver measuring up to 2.2 cm likely represents a simple hepatic cyst (2:50). No focal liver abnormality. No gallstones, gallbladder wall thickening, or pericholecystic fluid. No biliary dilatation. Pancreas: No focal lesion. Normal pancreatic contour. No surrounding inflammatory changes. No main pancreatic ductal dilatation. Spleen: Normal in size without focal abnormality. Adrenals/Urinary Tract: No adrenal nodule bilaterally. Bilateral kidneys enhance symmetrically. There is a 1.3 cm fluid density lesion within left kidney likely represents a simple renal cyst. No hydronephrosis. No hydroureter. The urinary bladder is unremarkable. Stomach/Bowel: Stomach is within normal limits. No evidence of bowel wall thickening or dilatation. The appendix not definitely identified. Vascular/Lymphatic: No abdominal aorta or iliac aneurysm. Moderate atherosclerotic plaque of the aorta and its branches. No abdominal, pelvic, or inguinal lymphadenopathy. Reproductive: Prostate is  unremarkable. Other: No intraperitoneal free fluid. No intraperitoneal free gas. No organized fluid collection. Musculoskeletal: No abdominal wall hernia or abnormality. No suspicious lytic or blastic osseous lesions. No acute displaced fracture. Age-indeterminate compression fracture of the L1 vertebral body with greater than 75% height loss and associated 4 mm retropulsion into the central canal. IMPRESSION: 1. No acute intrathoracic, intra-abdominal, intrapelvic abnormality. 2. No correlation to chest x-ray finding which may have been due to superimposed patient rotation and atelectasis/lung scarring within the right lower lobe. 3. Age-indeterminate compression fracture of the L1 vertebral body with greater than 75% height loss and associated 4 mm retropulsion into the central canal. Correlate with point tenderness to palpation for an acute component. 4. Aortic Atherosclerosis (  ICD10-I70.0) including four-vessel coronary calcifications. Electronically Signed   By: Iven Finn M.D.   On: 02/04/2021 19:29    Procedures Procedures   Medications Ordered in ED Medications  famotidine (PEPCID) IVPB 20 mg premix (0 mg Intravenous Stopped 02/04/21 1955)  iohexol (OMNIPAQUE) 350 MG/ML injection 80 mL (80 mLs Intravenous Contrast Given 02/04/21 1856)    ED Course  I have reviewed the triage vital signs and the nursing notes.  Pertinent labs & imaging results that were available during my care of the patient were reviewed by me and considered in my medical decision making (see chart for details).    MDM Rules/Calculators/A&P                           CC:   This patient complains of multiple complaints; this involves an extensive number of treatment options and is a complaint that carries with it a high risk of complications and morbidity. Vital signs were reviewed. Serious etiologies considered.  Record review:   Previous records obtained and reviewed   Additional history obtained from  spouse, daughter  Work up as above, notable for:  Labs & imaging results that were available during my care of the patient were reviewed by me and considered in my medical decision making.   I ordered imaging studies which included CT chest,abdomen,pelvis and CXR and I independently visualized and interpreted imaging which showed CXR with irregular opacity to right hilar border, CT was obtained which did not show this. There was age indeterminate L1 compression fx, he has no pain at this area; favor non-acute process. This was noted on lumbar spine XR 12/4/2000Advised he f/u with PCP regarding this finding.   EKG without acute acute ischemia, troponin is negative, CXR stable, no ongoing chest pain. Symptoms resolved after pepcid. Heart score is 4. Favor atypical chest pain at this time.   Extensive discussion with family and patient at bedside. Patient will follow up with PCP regarding ongoing chronic issues.   The patient's chest pain is not suggestive of pulmonary embolus, cardiac ischemia, aortic dissection, pericarditis, myocarditis, pulmonary embolism, pneumothorax, pneumonia, Zoster, or esophageal perforation, or other serious etiology.  Historically not abrupt in onset, tearing or ripping, pulses symmetric. EKG nonspecific for ischemia/infarction. No dysrhythmias, brugada, WPW, prolonged QT noted. Troponin negative x2. CXR reviewed. Labs without demonstration of acute pathology unless otherwise noted above.   The patient improved significantly and was discharged in stable condition. Detailed discussions were had with the patient regarding current findings, and need for close f/u with PCP or on call doctor. The patient has been instructed to return immediately if the symptoms worsen in any way for re-evaluation. Patient verbalized understanding and is in agreement with current care plan. All questions answered prior to discharge.           This chart was dictated using voice recognition  software.  Despite best efforts to proofread,  errors can occur which can change the documentation meaning.  Final Clinical Impression(s) / ED Diagnoses Final diagnoses:  Abdominal pain  Atypical chest pain  Elevated blood pressure reading    Rx / DC Orders ED Discharge Orders     None        Jeanell Sparrow, DO 02/04/21 2244

## 2021-07-18 ENCOUNTER — Other Ambulatory Visit (HOSPITAL_BASED_OUTPATIENT_CLINIC_OR_DEPARTMENT_OTHER): Payer: Self-pay | Admitting: Family Medicine

## 2021-07-18 ENCOUNTER — Ambulatory Visit (HOSPITAL_BASED_OUTPATIENT_CLINIC_OR_DEPARTMENT_OTHER)
Admission: RE | Admit: 2021-07-18 | Discharge: 2021-07-18 | Disposition: A | Discharge status: Home / Self Care | Payer: Medicare Other | Source: Ambulatory Visit | Attending: Family Medicine | Admitting: Family Medicine

## 2021-07-18 DIAGNOSIS — I509 Heart failure, unspecified: Secondary | ICD-10-CM | POA: Diagnosis present

## 2022-03-19 ENCOUNTER — Ambulatory Visit: Payer: Medicare Other | Admitting: Orthopaedic Surgery

## 2022-03-19 ENCOUNTER — Ambulatory Visit (INDEPENDENT_AMBULATORY_CARE_PROVIDER_SITE_OTHER): Payer: Medicare Other

## 2022-03-19 ENCOUNTER — Other Ambulatory Visit: Payer: Self-pay

## 2022-03-19 DIAGNOSIS — G8929 Other chronic pain: Secondary | ICD-10-CM

## 2022-03-19 DIAGNOSIS — M79605 Pain in left leg: Secondary | ICD-10-CM

## 2022-03-19 DIAGNOSIS — M5442 Lumbago with sciatica, left side: Secondary | ICD-10-CM

## 2022-03-19 NOTE — Progress Notes (Signed)
The patient is a very pleasant 88 year old gentleman who comes in today with his daughter.  He ambulates with a cane and has been complaining of chronic low back pain this been going on for some time on the left side than the right and has had a sciatic component at 1 point.  He does have a history of a left intertrochanteric hip fracture that was fixed in [place] in 2020.  This was fixed with a intramedullary rod and hip screw.  He does ambulate with a cane.  His biggest complaint is back pain.  He also has a known L1 compression fracture that occurred over a year ago.  Recently he slipped and fell hitting his the toilet and he was having some pain on the left side of his hip and it hurt on that side lay down but that is subsided.  He is not on blood thinning medications.  He tries to stay active.  He has a long history of back issues.  He has never had any type of intervention in his back as it relates to the compression fracture has never had any type of injections.  His daughter said he does complain of his back in terms of pain almost on a daily basis and this is what is detrimentally affecting his quality of life more so than anything.  I was able to easily examine both hips and move smoothly and fluidly.  His left hip has a little bit of pain over the trochanteric area and I believe this is where the compression hip screw was placed.  He likely has a little bursitis and IT band irritation from this but he said he is not hurting today at all.  Most of his pain seems to be in the lower lumbar spine to the left side on exam.  Specially flexion extension and with palpation.  He does have decent strength in his bilateral extremities and no sensory deficits today.  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  There is a L1 compression fracture that is chronic and over 50% loss of height.  There are osteophytes at all levels as well.  He does have an  MRI on the canopy system of his lumbar spine that was done in April 2022.  It does show significant stenosis at L3-L4 and L4-L5 to the left side but also facet disease at that side which is significant.  An AP pelvis and lateral left hip shows normal-appearing hips bilaterally and no complicating features of the hardware.  There is no acute fracture or new fracture and his previous fracture healed of the intertrochanteric region.  I would like to send him to Dr. Ernestina Patches to consider left-sided facet injections at L3-L4 and L4-L5 given the fact that the patient is only having low back pain at this point and not a radicular component today.  He may eventually need an ESI at L3-L4 based on that MRI but right now it seems like it is more than just the back/lower lumbar spine to the left side that needs to be addressed.  He agrees with this referral to Dr. Ernestina Patches.  His daughter also agrees.

## 2022-03-20 ENCOUNTER — Other Ambulatory Visit: Payer: Self-pay | Admitting: Physical Medicine and Rehabilitation

## 2022-03-20 DIAGNOSIS — M47816 Spondylosis without myelopathy or radiculopathy, lumbar region: Secondary | ICD-10-CM

## 2022-03-20 DIAGNOSIS — M545 Low back pain, unspecified: Secondary | ICD-10-CM

## 2022-03-20 DIAGNOSIS — G8929 Other chronic pain: Secondary | ICD-10-CM

## 2022-03-22 ENCOUNTER — Telehealth: Payer: Self-pay | Admitting: Physical Medicine and Rehabilitation

## 2022-03-22 NOTE — Telephone Encounter (Signed)
Pt's daughter called requesting a call to set a referral appt. Please call Lattie Haw at [phone]

## 2022-03-23 NOTE — Telephone Encounter (Signed)
Spoke with daughter and informed her that the referral is still in process with insurance and we will give her a call once it has been approved. Verbalized understanding

## 2022-03-23 NOTE — Telephone Encounter (Signed)
Pt daughter called in stating that she called the insurance and the insurance company advised her that the auth the injection... Pt daughter is requesting an appt for pt as soon as possible because pt is in a lot of pain... Pt daughter ris requesting callback

## 2022-03-26 NOTE — Telephone Encounter (Signed)
Spoke with patient's daughter and scheduled injection for 04/02/22

## 2022-04-02 ENCOUNTER — Ambulatory Visit: Payer: Medicare Other | Admitting: Physical Medicine and Rehabilitation

## 2022-04-02 ENCOUNTER — Ambulatory Visit: Payer: Self-pay

## 2022-04-02 VITALS — BP 119/79 | HR 103

## 2022-04-02 DIAGNOSIS — M47816 Spondylosis without myelopathy or radiculopathy, lumbar region: Secondary | ICD-10-CM | POA: Diagnosis not present

## 2022-04-02 MED ORDER — BUPIVACAINE HCL 0.5 % IJ SOLN
3.0000 mL | Freq: Once | INTRAMUSCULAR | Status: AC
  Administered 2022-04-02: 3 mL

## 2022-04-02 NOTE — Patient Instructions (Signed)

## 2022-04-02 NOTE — Progress Notes (Signed)
Numeric Pain Rating Scale and Functional Assessment Average Pain 2   In the last MONTH (on 0-10 scale) has pain interfered with the following?  1. General activity like being  able to carry out your everyday physical activities such as walking, climbing stairs, carrying groceries, or moving a chair?  Rating( varies )   +Driver, -BT, -Dye Allergies.  Lower back pain across the back. Walking around makes pain worse

## 2022-04-11 NOTE — Progress Notes (Signed)
Jonathan Owen - 88 y.o. male MRN [id number]  Date of birth: 12/10/1927  Office Visit Note: Visit Date: 04/02/2022 PCP: Pcp, No Referred by: Mcarthur Rossetti*  Subjective: Chief Complaint  Patient presents with   Lower Back - Pain   HPI:  Jonathan Owen is a 88 y.o. male who comes in today at the request of Dr. Jean Rosenthal for planned Bilateral  L3-4 Lumbar facet/medial branch block with fluoroscopic guidance.  The patient has failed conservative care including home exercise, medications, time and activity modification.  This injection will be diagnostic and hopefully therapeutic.  Please see requesting physician notes for further details and justification.  Exam has shown concordant pain with facet joint loading.  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  Depending on relief could look at epidural injection versus different level of facet.  He was having more one-sided pain with Dr. Ninfa Linden but today is having bilateral pain.   ROS Otherwise per HPI.  Assessment & Plan: Visit Diagnoses:    ICD-10-CM   1. Spondylosis without myelopathy or radiculopathy, lumbar region  M47.816 XR C-ARM NO REPORT    Facet Injection    bupivacaine (MARCAINE) 0.5 % (with pres) injection 3 mL      Plan: No additional findings.   Meds & Orders:  Meds ordered this encounter  Medications   bupivacaine (MARCAINE) 0.5 % (with pres) injection 3 mL    Orders Placed This Encounter  Procedures   Facet Injection   XR C-ARM NO REPORT    Follow-up: Return for visit to requesting provider as needed.   Procedures: No procedures performed  Lumbar Facet Joint Intra-Articular Injection(s) with Fluoroscopic Guidance  Patient: Jonathan Owen      Date of Birth: 02-Jan-1928 MRN: [id number] PCP: Pcp, No      Visit Date: 04/02/2022   Universal Protocol:    Date/Time: 04/02/2022  Consent Given By: the patient  Position: PRONE   Additional  Comments: Vital signs were monitored before and after the procedure. Patient was prepped and draped in the usual sterile fashion. The correct patient, procedure, and site was verified.   Injection Procedure Details:  Procedure Site One Meds Administered:  Meds ordered this encounter  Medications   bupivacaine (MARCAINE) 0.5 % (with pres) injection 3 mL     Laterality: Bilateral  Location/Site:  L3-L4  Needle size: 22 guage  Needle type: Spinal  Needle Placement: Articular  Findings:  -Comments: Excellent flow of contrast producing a partial arthrogram.  Procedure Details: The fluoroscope beam is vertically oriented in AP, and the inferior recess is visualized beneath the lower pole of the inferior apophyseal process, which represents the target point for needle insertion. When direct visualization is difficult the target point is located at the medial projection of the vertebral pedicle. The region overlying each aforementioned target is locally anesthetized with a 1 to 2 ml. volume of 1% Lidocaine without Epinephrine.   The spinal needle was inserted into each of the above mentioned facet joints using biplanar fluoroscopic guidance. A 0.25 to 0.5 ml. volume of Isovue-250 was injected and a partial facet joint arthrogram was obtained. A single spot film was obtained of the resulting arthrogram.    One to 1.25 ml of the steroid/anesthetic solution was then injected into each of the facet joints noted above.   Additional Comments:  No complications occurred Dressing: 2 x 2 sterile gauze and Band-Aid    Post-procedure details: Patient  was observed during the procedure. Post-procedure instructions were reviewed.  Patient left the clinic in stable condition.    Clinical History: EXAM: MRI LUMBAR SPINE WITHOUT CONTRAST  TECHNIQUE: Multiplanar, multisequence MR imaging of the lumbar spine was performed. No intravenous contrast was administered.  COMPARISON: Lumbar  radiographs 07/13/2020. CT lumbar spine 04/04/2020  FINDINGS: Segmentation: Normal  Alignment: Mild kyphosis at the L1 level. Slight retrolisthesis L1-2, L2-3, L3-4.  Vertebrae: Approximately 50% compression fracture of L1 vertebral body involving the superior endplate. This fracture was present on the prior CT but has progressed significantly. No change from the recent radiographs. There is associated bone marrow edema and mild retropulsion of bone into the canal not causing significant spinal stenosis  No other fracture or mass lesion in the lumbar spine.  Conus medullaris and cauda equina: Conus extends to the L1-2 level. Conus and cauda equina appear normal.  Paraspinal and other soft tissues: Negative for paraspinous mass, adenopathy, or hematoma.  Disc levels:  T12-L1: Mild disc degeneration. Mild retropulsion L1 into the canal without significant stenosis  L1-2: Disc degeneration with asymmetric spurring on the right. Moderate subarticular and foraminal stenosis on the right. Spinal canal normal in size.  L2-3: Disc degeneration with disc space narrowing and endplate spurring. Mild subarticular stenosis bilaterally.  L3-4: Disc degeneration with disc space narrowing and diffuse endplate spurring. Bilateral facet hypertrophy. Moderate spinal stenosis. Moderate to severe subarticular and foraminal stenosis on the left. Moderate right subarticular stenosis.  L4-5: Moderate disc degeneration. Disc space narrowing and diffuse endplate spurring. Bilateral facet degeneration. Moderate subarticular stenosis bilaterally due to spurring  L5-S1: Disc degeneration with mild spurring. No significant stenosis.  IMPRESSION: 50% compression fracture L1 with bone marrow edema. Mild retropulsion of bone into the canal without significant stenosis. Fracture has progressed since the prior CT of 04/04/2020. Mild associated kyphosis.  Multilevel degenerative change throughout  the lumbar spine as described above.   Electronically Signed By: Franchot Gallo M.D. On: 07/20/2020 11:52     Objective:  VS:  HT:    WT:   BMI:     BP:119/79  HR:(!) 103bpm  TEMP: ( )  RESP:  Physical Exam Vitals and nursing note reviewed.  Constitutional:      General: He is not in acute distress.    Appearance: Normal appearance. He is not ill-appearing.  HENT:     Head: Normocephalic and atraumatic.     Right Ear: External ear normal.     Left Ear: External ear normal.     Nose: No congestion.  Eyes:     Extraocular Movements: Extraocular movements intact.  Cardiovascular:     Rate and Rhythm: Normal rate.     Pulses: Normal pulses.  Pulmonary:     Effort: Pulmonary effort is normal. No respiratory distress.  Abdominal:     General: There is no distension.     Palpations: Abdomen is soft.  Musculoskeletal:        General: No tenderness or signs of injury.     Cervical back: Neck supple.     Right lower leg: No edema.     Left lower leg: No edema.     Comments: Patient has good distal strength without clonus.  Skin:    Findings: No erythema or rash.  Neurological:     General: No focal deficit present.     Mental Status: He is alert and oriented to person, place, and time.     Sensory: No sensory deficit.  Motor: No weakness or abnormal muscle tone.     Coordination: Coordination normal.  Psychiatric:        Mood and Affect: Mood normal.        Behavior: Behavior normal.      Imaging: No results found.

## 2022-04-11 NOTE — Procedures (Signed)
Lumbar Facet Joint Intra-Articular Injection(s) with Fluoroscopic Guidance  Patient: Jonathan Owen      Date of Birth: June 14, 1927 MRN: [id number] PCP: Pcp, No      Visit Date: 04/02/2022   Universal Protocol:    Date/Time: 04/02/2022  Consent Given By: the patient  Position: PRONE   Additional Comments: Vital signs were monitored before and after the procedure. Patient was prepped and draped in the usual sterile fashion. The correct patient, procedure, and site was verified.   Injection Procedure Details:  Procedure Site One Meds Administered:  Meds ordered this encounter  Medications   bupivacaine (MARCAINE) 0.5 % (with pres) injection 3 mL     Laterality: Bilateral  Location/Site:  L3-L4  Needle size: 22 guage  Needle type: Spinal  Needle Placement: Articular  Findings:  -Comments: Excellent flow of contrast producing a partial arthrogram.  Procedure Details: The fluoroscope beam is vertically oriented in AP, and the inferior recess is visualized beneath the lower pole of the inferior apophyseal process, which represents the target point for needle insertion. When direct visualization is difficult the target point is located at the medial projection of the vertebral pedicle. The region overlying each aforementioned target is locally anesthetized with a 1 to 2 ml. volume of 1% Lidocaine without Epinephrine.   The spinal needle was inserted into each of the above mentioned facet joints using biplanar fluoroscopic guidance. A 0.25 to 0.5 ml. volume of Isovue-250 was injected and a partial facet joint arthrogram was obtained. A single spot film was obtained of the resulting arthrogram.    One to 1.25 ml of the steroid/anesthetic solution was then injected into each of the facet joints noted above.   Additional Comments:  No complications occurred Dressing: 2 x 2 sterile gauze and Band-Aid    Post-procedure details: Patient was observed during the  procedure. Post-procedure instructions were reviewed.  Patient left the clinic in stable condition.

## 2022-06-18 ENCOUNTER — Telehealth: Payer: Self-pay | Admitting: Physical Medicine and Rehabilitation

## 2022-06-18 NOTE — Telephone Encounter (Signed)
Patient's daughter called. She would like an appointment for him with Dr. Ernestina Patches. Her call back number is [phone]

## 2022-06-19 NOTE — Telephone Encounter (Signed)
LVM to return call to clinic.

## 2022-06-20 ENCOUNTER — Other Ambulatory Visit: Payer: Self-pay | Admitting: Physical Medicine and Rehabilitation

## 2022-06-20 DIAGNOSIS — M47816 Spondylosis without myelopathy or radiculopathy, lumbar region: Secondary | ICD-10-CM

## 2022-06-20 DIAGNOSIS — G8929 Other chronic pain: Secondary | ICD-10-CM

## 2022-06-20 NOTE — Telephone Encounter (Signed)
Patient's daughter called and stated her father is starting to complain about pain. The injection worked great until a couple weeks ago. He got facet injections 04/02/22. Please advise

## 2022-06-27 ENCOUNTER — Other Ambulatory Visit (HOSPITAL_BASED_OUTPATIENT_CLINIC_OR_DEPARTMENT_OTHER): Payer: Self-pay | Admitting: Family Medicine

## 2022-06-27 DIAGNOSIS — M810 Age-related osteoporosis without current pathological fracture: Secondary | ICD-10-CM

## 2022-07-06 ENCOUNTER — Other Ambulatory Visit: Payer: Self-pay

## 2022-07-06 ENCOUNTER — Ambulatory Visit: Payer: Medicare Other | Admitting: Physical Medicine and Rehabilitation

## 2022-07-06 VITALS — BP 134/82 | HR 120

## 2022-07-06 DIAGNOSIS — M47816 Spondylosis without myelopathy or radiculopathy, lumbar region: Secondary | ICD-10-CM | POA: Diagnosis not present

## 2022-07-06 MED ORDER — METHYLPREDNISOLONE ACETATE 80 MG/ML IJ SUSP
80.0000 mg | Freq: Once | INTRAMUSCULAR | Status: AC
  Administered 2022-07-06: 80 mg

## 2022-07-06 NOTE — Progress Notes (Signed)
Functional Pain Scale - descriptive words and definitions  Moderate (4)   Constantly aware of pain, can complete ADLs with modification/sleep marginally affected at times/passive distraction is of no use, but active distraction gives some relief. Moderate range order  Average Pain 2   +Driver, -BT, -Dye Allergies.  Lower back pain on both sides

## 2022-07-06 NOTE — Patient Instructions (Signed)

## 2022-07-23 NOTE — Progress Notes (Signed)
Jonathan Owen - 89 y.o. male MRN [id number]  Date of birth: [birth year]  Office Visit Note: Visit Date: 07/06/2022 PCP: Pcp, No Referred by: Juanda Chance, NP  Subjective: Chief Complaint  Patient presents with   Lower Back - Pain   HPI:  Jonathan Owen is a 89 y.o. male who comes in today for planned repeat Bilateral L3-4 Lumbar facet/medial branch block with fluoroscopic guidance.  The patient has failed conservative care including home exercise, medications, time and activity modification.  This injection will be diagnostic and hopefully therapeutic.  Please see requesting physician notes for further details and justification.  Exam shows concordant low back pain with facet joint loading and extension. Patient received more than 80% pain relief from prior injection. This would be the second block in a diagnostic double block paradigm.     Referring:Megan Mayford Knife, FNP   ROS Otherwise per HPI.  Assessment & Plan: Visit Diagnoses:    ICD-10-CM   1. Spondylosis without myelopathy or radiculopathy, lumbar region  M47.816 Facet Injection    methylPREDNISolone acetate (DEPO-MEDROL) injection 80 mg    XR C-ARM NO REPORT    CANCELED: XR C-ARM NO REPORT      Plan: No additional findings.   Meds & Orders:  Meds ordered this encounter  Medications   methylPREDNISolone acetate (DEPO-MEDROL) injection 80 mg    Orders Placed This Encounter  Procedures   Facet Injection   XR C-ARM NO REPORT    Follow-up: Return for visit to requesting provider as needed.   Procedures: No procedures performed  Lumbar Facet Joint Intra-Articular Injection(s) with Fluoroscopic Guidance  Patient: Jonathan Owen      Date of Birth: July 04, 1927 MRN: [id number] PCP: Pcp, No      Visit Date: 07/06/2022   Universal Protocol:    Date/Time: 07/06/2022  Consent Given By: the patient  Position: PRONE   Additional Comments: Vital signs were monitored before and after the  procedure. Patient was prepped and draped in the usual sterile fashion. The correct patient, procedure, and site was verified.   Injection Procedure Details:  Procedure Site One Meds Administered:  Meds ordered this encounter  Medications   methylPREDNISolone acetate (DEPO-MEDROL) injection 80 mg     Laterality: Bilateral  Location/Site:  L3-L4  Needle size: 22 guage  Needle type: Spinal  Needle Placement: Articular  Findings:  -Comments: Excellent flow of contrast producing a partial arthrogram.  Procedure Details: The fluoroscope beam is vertically oriented in AP, and the inferior recess is visualized beneath the lower pole of the inferior apophyseal process, which represents the target point for needle insertion. When direct visualization is difficult the target point is located at the medial projection of the vertebral pedicle. The region overlying each aforementioned target is locally anesthetized with a 1 to 2 ml. volume of 1% Lidocaine without Epinephrine.   The spinal needle was inserted into each of the above mentioned facet joints using biplanar fluoroscopic guidance. A 0.25 to 0.5 ml. volume of Isovue-250 was injected and a partial facet joint arthrogram was obtained. A single spot film was obtained of the resulting arthrogram.    One to 1.25 ml of the steroid/anesthetic solution was then injected into each of the facet joints noted above.   Additional Comments:  No complications occurred Dressing: 2 x 2 sterile gauze and Band-Aid    Post-procedure details: Patient was observed during the procedure. Post-procedure instructions were reviewed.  Patient left the clinic in stable condition.  Clinical History: EXAM: MRI LUMBAR SPINE WITHOUT CONTRAST  TECHNIQUE: Multiplanar, multisequence MR imaging of the lumbar spine was performed. No intravenous contrast was administered.  COMPARISON: Lumbar radiographs 07/13/2020. CT lumbar  spine 04/04/2020  FINDINGS: Segmentation: Normal  Alignment: Mild kyphosis at the L1 level. Slight retrolisthesis L1-2, L2-3, L3-4.  Vertebrae: Approximately 50% compression fracture of L1 vertebral body involving the superior endplate. This fracture was present on the prior CT but has progressed significantly. No change from the recent radiographs. There is associated bone marrow edema and mild retropulsion of bone into the canal not causing significant spinal stenosis  No other fracture or mass lesion in the lumbar spine.  Conus medullaris and cauda equina: Conus extends to the L1-2 level. Conus and cauda equina appear normal.  Paraspinal and other soft tissues: Negative for paraspinous mass, adenopathy, or hematoma.  Disc levels:  T12-L1: Mild disc degeneration. Mild retropulsion L1 into the canal without significant stenosis  L1-2: Disc degeneration with asymmetric spurring on the right. Moderate subarticular and foraminal stenosis on the right. Spinal canal normal in size.  L2-3: Disc degeneration with disc space narrowing and endplate spurring. Mild subarticular stenosis bilaterally.  L3-4: Disc degeneration with disc space narrowing and diffuse endplate spurring. Bilateral facet hypertrophy. Moderate spinal stenosis. Moderate to severe subarticular and foraminal stenosis on the left. Moderate right subarticular stenosis.  L4-5: Moderate disc degeneration. Disc space narrowing and diffuse endplate spurring. Bilateral facet degeneration. Moderate subarticular stenosis bilaterally due to spurring  L5-S1: Disc degeneration with mild spurring. No significant stenosis.  IMPRESSION: 50% compression fracture L1 with bone marrow edema. Mild retropulsion of bone into the canal without significant stenosis. Fracture has progressed since the prior CT of 04/04/2020. Mild associated kyphosis.  Multilevel degenerative change throughout the lumbar spine as described  above.   Electronically Signed By: Marlan Palau M.D. On: 07/20/2020 11:52     Objective:  VS:  HT:    WT:   BMI:     BP:134/82  HR:(!) 120bpm  TEMP: ( )  RESP:  Physical Exam Vitals and nursing note reviewed.  Constitutional:      General: He is not in acute distress.    Appearance: Normal appearance. He is not ill-appearing.  HENT:     Head: Normocephalic and atraumatic.     Right Ear: External ear normal.     Left Ear: External ear normal.     Nose: No congestion.  Eyes:     Extraocular Movements: Extraocular movements intact.  Cardiovascular:     Rate and Rhythm: Normal rate.     Pulses: Normal pulses.  Pulmonary:     Effort: Pulmonary effort is normal. No respiratory distress.  Abdominal:     General: There is no distension.     Palpations: Abdomen is soft.  Musculoskeletal:        General: No tenderness or signs of injury.     Cervical back: Neck supple.     Right lower leg: No edema.     Left lower leg: No edema.     Comments: Patient has good distal strength without clonus.  Skin:    Findings: No erythema or rash.  Neurological:     General: No focal deficit present.     Mental Status: He is alert and oriented to person, place, and time.     Sensory: No sensory deficit.     Motor: No weakness or abnormal muscle tone.     Coordination: Coordination normal.  Psychiatric:  Mood and Affect: Mood normal.        Behavior: Behavior normal.      Imaging: No results found.

## 2022-07-23 NOTE — Procedures (Signed)
Lumbar Facet Joint Intra-Articular Injection(s) with Fluoroscopic Guidance  Patient: Jonathan Owen      Date of Birth: July 19, 1927 MRN: [id number] PCP: Pcp, No      Visit Date: 07/06/2022   Universal Protocol:    Date/Time: 07/06/2022  Consent Given By: the patient  Position: PRONE   Additional Comments: Vital signs were monitored before and after the procedure. Patient was prepped and draped in the usual sterile fashion. The correct patient, procedure, and site was verified.   Injection Procedure Details:  Procedure Site One Meds Administered:  Meds ordered this encounter  Medications   methylPREDNISolone acetate (DEPO-MEDROL) injection 80 mg     Laterality: Bilateral  Location/Site:  L3-L4  Needle size: 22 guage  Needle type: Spinal  Needle Placement: Articular  Findings:  -Comments: Excellent flow of contrast producing a partial arthrogram.  Procedure Details: The fluoroscope beam is vertically oriented in AP, and the inferior recess is visualized beneath the lower pole of the inferior apophyseal process, which represents the target point for needle insertion. When direct visualization is difficult the target point is located at the medial projection of the vertebral pedicle. The region overlying each aforementioned target is locally anesthetized with a 1 to 2 ml. volume of 1% Lidocaine without Epinephrine.   The spinal needle was inserted into each of the above mentioned facet joints using biplanar fluoroscopic guidance. A 0.25 to 0.5 ml. volume of Isovue-250 was injected and a partial facet joint arthrogram was obtained. A single spot film was obtained of the resulting arthrogram.    One to 1.25 ml of the steroid/anesthetic solution was then injected into each of the facet joints noted above.   Additional Comments:  No complications occurred Dressing: 2 x 2 sterile gauze and Band-Aid    Post-procedure details: Patient was observed during the  procedure. Post-procedure instructions were reviewed.  Patient left the clinic in stable condition.

## 2022-07-31 ENCOUNTER — Telehealth: Payer: Self-pay | Admitting: Physical Medicine and Rehabilitation

## 2022-07-31 NOTE — Telephone Encounter (Signed)
Patient's daughter called. Says he is in a lot of pain. CB# is [phone]

## 2022-08-02 NOTE — Telephone Encounter (Signed)
LVM to return call to get more information 

## 2022-08-03 ENCOUNTER — Other Ambulatory Visit: Payer: Self-pay | Admitting: Physical Medicine and Rehabilitation

## 2022-08-03 DIAGNOSIS — M47816 Spondylosis without myelopathy or radiculopathy, lumbar region: Secondary | ICD-10-CM

## 2022-08-03 DIAGNOSIS — M545 Low back pain, unspecified: Secondary | ICD-10-CM

## 2022-08-03 NOTE — Telephone Encounter (Signed)
Spoke with patient's daughter and she stated the patient needs a repeat injection. Spoke with Ellin Goodie, NP and she placed order for repeat injection

## 2022-08-03 NOTE — Progress Notes (Signed)
Spoke with patients daughter, significant relief of pain with recent bilateral L3-L4 facet joint injections, greater than 80% for over 3 weeks. Pain gradually starting to return. I will place order for repeat. Could also look at [facility] as there is moderate spinal canal stenosis at L3-L4. No radicular symptoms noted at this, he c/o severe bilateral lower back pain.

## 2022-08-08 ENCOUNTER — Ambulatory Visit (HOSPITAL_BASED_OUTPATIENT_CLINIC_OR_DEPARTMENT_OTHER)
Admission: RE | Admit: 2022-08-08 | Discharge: 2022-08-08 | Disposition: A | Discharge status: Home / Self Care | Payer: Medicare Other | Source: Ambulatory Visit | Attending: Family Medicine | Admitting: Family Medicine

## 2022-08-08 ENCOUNTER — Ambulatory Visit (INDEPENDENT_AMBULATORY_CARE_PROVIDER_SITE_OTHER): Payer: Medicare Other | Admitting: Physical Medicine and Rehabilitation

## 2022-08-08 ENCOUNTER — Other Ambulatory Visit: Payer: Self-pay

## 2022-08-08 VITALS — BP 158/88 | HR 93

## 2022-08-08 DIAGNOSIS — M47816 Spondylosis without myelopathy or radiculopathy, lumbar region: Secondary | ICD-10-CM

## 2022-08-08 DIAGNOSIS — M810 Age-related osteoporosis without current pathological fracture: Secondary | ICD-10-CM | POA: Insufficient documentation

## 2022-08-08 MED ORDER — METHYLPREDNISOLONE ACETATE 80 MG/ML IJ SUSP
80.0000 mg | Freq: Once | INTRAMUSCULAR | Status: AC
Start: 2022-08-08 — End: 2022-08-08
  Administered 2022-08-08: 80 mg

## 2022-08-08 NOTE — Patient Instructions (Signed)

## 2022-08-08 NOTE — Progress Notes (Signed)
Functional Pain Scale - descriptive words and definitions  Moderate (4)   Constantly aware of pain, can complete ADLs with modification/sleep marginally affected at times/passive distraction is of no use, but active distraction gives some relief. Moderate range order  Average Pain 8   +Driver, -BT, -Dye Allergies.  Lower back pain all the way across the back that can radiate into both legs

## 2022-08-14 NOTE — Progress Notes (Signed)
Jonathan Owen - 89 y.o. male MRN [id number]  Date of birth: 1928-02-04  Office Visit Note: Visit Date: 08/08/2022 PCP: Pcp, No Referred by: Juanda Chance, NP  Subjective: Chief Complaint  Patient presents with   Lower Back - Pain   HPI:  Jonathan Owen is a 89 y.o. male who comes in today for planned repeat Bilateral L3-4 Lumbar facet/medial branch block with fluoroscopic guidance.  The patient has failed conservative care including home exercise, medications, time and activity modification.  This injection will be diagnostic and hopefully therapeutic.  Please see requesting physician notes for further details and justification.  Exam shows concordant low back pain with facet joint loading and extension. Patient received more than 80% pain relief from prior injection. This would be the second block in a diagnostic double block paradigm.   He is somewhat of a poor historian with his age and he did get some relief with facet joint blocks.  He does have some level of stenosis which is moderate.  Would look at bilateral L3 transforaminal injection in the future.   Referring:Dr. Doneen Poisson   ROS Otherwise per HPI.  Assessment & Plan: Visit Diagnoses:    ICD-10-CM   1. Spondylosis without myelopathy or radiculopathy, lumbar region  M47.816 XR C-ARM NO REPORT    Facet Injection    methylPREDNISolone acetate (DEPO-MEDROL) injection 80 mg      Plan: No additional findings.   Meds & Orders:  Meds ordered this encounter  Medications   methylPREDNISolone acetate (DEPO-MEDROL) injection 80 mg    Orders Placed This Encounter  Procedures   Facet Injection   XR C-ARM NO REPORT    Follow-up: Return for visit to requesting provider as needed.   Procedures: No procedures performed  Lumbar Facet Joint Intra-Articular Injection(s) with Fluoroscopic Guidance  Patient: Jonathan Owen      Date of Birth: [birth year] MRN: [id number] PCP: Pcp, No      Visit Date:  08/08/2022   Universal Protocol:    Date/Time: 08/08/2022  Consent Given By: the patient  Position: PRONE   Additional Comments: Vital signs were monitored before and after the procedure. Patient was prepped and draped in the usual sterile fashion. The correct patient, procedure, and site was verified.   Injection Procedure Details:  Procedure Site One Meds Administered:  Meds ordered this encounter  Medications   methylPREDNISolone acetate (DEPO-MEDROL) injection 80 mg     Laterality: Bilateral  Location/Site:  L3-L4  Needle size: 22 guage  Needle type: Spinal  Needle Placement: Articular  Findings:  -Comments: Excellent flow of contrast producing a partial arthrogram.  Procedure Details: The fluoroscope beam is vertically oriented in AP, and the inferior recess is visualized beneath the lower pole of the inferior apophyseal process, which represents the target point for needle insertion. When direct visualization is difficult the target point is located at the medial projection of the vertebral pedicle. The region overlying each aforementioned target is locally anesthetized with a 1 to 2 ml. volume of 1% Lidocaine without Epinephrine.   The spinal needle was inserted into each of the above mentioned facet joints using biplanar fluoroscopic guidance. A 0.25 to 0.5 ml. volume of Isovue-250 was injected and a partial facet joint arthrogram was obtained. A single spot film was obtained of the resulting arthrogram.    One to 1.25 ml of the steroid/anesthetic solution was then injected into each of the facet joints noted above.   Additional Comments:  No complications  occurred Dressing: 2 x 2 sterile gauze and Band-Aid    Post-procedure details: Patient was observed during the procedure. Post-procedure instructions were reviewed.  Patient left the clinic in stable condition.    Clinical History: EXAM: MRI LUMBAR SPINE WITHOUT CONTRAST  TECHNIQUE: Multiplanar,  multisequence MR imaging of the lumbar spine was performed. No intravenous contrast was administered.  COMPARISON: Lumbar radiographs 07/13/2020. CT lumbar spine 04/04/2020  FINDINGS: Segmentation: Normal  Alignment: Mild kyphosis at the L1 level. Slight retrolisthesis L1-2, L2-3, L3-4.  Vertebrae: Approximately 50% compression fracture of L1 vertebral body involving the superior endplate. This fracture was present on the prior CT but has progressed significantly. No change from the recent radiographs. There is associated bone marrow edema and mild retropulsion of bone into the canal not causing significant spinal stenosis  No other fracture or mass lesion in the lumbar spine.  Conus medullaris and cauda equina: Conus extends to the L1-2 level. Conus and cauda equina appear normal.  Paraspinal and other soft tissues: Negative for paraspinous mass, adenopathy, or hematoma.  Disc levels:  T12-L1: Mild disc degeneration. Mild retropulsion L1 into the canal without significant stenosis  L1-2: Disc degeneration with asymmetric spurring on the right. Moderate subarticular and foraminal stenosis on the right. Spinal canal normal in size.  L2-3: Disc degeneration with disc space narrowing and endplate spurring. Mild subarticular stenosis bilaterally.  L3-4: Disc degeneration with disc space narrowing and diffuse endplate spurring. Bilateral facet hypertrophy. Moderate spinal stenosis. Moderate to severe subarticular and foraminal stenosis on the left. Moderate right subarticular stenosis.  L4-5: Moderate disc degeneration. Disc space narrowing and diffuse endplate spurring. Bilateral facet degeneration. Moderate subarticular stenosis bilaterally due to spurring  L5-S1: Disc degeneration with mild spurring. No significant stenosis.  IMPRESSION: 50% compression fracture L1 with bone marrow edema. Mild retropulsion of bone into the canal without significant  stenosis. Fracture has progressed since the prior CT of 04/04/2020. Mild associated kyphosis.  Multilevel degenerative change throughout the lumbar spine as described above.   Electronically Signed By: Marlan Palau M.D. On: 07/20/2020 11:52     Objective:  VS:  HT:    WT:   BMI:     BP:(!) 158/88  HR:93bpm  TEMP: ( )  RESP:  Physical Exam Vitals and nursing note reviewed.  Constitutional:      General: He is not in acute distress.    Appearance: Normal appearance. He is not ill-appearing.  HENT:     Head: Normocephalic and atraumatic.     Right Ear: External ear normal.     Left Ear: External ear normal.     Nose: No congestion.  Eyes:     Extraocular Movements: Extraocular movements intact.  Cardiovascular:     Rate and Rhythm: Normal rate.     Pulses: Normal pulses.  Pulmonary:     Effort: Pulmonary effort is normal. No respiratory distress.  Abdominal:     General: There is no distension.     Palpations: Abdomen is soft.  Musculoskeletal:        General: No tenderness or signs of injury.     Cervical back: Neck supple.     Right lower leg: No edema.     Left lower leg: No edema.     Comments: Patient has good distal strength without clonus.  Skin:    Findings: No erythema or rash.  Neurological:     General: No focal deficit present.     Mental Status: He is alert and oriented to  person, place, and time.     Sensory: No sensory deficit.     Motor: No weakness or abnormal muscle tone.     Coordination: Coordination normal.  Psychiatric:        Mood and Affect: Mood normal.        Behavior: Behavior normal.      Imaging: No results found.

## 2022-08-14 NOTE — Procedures (Signed)
Lumbar Facet Joint Intra-Articular Injection(s) with Fluoroscopic Guidance  Patient: Jonathan Owen      Date of Birth: 1928/01/04 MRN: [id number] PCP: Pcp, No      Visit Date: 08/08/2022   Universal Protocol:    Date/Time: 08/08/2022  Consent Given By: the patient  Position: PRONE   Additional Comments: Vital signs were monitored before and after the procedure. Patient was prepped and draped in the usual sterile fashion. The correct patient, procedure, and site was verified.   Injection Procedure Details:  Procedure Site One Meds Administered:  Meds ordered this encounter  Medications   methylPREDNISolone acetate (DEPO-MEDROL) injection 80 mg     Laterality: Bilateral  Location/Site:  L3-L4  Needle size: 22 guage  Needle type: Spinal  Needle Placement: Articular  Findings:  -Comments: Excellent flow of contrast producing a partial arthrogram.  Procedure Details: The fluoroscope beam is vertically oriented in AP, and the inferior recess is visualized beneath the lower pole of the inferior apophyseal process, which represents the target point for needle insertion. When direct visualization is difficult the target point is located at the medial projection of the vertebral pedicle. The region overlying each aforementioned target is locally anesthetized with a 1 to 2 ml. volume of 1% Lidocaine without Epinephrine.   The spinal needle was inserted into each of the above mentioned facet joints using biplanar fluoroscopic guidance. A 0.25 to 0.5 ml. volume of Isovue-250 was injected and a partial facet joint arthrogram was obtained. A single spot film was obtained of the resulting arthrogram.    One to 1.25 ml of the steroid/anesthetic solution was then injected into each of the facet joints noted above.   Additional Comments:  No complications occurred Dressing: 2 x 2 sterile gauze and Band-Aid    Post-procedure details: Patient was observed during the  procedure. Post-procedure instructions were reviewed.  Patient left the clinic in stable condition.

## 2022-09-03 ENCOUNTER — Other Ambulatory Visit: Payer: Self-pay | Admitting: Physical Medicine and Rehabilitation

## 2022-09-03 ENCOUNTER — Telehealth: Payer: Self-pay | Admitting: Orthopedic Surgery

## 2022-09-03 DIAGNOSIS — M48062 Spinal stenosis, lumbar region with neurogenic claudication: Secondary | ICD-10-CM

## 2022-09-03 NOTE — Telephone Encounter (Signed)
[name] is calling on behalf of her father, Jonathan Owen.  She states that it has been about a month since his last injection and he advised her that his back pain has increased significantly.  She would like to know what the next step is.  Does he get another injection, or just need an exam.  Please call her back to discuss at [phone].

## 2022-09-11 ENCOUNTER — Telehealth: Payer: Self-pay | Admitting: Physical Medicine and Rehabilitation

## 2022-09-11 NOTE — Telephone Encounter (Signed)
Spoke with patient's daughter and scheduled injection for 09/13/22. Patient aware driver needed

## 2022-09-11 NOTE — Telephone Encounter (Signed)
Patient daughter asking when he can be seen to get his injection and he is also in pain and want to speak to Dr. Alvester Morin or [place]

## 2022-09-13 ENCOUNTER — Ambulatory Visit: Payer: Medicare Other | Admitting: Physical Medicine and Rehabilitation

## 2022-09-13 ENCOUNTER — Other Ambulatory Visit: Payer: Self-pay

## 2022-09-13 VITALS — BP 146/81 | HR 85

## 2022-09-13 DIAGNOSIS — M48062 Spinal stenosis, lumbar region with neurogenic claudication: Secondary | ICD-10-CM | POA: Diagnosis not present

## 2022-09-13 HISTORY — DX: Spinal stenosis, lumbar region with neurogenic claudication: M48.062

## 2022-09-13 MED ORDER — METHYLPREDNISOLONE ACETATE 80 MG/ML IJ SUSP
80.0000 mg | Freq: Once | INTRAMUSCULAR | Status: AC
Start: 2022-09-13 — End: 2022-09-13
  Administered 2022-09-13: 80 mg

## 2022-09-13 NOTE — Progress Notes (Signed)
Jonathan Owen - 89 y.o. male MRN [id number]  Date of birth: July 30, 1927  Office Visit Note: Visit Date: 09/13/2022 PCP: Pcp, No Referred by: Juanda Chance, NP  Subjective: No chief complaint on file.  HPI:  Jonathan Owen is a 89 y.o. male who comes in today at the request of Ellin Goodie, FNP for planned Left L3-4 Lumbar Interlaminar epidural steroid injection with fluoroscopic guidance.  The patient has failed conservative care including home exercise, medications, time and activity modification.  This injection will be diagnostic and hopefully therapeutic.  Please see requesting physician notes for further details and justification.  ROS Otherwise per HPI.  Assessment & Plan: Visit Diagnoses:    ICD-10-CM   1. Spinal stenosis of lumbar region with neurogenic claudication  M48.062 XR C-ARM NO REPORT    Epidural Steroid injection    methylPREDNISolone acetate (DEPO-MEDROL) injection 80 mg      Plan: No additional findings.   Meds & Orders:  Meds ordered this encounter  Medications   methylPREDNISolone acetate (DEPO-MEDROL) injection 80 mg    Orders Placed This Encounter  Procedures   XR C-ARM NO REPORT   Epidural Steroid injection    Follow-up: Return if symptoms worsen or fail to improve.   Procedures: No procedures performed  Lumbar Epidural Steroid Injection - Interlaminar Approach with Fluoroscopic Guidance  Patient: Jonathan Owen      Date of Birth: 08/30/1927 MRN: [id number] PCP: Pcp, No      Visit Date: 09/13/2022   Universal Protocol:     Consent Given By: the patient  Position: PRONE  Additional Comments: Vital signs were monitored before and after the procedure. Patient was prepped and draped in the usual sterile fashion. The correct patient, procedure, and site was verified.   Injection Procedure Details:   Procedure diagnoses: Spinal stenosis of lumbar region with neurogenic claudication [M48.062]   Meds Administered:  Meds  ordered this encounter  Medications   methylPREDNISolone acetate (DEPO-MEDROL) injection 80 mg     Laterality: Left  Location/Site:  L3-4  Needle: 3.5 in., 20 ga. Tuohy  Needle Placement: Paramedian epidural  Findings:   -Comments: Excellent flow of contrast into the epidural space.  Procedure Details: Using a paramedian approach from the side mentioned above, the region overlying the inferior lamina was localized under fluoroscopic visualization and the soft tissues overlying this structure were infiltrated with 4 ml. of 1% Lidocaine without Epinephrine. The Tuohy needle was inserted into the epidural space using a paramedian approach.   The epidural space was localized using loss of resistance along with counter oblique bi-planar fluoroscopic views.  After negative aspirate for air, blood, and CSF, a 2 ml. volume of Isovue-250 was injected into the epidural space and the flow of contrast was observed. Radiographs were obtained for documentation purposes.    The injectate was administered into the level noted above.   Additional Comments:  No complications occurred Dressing: 2 x 2 sterile gauze and Band-Aid    Post-procedure details: Patient was observed during the procedure. Post-procedure instructions were reviewed.  Patient left the clinic in stable condition.   Clinical History: EXAM: MRI LUMBAR SPINE WITHOUT CONTRAST  TECHNIQUE: Multiplanar, multisequence MR imaging of the lumbar spine was performed. No intravenous contrast was administered.  COMPARISON: Lumbar radiographs 07/13/2020. CT lumbar spine 04/04/2020  FINDINGS: Segmentation: Normal  Alignment: Mild kyphosis at the L1 level. Slight retrolisthesis L1-2, L2-3, L3-4.  Vertebrae: Approximately 50% compression fracture of L1 vertebral body involving the superior  endplate. This fracture was present on the prior CT but has progressed significantly. No change from the recent radiographs. There is  associated bone marrow edema and mild retropulsion of bone into the canal not causing significant spinal stenosis  No other fracture or mass lesion in the lumbar spine.  Conus medullaris and cauda equina: Conus extends to the L1-2 level. Conus and cauda equina appear normal.  Paraspinal and other soft tissues: Negative for paraspinous mass, adenopathy, or hematoma.  Disc levels:  T12-L1: Mild disc degeneration. Mild retropulsion L1 into the canal without significant stenosis  L1-2: Disc degeneration with asymmetric spurring on the right. Moderate subarticular and foraminal stenosis on the right. Spinal canal normal in size.  L2-3: Disc degeneration with disc space narrowing and endplate spurring. Mild subarticular stenosis bilaterally.  L3-4: Disc degeneration with disc space narrowing and diffuse endplate spurring. Bilateral facet hypertrophy. Moderate spinal stenosis. Moderate to severe subarticular and foraminal stenosis on the left. Moderate right subarticular stenosis.  L4-5: Moderate disc degeneration. Disc space narrowing and diffuse endplate spurring. Bilateral facet degeneration. Moderate subarticular stenosis bilaterally due to spurring  L5-S1: Disc degeneration with mild spurring. No significant stenosis.  IMPRESSION: 50% compression fracture L1 with bone marrow edema. Mild retropulsion of bone into the canal without significant stenosis. Fracture has progressed since the prior CT of 04/04/2020. Mild associated kyphosis.  Multilevel degenerative change throughout the lumbar spine as described above.   Electronically Signed By: Marlan Palau M.D. On: 07/20/2020 11:52     Objective:  VS:  HT:    WT:   BMI:     BP:   HR: bpm  TEMP: ( )  RESP:  Physical Exam Vitals and nursing note reviewed.  Constitutional:      General: He is not in acute distress.    Appearance: Normal appearance. He is not ill-appearing.  HENT:     Head: Normocephalic and  atraumatic.     Right Ear: External ear normal.     Left Ear: External ear normal.     Nose: No congestion.  Eyes:     Extraocular Movements: Extraocular movements intact.  Cardiovascular:     Rate and Rhythm: Normal rate.     Pulses: Normal pulses.  Pulmonary:     Effort: Pulmonary effort is normal. No respiratory distress.  Abdominal:     General: There is no distension.     Palpations: Abdomen is soft.  Musculoskeletal:        General: No tenderness or signs of injury.     Cervical back: Neck supple.     Right lower leg: No edema.     Left lower leg: No edema.     Comments: Patient has good distal strength without clonus.  Skin:    Findings: No erythema or rash.  Neurological:     General: No focal deficit present.     Mental Status: He is alert and oriented to person, place, and time.     Sensory: No sensory deficit.     Motor: No weakness or abnormal muscle tone.     Coordination: Coordination normal.  Psychiatric:        Mood and Affect: Mood normal.        Behavior: Behavior normal.     Imaging: No results found.

## 2022-09-13 NOTE — Patient Instructions (Signed)

## 2022-09-13 NOTE — Procedures (Signed)
Lumbar Epidural Steroid Injection - Interlaminar Approach with Fluoroscopic Guidance  Patient: Jonathan Owen      Date of Birth: [birth year] MRN: [id number] PCP: Pcp, No      Visit Date: 09/13/2022   Universal Protocol:     Consent Given By: the patient  Position: PRONE  Additional Comments: Vital signs were monitored before and after the procedure. Patient was prepped and draped in the usual sterile fashion. The correct patient, procedure, and site was verified.   Injection Procedure Details:   Procedure diagnoses: Spinal stenosis of lumbar region with neurogenic claudication [M48.062]   Meds Administered:  Meds ordered this encounter  Medications   methylPREDNISolone acetate (DEPO-MEDROL) injection 80 mg     Laterality: Left  Location/Site:  L3-4  Needle: 3.5 in., 20 ga. Tuohy  Needle Placement: Paramedian epidural  Findings:   -Comments: Excellent flow of contrast into the epidural space.  Procedure Details: Using a paramedian approach from the side mentioned above, the region overlying the inferior lamina was localized under fluoroscopic visualization and the soft tissues overlying this structure were infiltrated with 4 ml. of 1% Lidocaine without Epinephrine. The Tuohy needle was inserted into the epidural space using a paramedian approach.   The epidural space was localized using loss of resistance along with counter oblique bi-planar fluoroscopic views.  After negative aspirate for air, blood, and CSF, a 2 ml. volume of Isovue-250 was injected into the epidural space and the flow of contrast was observed. Radiographs were obtained for documentation purposes.    The injectate was administered into the level noted above.   Additional Comments:  No complications occurred Dressing: 2 x 2 sterile gauze and Band-Aid    Post-procedure details: Patient was observed during the procedure. Post-procedure instructions were reviewed.  Patient left the clinic in  stable condition.

## 2022-09-13 NOTE — Progress Notes (Signed)
Functional Pain Scale - descriptive words and definitions  Distracting (5)    Aware of pain/able to complete some ADL's but limited by pain/sleep is affected and active distractions are only slightly useful. Moderate range order  Average Pain 8   +Driver, -BT, -Dye Allergies.  Lower back pain that goes all the way across with radiation into the left leg

## 2022-09-24 ENCOUNTER — Ambulatory Visit: Payer: Medicare Other | Admitting: Physical Medicine and Rehabilitation

## 2022-09-24 ENCOUNTER — Other Ambulatory Visit: Payer: Self-pay | Admitting: Physical Medicine and Rehabilitation

## 2022-09-24 ENCOUNTER — Encounter: Payer: Self-pay | Admitting: Physical Medicine and Rehabilitation

## 2022-09-24 ENCOUNTER — Telehealth: Payer: Self-pay | Admitting: Physical Medicine and Rehabilitation

## 2022-09-24 DIAGNOSIS — M545 Low back pain, unspecified: Secondary | ICD-10-CM | POA: Diagnosis not present

## 2022-09-24 DIAGNOSIS — M48062 Spinal stenosis, lumbar region with neurogenic claudication: Secondary | ICD-10-CM

## 2022-09-24 DIAGNOSIS — R269 Unspecified abnormalities of gait and mobility: Secondary | ICD-10-CM | POA: Diagnosis not present

## 2022-09-24 DIAGNOSIS — G8929 Other chronic pain: Secondary | ICD-10-CM

## 2022-09-24 DIAGNOSIS — M5416 Radiculopathy, lumbar region: Secondary | ICD-10-CM

## 2022-09-24 MED ORDER — TRAMADOL HCL 50 MG PO TABS: 50.0000 mg | ORAL_TABLET | Freq: Three times a day (TID) | ORAL | 0 refills | Status: DC | PRN

## 2022-09-24 NOTE — Progress Notes (Signed)
   09/24/22 1957  Fall Screening  Falls in the past year? 0  Number of falls in past year 0  Was there an injury with Fall? 0  Fall Risk Category Calculator 0  Fall Risk  Patient at Risk for Falls Due to Impaired mobility;Impaired balance/gait  Fall risk Follow up Falls evaluation completed;Education provided;Falls prevention discussed

## 2022-09-24 NOTE — Telephone Encounter (Signed)
Patient's daughter called. Would like Dr. Alvester Morin to call her. He is in pain. [phone]

## 2022-09-24 NOTE — Progress Notes (Unsigned)
Jonathan Owen - 89 y.o. male MRN [id number]  Date of birth: November 24, 1927  Office Visit Note: Visit Date: 09/24/2022 PCP: Pcp, No Referred by: No ref. provider found  Subjective: Chief Complaint  Patient presents with   Lower Back - Pain   HPI: Jonathan Owen is a 89 y.o. male who comes in today for evaluation of chronic, worsening and severe bilateral lower back pain.  Patient is poor historian, daughter accompanying him during our visit today. Pain ongoing for several years, worsens with standing. He describes pain as sore and aching, currently rates as 9 out of 10. Minimal relief of pain with rest and use of medications.  Patient's daughter requested urgent appointment today as his father's pain has increased significantly. Patient was evaluated in the emergency department today, however left before treatment was completed due to long wait. Lumbar x-rays from [facility] today exhibit multi level degenerative changes, most significant at the level of L3-L4. There is also remote appearing compression deformity at L1 with progressive loss of height of the vertebral body. Lumbar MRI imaging from 2022 exhibits multilevel facet hypertrophy, moderate spinal canal stenosis at L3-L4.  There is also 50% compression fracture of L1 vertebral body involving the superior endplate.  We have completed multiple lumbar injections in our office, including both lumbar facet joint injections and epidural steroid injections.  It is difficult to discern if these injections have helped patient significantly as he is a poor historian. Patient denies focal weakness, numbness and tingling. No recent trauma or falls. He ambulates with walker.     Review of Systems  Musculoskeletal:  Positive for back pain.  Neurological:  Negative for tingling, sensory change, focal weakness and weakness.  All other systems reviewed and are negative.  Otherwise per HPI.  Assessment & Plan: Visit Diagnoses:    ICD-10-CM   1.  Chronic bilateral low back pain without sciatica  M54.50 MR LUMBAR SPINE WO CONTRAST   G89.29     2. Spinal stenosis of lumbar region with neurogenic claudication  M48.062 MR LUMBAR SPINE WO CONTRAST    3. Gait abnormality  R26.9        Plan: Findings:  Chronic, worsening and severe bilateral lower back pain.  No radicular symptoms noted.  Patient continues to have severe pain despite good conservative therapy such as home exercise regimen, rest and use of medications.  Patient's clinical presentation and exam are complex.  Differentials would include neurogenic claudication as result of spinal canal stenosis.  I am concerned stenosis has worsened at the level of L3-L4 from prior imaging in 2022.  Given his symptoms have worsened significantly over the past few days I placed an order for new lumbar MRI imaging.  Depending on results of imaging we would consider performing lumbar epidural steroid injection.  I also discussed possibility of chronic pain management, would consider referral to [facility].  Patient also inquired about surgical consultation.  I am not sure he would be ideal surgical candidate given his advanced age and comorbidities, however we would be happy to place surgical referral so that he can discuss options.  We will have patient follow back up for lumbar MRI review and to discuss treatment options.  I discussed medication management in detail today and prescribe short course of Tramadol for him to take while he is waiting on MRI imaging. No red flag symptoms noted upon exam today.    Meds & Orders: No orders of the defined  types were placed in this encounter.   Orders Placed This Encounter  Procedures   MR LUMBAR SPINE WO CONTRAST    Follow-up: Return for follow-up for lumbar MRI review.   Procedures: No procedures performed      Clinical History: EXAM: MRI LUMBAR SPINE WITHOUT CONTRAST  TECHNIQUE: Multiplanar, multisequence MR imaging of  the lumbar spine was performed. No intravenous contrast was administered.  COMPARISON: Lumbar radiographs 07/13/2020. CT lumbar spine 04/04/2020  FINDINGS: Segmentation: Normal  Alignment: Mild kyphosis at the L1 level. Slight retrolisthesis L1-2, L2-3, L3-4.  Vertebrae: Approximately 50% compression fracture of L1 vertebral body involving the superior endplate. This fracture was present on the prior CT but has progressed significantly. No change from the recent radiographs. There is associated bone marrow edema and mild retropulsion of bone into the canal not causing significant spinal stenosis  No other fracture or mass lesion in the lumbar spine.  Conus medullaris and cauda equina: Conus extends to the L1-2 level. Conus and cauda equina appear normal.  Paraspinal and other soft tissues: Negative for paraspinous mass, adenopathy, or hematoma.  Disc levels:  T12-L1: Mild disc degeneration. Mild retropulsion L1 into the canal without significant stenosis  L1-2: Disc degeneration with asymmetric spurring on the right. Moderate subarticular and foraminal stenosis on the right. Spinal canal normal in size.  L2-3: Disc degeneration with disc space narrowing and endplate spurring. Mild subarticular stenosis bilaterally.  L3-4: Disc degeneration with disc space narrowing and diffuse endplate spurring. Bilateral facet hypertrophy. Moderate spinal stenosis. Moderate to severe subarticular and foraminal stenosis on the left. Moderate right subarticular stenosis.  L4-5: Moderate disc degeneration. Disc space narrowing and diffuse endplate spurring. Bilateral facet degeneration. Moderate subarticular stenosis bilaterally due to spurring  L5-S1: Disc degeneration with mild spurring. No significant stenosis.  IMPRESSION: 50% compression fracture L1 with bone marrow edema. Mild retropulsion of bone into the canal without significant stenosis. Fracture has progressed since the  prior CT of 04/04/2020. Mild associated kyphosis.  Multilevel degenerative change throughout the lumbar spine as described above.   Electronically Signed By: Marlan Palau M.D. On: 07/20/2020 11:52   He reports that he has never smoked. He has never used smokeless tobacco. No results for input(s): "HGBA1C", "LABURIC" in the last 8760 hours.  Objective:  VS:  HT:    WT:   BMI:     BP:   HR: bpm  TEMP: ( )  RESP:  Physical Exam Vitals and nursing note reviewed.  HENT:     Head: Normocephalic and atraumatic.     Right Ear: External ear normal.     Left Ear: External ear normal.     Nose: Nose normal.     Mouth/Throat:     Mouth: Mucous membranes are moist.  Eyes:     Extraocular Movements: Extraocular movements intact.  Cardiovascular:     Rate and Rhythm: Normal rate.     Pulses: Normal pulses.  Pulmonary:     Effort: Pulmonary effort is normal.  Abdominal:     General: Abdomen is flat. There is no distension.  Musculoskeletal:        General: Tenderness present.     Cervical back: Normal range of motion.     Comments: Patient is slow to rise from seated position to standing. Good lumbar range of motion. No pain noted with facet loading. 5/5 strength noted with bilateral hip flexion, knee flexion/extension, ankle dorsiflexion/plantarflexion and EHL. No clonus noted bilaterally. No pain upon palpation of greater trochanters. No  pain with internal/external rotation of bilateral hips. Sensation intact bilaterally. Negative slump test bilaterally. Ambulates with walker, gait unsteady.   Skin:    General: Skin is warm and dry.     Capillary Refill: Capillary refill takes less than 2 seconds.  Neurological:     Mental Status: He is alert and oriented to person, place, and time.     Gait: Gait abnormal.  Psychiatric:        Mood and Affect: Mood normal.        Behavior: Behavior normal.     Ortho Exam  Imaging: No results found.  Past Medical/Family/Surgical/Social  History: Medications & Allergies reviewed per EMR, new medications updated. Patient Active Problem List   Diagnosis Date Noted   Spinal stenosis of lumbar region with neurogenic claudication 09/13/2022   Age-related osteoporosis without current pathological fracture 09/19/2020   Low back pain 03/22/2020   Displaced intertrochanteric fracture of left femur, subsequent encounter for closed fracture with routine healing 07/21/2018   History of malignant neoplasm of colon 07/21/2018   HTN (hypertension) 07/15/2018   Type 2 diabetes mellitus without complication (HCC) 08/01/2015   Breast mass in male 11/13/2012   Past Medical History:  Diagnosis Date   Arthritis    Cancer (HCC)    hx colon cancer   Diabetes mellitus without complication (HCC)    no meds-watches diet   Hypertension    no meds now-   Wears dentures    upper-partial bottom   Wears glasses    History reviewed. No pertinent family history. Past Surgical History:  Procedure Laterality Date   CARPAL TUNNEL RELEASE  03/04/2012   Procedure: CARPAL TUNNEL RELEASE;  Surgeon: Nicki Reaper, MD;  Location: [facility];  Service: Orthopedics;  Laterality: Right;   CARPAL TUNNEL RELEASE Left 09/09/2012   Procedure: CARPAL TUNNEL RELEASE;  Surgeon: Nicki Reaper, MD;  Location: [facility];  Service: Orthopedics;  Laterality: Left;   COLONOSCOPY     COLONOSCOPY W/ BIOPSIES AND POLYPECTOMY     PARTIAL COLECTOMY  2006   sigmoid   STERNOTOMY  2006   medial-thymectomy   UPPER GASTROINTESTINAL ENDOSCOPY  2006   Social History   Occupational History   Not on file  Tobacco Use   Smoking status: Never   Smokeless tobacco: Never  Substance and Sexual Activity   Alcohol use: No   Drug use: No   Sexual activity: Not on file

## 2022-09-24 NOTE — Progress Notes (Unsigned)
Functional Pain Scale - descriptive words and definitions  Unmanageable (7)  Pain interferes with normal ADL's/nothing seems to help/sleep is very difficult/active distractions are very difficult to concentrate on. Severe range order  Average Pain 9   Lower back pain. Xrays in Care Everywhere from [facility]. Pain became very intense on 09/20/22

## 2022-09-24 NOTE — Progress Notes (Signed)
Spoke with patient's daughter this morning, patient experiencing excruciating pain. Minimal relief with recent lumbar ESI on 09/03/2022. I placed prescription for Tramadol and will also place order for chronic pain management referral.

## 2022-09-26 ENCOUNTER — Other Ambulatory Visit: Payer: Self-pay | Admitting: Physical Medicine and Rehabilitation

## 2022-09-26 ENCOUNTER — Encounter: Payer: Self-pay | Admitting: Physical Medicine and Rehabilitation

## 2022-09-26 ENCOUNTER — Telehealth: Payer: Self-pay | Admitting: Physical Medicine and Rehabilitation

## 2022-09-26 MED ORDER — BACLOFEN 10 MG PO TABS
5.0000 mg | ORAL_TABLET | Freq: Three times a day (TID) | ORAL | 0 refills | Status: DC
Start: 2022-09-26 — End: 2022-09-26

## 2022-09-26 MED ORDER — BACLOFEN 10 MG PO TABS
10.0000 mg | ORAL_TABLET | Freq: Three times a day (TID) | ORAL | 0 refills | Status: DC
Start: 2022-09-26 — End: 2022-11-05

## 2022-09-26 NOTE — Telephone Encounter (Signed)
Pt's daughter asked for medication to be sent to [facility] [address]. Pt phone number is [phone].

## 2022-09-26 NOTE — Telephone Encounter (Signed)
Medication sent to correct pharmacy  

## 2022-09-30 ENCOUNTER — Ambulatory Visit (HOSPITAL_BASED_OUTPATIENT_CLINIC_OR_DEPARTMENT_OTHER): Payer: Medicare Other

## 2022-10-02 ENCOUNTER — Telehealth: Payer: Self-pay

## 2022-10-02 NOTE — Telephone Encounter (Signed)
Tobi Bastos from [facility] called with PT plan. 1x a week for 1 week, 2 times a week for 2 weeks, and 1 time a week for 6 weeks for strength, balance, gait and home safety. Is this appropriate?

## 2022-10-03 NOTE — Telephone Encounter (Signed)
Spoke with Neysa Bonito at [facility] and informed her that his PT plan is fine

## 2022-10-07 ENCOUNTER — Ambulatory Visit (HOSPITAL_BASED_OUTPATIENT_CLINIC_OR_DEPARTMENT_OTHER)
Admission: RE | Admit: 2022-10-07 | Discharge: 2022-10-07 | Disposition: A | Discharge status: Home / Self Care | Payer: Medicare Other | Source: Ambulatory Visit | Attending: Physical Medicine and Rehabilitation | Admitting: Physical Medicine and Rehabilitation

## 2022-10-07 DIAGNOSIS — M545 Low back pain, unspecified: Secondary | ICD-10-CM | POA: Diagnosis present

## 2022-10-07 DIAGNOSIS — G8929 Other chronic pain: Secondary | ICD-10-CM | POA: Insufficient documentation

## 2022-10-07 DIAGNOSIS — M48062 Spinal stenosis, lumbar region with neurogenic claudication: Secondary | ICD-10-CM | POA: Diagnosis present

## 2022-10-08 ENCOUNTER — Encounter: Payer: Self-pay | Admitting: Physical Medicine & Rehabilitation

## 2022-10-09 ENCOUNTER — Other Ambulatory Visit: Payer: Medicare Other

## 2022-10-16 ENCOUNTER — Telehealth: Payer: Self-pay | Admitting: Physical Medicine and Rehabilitation

## 2022-10-16 DIAGNOSIS — Z794 Long term (current) use of insulin: Secondary | ICD-10-CM | POA: Diagnosis not present

## 2022-10-16 DIAGNOSIS — E119 Type 2 diabetes mellitus without complications: Secondary | ICD-10-CM | POA: Diagnosis not present

## 2022-10-16 DIAGNOSIS — Z85038 Personal history of other malignant neoplasm of large intestine: Secondary | ICD-10-CM | POA: Diagnosis not present

## 2022-10-16 DIAGNOSIS — M545 Low back pain, unspecified: Secondary | ICD-10-CM | POA: Diagnosis not present

## 2022-10-16 DIAGNOSIS — M1990 Unspecified osteoarthritis, unspecified site: Secondary | ICD-10-CM | POA: Diagnosis not present

## 2022-10-16 DIAGNOSIS — R269 Unspecified abnormalities of gait and mobility: Secondary | ICD-10-CM | POA: Diagnosis not present

## 2022-10-16 DIAGNOSIS — M48062 Spinal stenosis, lumbar region with neurogenic claudication: Secondary | ICD-10-CM | POA: Diagnosis not present

## 2022-10-16 DIAGNOSIS — M810 Age-related osteoporosis without current pathological fracture: Secondary | ICD-10-CM | POA: Diagnosis not present

## 2022-10-16 DIAGNOSIS — I10 Essential (primary) hypertension: Secondary | ICD-10-CM | POA: Diagnosis not present

## 2022-10-16 DIAGNOSIS — G8929 Other chronic pain: Secondary | ICD-10-CM | POA: Diagnosis not present

## 2022-10-16 NOTE — Telephone Encounter (Signed)
Patient's daughter called for Aundra Millet. Cb# [phone]

## 2022-10-16 NOTE — Telephone Encounter (Signed)
Spoke with patient's daughter Jonathan Owen this afternoon to discuss recent lumbar MRI imaging. I informed daughter there are minimal changes from prior imaging in 2022. Moderate central canal stenosis remains at L3-L4. Daughter states she recently arranged for her father to have medical grade massages, which seem to be very helped in alleviating his pain.   Of note, patient's daughter states she is very frustrated as she feels "no one put their hands on her father to really find out what was going on." She reports her father has knots on his back. I reassured daughter that I did perform lumbar spine exam during our visit and if I recall Dr. Alvester Morin has already addressed this in ongoing MyChart messages as well. My exam is documented in previous office visit note.  Patient can follow up with [place] as needed.  Of note, prior lumbar epidural steroid injections did not seem to provide significant relief of pain.

## 2022-10-17 DIAGNOSIS — E538 Deficiency of other specified B group vitamins: Secondary | ICD-10-CM | POA: Diagnosis not present

## 2022-10-22 DIAGNOSIS — R269 Unspecified abnormalities of gait and mobility: Secondary | ICD-10-CM | POA: Diagnosis not present

## 2022-10-22 DIAGNOSIS — M810 Age-related osteoporosis without current pathological fracture: Secondary | ICD-10-CM | POA: Diagnosis not present

## 2022-10-22 DIAGNOSIS — M48062 Spinal stenosis, lumbar region with neurogenic claudication: Secondary | ICD-10-CM | POA: Diagnosis not present

## 2022-10-22 DIAGNOSIS — G8929 Other chronic pain: Secondary | ICD-10-CM | POA: Diagnosis not present

## 2022-10-22 DIAGNOSIS — I10 Essential (primary) hypertension: Secondary | ICD-10-CM | POA: Diagnosis not present

## 2022-10-22 DIAGNOSIS — M545 Low back pain, unspecified: Secondary | ICD-10-CM | POA: Diagnosis not present

## 2022-10-22 DIAGNOSIS — Z85038 Personal history of other malignant neoplasm of large intestine: Secondary | ICD-10-CM | POA: Diagnosis not present

## 2022-10-22 DIAGNOSIS — Z794 Long term (current) use of insulin: Secondary | ICD-10-CM | POA: Diagnosis not present

## 2022-10-22 DIAGNOSIS — M1990 Unspecified osteoarthritis, unspecified site: Secondary | ICD-10-CM | POA: Diagnosis not present

## 2022-10-22 DIAGNOSIS — E119 Type 2 diabetes mellitus without complications: Secondary | ICD-10-CM | POA: Diagnosis not present

## 2022-10-24 DIAGNOSIS — M48062 Spinal stenosis, lumbar region with neurogenic claudication: Secondary | ICD-10-CM | POA: Diagnosis not present

## 2022-10-24 DIAGNOSIS — M47816 Spondylosis without myelopathy or radiculopathy, lumbar region: Secondary | ICD-10-CM | POA: Diagnosis not present

## 2022-10-25 DIAGNOSIS — E538 Deficiency of other specified B group vitamins: Secondary | ICD-10-CM | POA: Diagnosis not present

## 2022-10-30 DIAGNOSIS — E119 Type 2 diabetes mellitus without complications: Secondary | ICD-10-CM | POA: Diagnosis not present

## 2022-10-30 DIAGNOSIS — M810 Age-related osteoporosis without current pathological fracture: Secondary | ICD-10-CM | POA: Diagnosis not present

## 2022-10-30 DIAGNOSIS — R269 Unspecified abnormalities of gait and mobility: Secondary | ICD-10-CM | POA: Diagnosis not present

## 2022-10-30 DIAGNOSIS — M545 Low back pain, unspecified: Secondary | ICD-10-CM | POA: Diagnosis not present

## 2022-10-30 DIAGNOSIS — I10 Essential (primary) hypertension: Secondary | ICD-10-CM | POA: Diagnosis not present

## 2022-10-30 DIAGNOSIS — M48062 Spinal stenosis, lumbar region with neurogenic claudication: Secondary | ICD-10-CM | POA: Diagnosis not present

## 2022-10-30 DIAGNOSIS — Z85038 Personal history of other malignant neoplasm of large intestine: Secondary | ICD-10-CM | POA: Diagnosis not present

## 2022-10-30 DIAGNOSIS — Z794 Long term (current) use of insulin: Secondary | ICD-10-CM | POA: Diagnosis not present

## 2022-10-30 DIAGNOSIS — M1990 Unspecified osteoarthritis, unspecified site: Secondary | ICD-10-CM | POA: Diagnosis not present

## 2022-10-30 DIAGNOSIS — G8929 Other chronic pain: Secondary | ICD-10-CM | POA: Diagnosis not present

## 2022-10-31 DIAGNOSIS — E538 Deficiency of other specified B group vitamins: Secondary | ICD-10-CM | POA: Diagnosis not present

## 2022-11-02 DIAGNOSIS — Z794 Long term (current) use of insulin: Secondary | ICD-10-CM | POA: Diagnosis not present

## 2022-11-02 DIAGNOSIS — I10 Essential (primary) hypertension: Secondary | ICD-10-CM | POA: Diagnosis not present

## 2022-11-02 DIAGNOSIS — M48062 Spinal stenosis, lumbar region with neurogenic claudication: Secondary | ICD-10-CM | POA: Diagnosis not present

## 2022-11-02 DIAGNOSIS — Z85038 Personal history of other malignant neoplasm of large intestine: Secondary | ICD-10-CM | POA: Diagnosis not present

## 2022-11-02 DIAGNOSIS — M545 Low back pain, unspecified: Secondary | ICD-10-CM | POA: Diagnosis not present

## 2022-11-02 DIAGNOSIS — M810 Age-related osteoporosis without current pathological fracture: Secondary | ICD-10-CM | POA: Diagnosis not present

## 2022-11-02 DIAGNOSIS — G8929 Other chronic pain: Secondary | ICD-10-CM | POA: Diagnosis not present

## 2022-11-02 DIAGNOSIS — M1990 Unspecified osteoarthritis, unspecified site: Secondary | ICD-10-CM | POA: Diagnosis not present

## 2022-11-02 DIAGNOSIS — R269 Unspecified abnormalities of gait and mobility: Secondary | ICD-10-CM | POA: Diagnosis not present

## 2022-11-02 DIAGNOSIS — E119 Type 2 diabetes mellitus without complications: Secondary | ICD-10-CM | POA: Diagnosis not present

## 2022-11-05 ENCOUNTER — Other Ambulatory Visit: Payer: Self-pay | Admitting: Physical Medicine and Rehabilitation

## 2022-11-06 DIAGNOSIS — E119 Type 2 diabetes mellitus without complications: Secondary | ICD-10-CM | POA: Diagnosis not present

## 2022-11-06 DIAGNOSIS — M48062 Spinal stenosis, lumbar region with neurogenic claudication: Secondary | ICD-10-CM | POA: Diagnosis not present

## 2022-11-06 DIAGNOSIS — I10 Essential (primary) hypertension: Secondary | ICD-10-CM | POA: Diagnosis not present

## 2022-11-06 DIAGNOSIS — M545 Low back pain, unspecified: Secondary | ICD-10-CM | POA: Diagnosis not present

## 2022-11-06 DIAGNOSIS — M810 Age-related osteoporosis without current pathological fracture: Secondary | ICD-10-CM | POA: Diagnosis not present

## 2022-11-06 DIAGNOSIS — Z85038 Personal history of other malignant neoplasm of large intestine: Secondary | ICD-10-CM | POA: Diagnosis not present

## 2022-11-06 DIAGNOSIS — M1990 Unspecified osteoarthritis, unspecified site: Secondary | ICD-10-CM | POA: Diagnosis not present

## 2022-11-06 DIAGNOSIS — G8929 Other chronic pain: Secondary | ICD-10-CM | POA: Diagnosis not present

## 2022-11-06 DIAGNOSIS — Z794 Long term (current) use of insulin: Secondary | ICD-10-CM | POA: Diagnosis not present

## 2022-11-06 DIAGNOSIS — R269 Unspecified abnormalities of gait and mobility: Secondary | ICD-10-CM | POA: Diagnosis not present

## 2022-11-07 ENCOUNTER — Encounter: Payer: Medicare Other | Admitting: Physical Medicine & Rehabilitation

## 2022-11-09 DIAGNOSIS — E119 Type 2 diabetes mellitus without complications: Secondary | ICD-10-CM | POA: Diagnosis not present

## 2022-11-09 DIAGNOSIS — M810 Age-related osteoporosis without current pathological fracture: Secondary | ICD-10-CM | POA: Diagnosis not present

## 2022-11-09 DIAGNOSIS — R269 Unspecified abnormalities of gait and mobility: Secondary | ICD-10-CM | POA: Diagnosis not present

## 2022-11-09 DIAGNOSIS — Z794 Long term (current) use of insulin: Secondary | ICD-10-CM | POA: Diagnosis not present

## 2022-11-09 DIAGNOSIS — G8929 Other chronic pain: Secondary | ICD-10-CM | POA: Diagnosis not present

## 2022-11-09 DIAGNOSIS — M1990 Unspecified osteoarthritis, unspecified site: Secondary | ICD-10-CM | POA: Diagnosis not present

## 2022-11-09 DIAGNOSIS — M545 Low back pain, unspecified: Secondary | ICD-10-CM | POA: Diagnosis not present

## 2022-11-09 DIAGNOSIS — I10 Essential (primary) hypertension: Secondary | ICD-10-CM | POA: Diagnosis not present

## 2022-11-09 DIAGNOSIS — M48062 Spinal stenosis, lumbar region with neurogenic claudication: Secondary | ICD-10-CM | POA: Diagnosis not present

## 2022-11-09 DIAGNOSIS — Z85038 Personal history of other malignant neoplasm of large intestine: Secondary | ICD-10-CM | POA: Diagnosis not present

## 2022-11-13 DIAGNOSIS — E119 Type 2 diabetes mellitus without complications: Secondary | ICD-10-CM | POA: Diagnosis not present

## 2022-11-13 DIAGNOSIS — M810 Age-related osteoporosis without current pathological fracture: Secondary | ICD-10-CM | POA: Diagnosis not present

## 2022-11-13 DIAGNOSIS — I10 Essential (primary) hypertension: Secondary | ICD-10-CM | POA: Diagnosis not present

## 2022-11-13 DIAGNOSIS — G8929 Other chronic pain: Secondary | ICD-10-CM | POA: Diagnosis not present

## 2022-11-13 DIAGNOSIS — M48062 Spinal stenosis, lumbar region with neurogenic claudication: Secondary | ICD-10-CM | POA: Diagnosis not present

## 2022-11-13 DIAGNOSIS — Z794 Long term (current) use of insulin: Secondary | ICD-10-CM | POA: Diagnosis not present

## 2022-11-13 DIAGNOSIS — M545 Low back pain, unspecified: Secondary | ICD-10-CM | POA: Diagnosis not present

## 2022-11-13 DIAGNOSIS — Z85038 Personal history of other malignant neoplasm of large intestine: Secondary | ICD-10-CM | POA: Diagnosis not present

## 2022-11-13 DIAGNOSIS — R269 Unspecified abnormalities of gait and mobility: Secondary | ICD-10-CM | POA: Diagnosis not present

## 2022-11-13 DIAGNOSIS — M1990 Unspecified osteoarthritis, unspecified site: Secondary | ICD-10-CM | POA: Diagnosis not present

## 2022-11-14 DIAGNOSIS — E538 Deficiency of other specified B group vitamins: Secondary | ICD-10-CM | POA: Diagnosis not present

## 2022-11-16 DIAGNOSIS — M48062 Spinal stenosis, lumbar region with neurogenic claudication: Secondary | ICD-10-CM | POA: Diagnosis not present

## 2022-11-16 DIAGNOSIS — M810 Age-related osteoporosis without current pathological fracture: Secondary | ICD-10-CM | POA: Diagnosis not present

## 2022-11-16 DIAGNOSIS — Z85038 Personal history of other malignant neoplasm of large intestine: Secondary | ICD-10-CM | POA: Diagnosis not present

## 2022-11-16 DIAGNOSIS — Z794 Long term (current) use of insulin: Secondary | ICD-10-CM | POA: Diagnosis not present

## 2022-11-16 DIAGNOSIS — M1990 Unspecified osteoarthritis, unspecified site: Secondary | ICD-10-CM | POA: Diagnosis not present

## 2022-11-16 DIAGNOSIS — E119 Type 2 diabetes mellitus without complications: Secondary | ICD-10-CM | POA: Diagnosis not present

## 2022-11-16 DIAGNOSIS — M545 Low back pain, unspecified: Secondary | ICD-10-CM | POA: Diagnosis not present

## 2022-11-16 DIAGNOSIS — I10 Essential (primary) hypertension: Secondary | ICD-10-CM | POA: Diagnosis not present

## 2022-11-16 DIAGNOSIS — R269 Unspecified abnormalities of gait and mobility: Secondary | ICD-10-CM | POA: Diagnosis not present

## 2022-11-16 DIAGNOSIS — G8929 Other chronic pain: Secondary | ICD-10-CM | POA: Diagnosis not present

## 2022-11-20 DIAGNOSIS — R269 Unspecified abnormalities of gait and mobility: Secondary | ICD-10-CM | POA: Diagnosis not present

## 2022-11-20 DIAGNOSIS — M545 Low back pain, unspecified: Secondary | ICD-10-CM | POA: Diagnosis not present

## 2022-11-20 DIAGNOSIS — Z794 Long term (current) use of insulin: Secondary | ICD-10-CM | POA: Diagnosis not present

## 2022-11-20 DIAGNOSIS — I10 Essential (primary) hypertension: Secondary | ICD-10-CM | POA: Diagnosis not present

## 2022-11-20 DIAGNOSIS — G8929 Other chronic pain: Secondary | ICD-10-CM | POA: Diagnosis not present

## 2022-11-20 DIAGNOSIS — E119 Type 2 diabetes mellitus without complications: Secondary | ICD-10-CM | POA: Diagnosis not present

## 2022-11-20 DIAGNOSIS — M1990 Unspecified osteoarthritis, unspecified site: Secondary | ICD-10-CM | POA: Diagnosis not present

## 2022-11-20 DIAGNOSIS — M48062 Spinal stenosis, lumbar region with neurogenic claudication: Secondary | ICD-10-CM | POA: Diagnosis not present

## 2022-11-20 DIAGNOSIS — M810 Age-related osteoporosis without current pathological fracture: Secondary | ICD-10-CM | POA: Diagnosis not present

## 2022-11-20 DIAGNOSIS — Z85038 Personal history of other malignant neoplasm of large intestine: Secondary | ICD-10-CM | POA: Diagnosis not present

## 2022-11-23 DIAGNOSIS — M810 Age-related osteoporosis without current pathological fracture: Secondary | ICD-10-CM | POA: Diagnosis not present

## 2022-11-23 DIAGNOSIS — M545 Low back pain, unspecified: Secondary | ICD-10-CM | POA: Diagnosis not present

## 2022-11-23 DIAGNOSIS — Z85038 Personal history of other malignant neoplasm of large intestine: Secondary | ICD-10-CM | POA: Diagnosis not present

## 2022-11-23 DIAGNOSIS — M1990 Unspecified osteoarthritis, unspecified site: Secondary | ICD-10-CM | POA: Diagnosis not present

## 2022-11-23 DIAGNOSIS — Z794 Long term (current) use of insulin: Secondary | ICD-10-CM | POA: Diagnosis not present

## 2022-11-23 DIAGNOSIS — M48062 Spinal stenosis, lumbar region with neurogenic claudication: Secondary | ICD-10-CM | POA: Diagnosis not present

## 2022-11-23 DIAGNOSIS — G8929 Other chronic pain: Secondary | ICD-10-CM | POA: Diagnosis not present

## 2022-11-23 DIAGNOSIS — E119 Type 2 diabetes mellitus without complications: Secondary | ICD-10-CM | POA: Diagnosis not present

## 2022-11-23 DIAGNOSIS — I10 Essential (primary) hypertension: Secondary | ICD-10-CM | POA: Diagnosis not present

## 2022-11-23 DIAGNOSIS — R269 Unspecified abnormalities of gait and mobility: Secondary | ICD-10-CM | POA: Diagnosis not present

## 2022-11-26 DIAGNOSIS — M47816 Spondylosis without myelopathy or radiculopathy, lumbar region: Secondary | ICD-10-CM | POA: Diagnosis not present

## 2022-11-26 DIAGNOSIS — M48062 Spinal stenosis, lumbar region with neurogenic claudication: Secondary | ICD-10-CM | POA: Diagnosis not present

## 2022-11-27 DIAGNOSIS — R269 Unspecified abnormalities of gait and mobility: Secondary | ICD-10-CM | POA: Diagnosis not present

## 2022-11-27 DIAGNOSIS — Z85038 Personal history of other malignant neoplasm of large intestine: Secondary | ICD-10-CM | POA: Diagnosis not present

## 2022-11-27 DIAGNOSIS — M1990 Unspecified osteoarthritis, unspecified site: Secondary | ICD-10-CM | POA: Diagnosis not present

## 2022-11-27 DIAGNOSIS — E119 Type 2 diabetes mellitus without complications: Secondary | ICD-10-CM | POA: Diagnosis not present

## 2022-11-27 DIAGNOSIS — M545 Low back pain, unspecified: Secondary | ICD-10-CM | POA: Diagnosis not present

## 2022-11-27 DIAGNOSIS — M48062 Spinal stenosis, lumbar region with neurogenic claudication: Secondary | ICD-10-CM | POA: Diagnosis not present

## 2022-11-27 DIAGNOSIS — M810 Age-related osteoporosis without current pathological fracture: Secondary | ICD-10-CM | POA: Diagnosis not present

## 2022-11-27 DIAGNOSIS — I10 Essential (primary) hypertension: Secondary | ICD-10-CM | POA: Diagnosis not present

## 2022-11-27 DIAGNOSIS — Z794 Long term (current) use of insulin: Secondary | ICD-10-CM | POA: Diagnosis not present

## 2022-11-27 DIAGNOSIS — G8929 Other chronic pain: Secondary | ICD-10-CM | POA: Diagnosis not present

## 2022-11-30 DIAGNOSIS — M1990 Unspecified osteoarthritis, unspecified site: Secondary | ICD-10-CM | POA: Diagnosis not present

## 2022-11-30 DIAGNOSIS — Z794 Long term (current) use of insulin: Secondary | ICD-10-CM | POA: Diagnosis not present

## 2022-11-30 DIAGNOSIS — G8929 Other chronic pain: Secondary | ICD-10-CM | POA: Diagnosis not present

## 2022-11-30 DIAGNOSIS — I10 Essential (primary) hypertension: Secondary | ICD-10-CM | POA: Diagnosis not present

## 2022-11-30 DIAGNOSIS — Z85038 Personal history of other malignant neoplasm of large intestine: Secondary | ICD-10-CM | POA: Diagnosis not present

## 2022-11-30 DIAGNOSIS — R269 Unspecified abnormalities of gait and mobility: Secondary | ICD-10-CM | POA: Diagnosis not present

## 2022-11-30 DIAGNOSIS — E119 Type 2 diabetes mellitus without complications: Secondary | ICD-10-CM | POA: Diagnosis not present

## 2022-11-30 DIAGNOSIS — M48062 Spinal stenosis, lumbar region with neurogenic claudication: Secondary | ICD-10-CM | POA: Diagnosis not present

## 2022-11-30 DIAGNOSIS — M810 Age-related osteoporosis without current pathological fracture: Secondary | ICD-10-CM | POA: Diagnosis not present

## 2022-11-30 DIAGNOSIS — M545 Low back pain, unspecified: Secondary | ICD-10-CM | POA: Diagnosis not present

## 2022-12-03 DIAGNOSIS — R269 Unspecified abnormalities of gait and mobility: Secondary | ICD-10-CM | POA: Diagnosis not present

## 2022-12-03 DIAGNOSIS — M545 Low back pain, unspecified: Secondary | ICD-10-CM | POA: Diagnosis not present

## 2022-12-03 DIAGNOSIS — Z85038 Personal history of other malignant neoplasm of large intestine: Secondary | ICD-10-CM | POA: Diagnosis not present

## 2022-12-03 DIAGNOSIS — E119 Type 2 diabetes mellitus without complications: Secondary | ICD-10-CM | POA: Diagnosis not present

## 2022-12-03 DIAGNOSIS — M48062 Spinal stenosis, lumbar region with neurogenic claudication: Secondary | ICD-10-CM | POA: Diagnosis not present

## 2022-12-03 DIAGNOSIS — I10 Essential (primary) hypertension: Secondary | ICD-10-CM | POA: Diagnosis not present

## 2022-12-03 DIAGNOSIS — M810 Age-related osteoporosis without current pathological fracture: Secondary | ICD-10-CM | POA: Diagnosis not present

## 2022-12-03 DIAGNOSIS — G8929 Other chronic pain: Secondary | ICD-10-CM | POA: Diagnosis not present

## 2022-12-03 DIAGNOSIS — M1990 Unspecified osteoarthritis, unspecified site: Secondary | ICD-10-CM | POA: Diagnosis not present

## 2022-12-03 DIAGNOSIS — Z794 Long term (current) use of insulin: Secondary | ICD-10-CM | POA: Diagnosis not present

## 2022-12-06 DIAGNOSIS — Z794 Long term (current) use of insulin: Secondary | ICD-10-CM | POA: Diagnosis not present

## 2022-12-06 DIAGNOSIS — G8929 Other chronic pain: Secondary | ICD-10-CM | POA: Diagnosis not present

## 2022-12-06 DIAGNOSIS — M545 Low back pain, unspecified: Secondary | ICD-10-CM | POA: Diagnosis not present

## 2022-12-06 DIAGNOSIS — M48062 Spinal stenosis, lumbar region with neurogenic claudication: Secondary | ICD-10-CM | POA: Diagnosis not present

## 2022-12-06 DIAGNOSIS — I10 Essential (primary) hypertension: Secondary | ICD-10-CM | POA: Diagnosis not present

## 2022-12-06 DIAGNOSIS — R269 Unspecified abnormalities of gait and mobility: Secondary | ICD-10-CM | POA: Diagnosis not present

## 2022-12-06 DIAGNOSIS — M810 Age-related osteoporosis without current pathological fracture: Secondary | ICD-10-CM | POA: Diagnosis not present

## 2022-12-06 DIAGNOSIS — Z85038 Personal history of other malignant neoplasm of large intestine: Secondary | ICD-10-CM | POA: Diagnosis not present

## 2022-12-06 DIAGNOSIS — M1990 Unspecified osteoarthritis, unspecified site: Secondary | ICD-10-CM | POA: Diagnosis not present

## 2022-12-06 DIAGNOSIS — E119 Type 2 diabetes mellitus without complications: Secondary | ICD-10-CM | POA: Diagnosis not present

## 2022-12-11 DIAGNOSIS — Z794 Long term (current) use of insulin: Secondary | ICD-10-CM | POA: Diagnosis not present

## 2022-12-11 DIAGNOSIS — M810 Age-related osteoporosis without current pathological fracture: Secondary | ICD-10-CM | POA: Diagnosis not present

## 2022-12-11 DIAGNOSIS — E119 Type 2 diabetes mellitus without complications: Secondary | ICD-10-CM | POA: Diagnosis not present

## 2022-12-13 DIAGNOSIS — E538 Deficiency of other specified B group vitamins: Secondary | ICD-10-CM | POA: Diagnosis not present

## 2022-12-27 DIAGNOSIS — Z9989 Dependence on other enabling machines and devices: Secondary | ICD-10-CM | POA: Diagnosis not present

## 2023-01-23 DIAGNOSIS — M1990 Unspecified osteoarthritis, unspecified site: Secondary | ICD-10-CM | POA: Diagnosis not present

## 2023-01-23 DIAGNOSIS — Z23 Encounter for immunization: Secondary | ICD-10-CM | POA: Diagnosis not present

## 2023-01-23 DIAGNOSIS — Z9989 Dependence on other enabling machines and devices: Secondary | ICD-10-CM | POA: Diagnosis not present

## 2023-01-23 DIAGNOSIS — D510 Vitamin B12 deficiency anemia due to intrinsic factor deficiency: Secondary | ICD-10-CM | POA: Diagnosis not present

## 2023-02-28 DIAGNOSIS — M48062 Spinal stenosis, lumbar region with neurogenic claudication: Secondary | ICD-10-CM | POA: Diagnosis not present

## 2023-05-25 ENCOUNTER — Inpatient Hospital Stay (HOSPITAL_COMMUNITY)
Admission: EM | Admit: 2023-05-25 | Discharge: 2023-06-03 | DRG: 872 | Disposition: A | Discharge status: Skilled Nursing Facility | Payer: Medicare Other | Attending: Internal Medicine | Admitting: Internal Medicine

## 2023-05-25 ENCOUNTER — Other Ambulatory Visit: Payer: Self-pay

## 2023-05-25 ENCOUNTER — Emergency Department (HOSPITAL_COMMUNITY): Payer: Medicare Other

## 2023-05-25 ENCOUNTER — Encounter (HOSPITAL_COMMUNITY): Payer: Self-pay | Admitting: Emergency Medicine

## 2023-05-25 DIAGNOSIS — N5089 Other specified disorders of the male genital organs: Secondary | ICD-10-CM | POA: Diagnosis present

## 2023-05-25 DIAGNOSIS — Z1152 Encounter for screening for COVID-19: Secondary | ICD-10-CM

## 2023-05-25 DIAGNOSIS — E1165 Type 2 diabetes mellitus with hyperglycemia: Secondary | ICD-10-CM | POA: Diagnosis present

## 2023-05-25 DIAGNOSIS — B9620 Unspecified Escherichia coli [E. coli] as the cause of diseases classified elsewhere: Secondary | ICD-10-CM | POA: Diagnosis present

## 2023-05-25 DIAGNOSIS — Z86711 Personal history of pulmonary embolism: Secondary | ICD-10-CM

## 2023-05-25 DIAGNOSIS — Z85038 Personal history of other malignant neoplasm of large intestine: Secondary | ICD-10-CM

## 2023-05-25 DIAGNOSIS — Z794 Long term (current) use of insulin: Secondary | ICD-10-CM

## 2023-05-25 DIAGNOSIS — E119 Type 2 diabetes mellitus without complications: Secondary | ICD-10-CM

## 2023-05-25 DIAGNOSIS — F05 Delirium due to known physiological condition: Secondary | ICD-10-CM | POA: Diagnosis not present

## 2023-05-25 DIAGNOSIS — I4819 Other persistent atrial fibrillation: Secondary | ICD-10-CM | POA: Diagnosis present

## 2023-05-25 DIAGNOSIS — Z7901 Long term (current) use of anticoagulants: Secondary | ICD-10-CM

## 2023-05-25 DIAGNOSIS — R6520 Severe sepsis without septic shock: Secondary | ICD-10-CM | POA: Diagnosis present

## 2023-05-25 DIAGNOSIS — A419 Sepsis, unspecified organism: Principal | ICD-10-CM | POA: Diagnosis present

## 2023-05-25 DIAGNOSIS — M810 Age-related osteoporosis without current pathological fracture: Secondary | ICD-10-CM | POA: Diagnosis present

## 2023-05-25 DIAGNOSIS — E871 Hypo-osmolality and hyponatremia: Secondary | ICD-10-CM | POA: Diagnosis present

## 2023-05-25 DIAGNOSIS — L89151 Pressure ulcer of sacral region, stage 1: Secondary | ICD-10-CM | POA: Diagnosis present

## 2023-05-25 DIAGNOSIS — Z7982 Long term (current) use of aspirin: Secondary | ICD-10-CM

## 2023-05-25 DIAGNOSIS — Z7984 Long term (current) use of oral hypoglycemic drugs: Secondary | ICD-10-CM

## 2023-05-25 DIAGNOSIS — I700 Atherosclerosis of aorta: Secondary | ICD-10-CM | POA: Diagnosis present

## 2023-05-25 DIAGNOSIS — K828 Other specified diseases of gallbladder: Secondary | ICD-10-CM | POA: Diagnosis present

## 2023-05-25 DIAGNOSIS — I480 Paroxysmal atrial fibrillation: Secondary | ICD-10-CM

## 2023-05-25 DIAGNOSIS — Z79899 Other long term (current) drug therapy: Secondary | ICD-10-CM

## 2023-05-25 DIAGNOSIS — I10 Essential (primary) hypertension: Secondary | ICD-10-CM | POA: Diagnosis present

## 2023-05-25 DIAGNOSIS — I4891 Unspecified atrial fibrillation: Secondary | ICD-10-CM | POA: Diagnosis present

## 2023-05-25 DIAGNOSIS — Z9049 Acquired absence of other specified parts of digestive tract: Secondary | ICD-10-CM

## 2023-05-25 DIAGNOSIS — E872 Acidosis, unspecified: Secondary | ICD-10-CM | POA: Diagnosis present

## 2023-05-25 DIAGNOSIS — K810 Acute cholecystitis: Secondary | ICD-10-CM | POA: Diagnosis present

## 2023-05-25 DIAGNOSIS — R300 Dysuria: Secondary | ICD-10-CM | POA: Diagnosis not present

## 2023-05-25 DIAGNOSIS — Z1612 Extended spectrum beta lactamase (ESBL) resistance: Secondary | ICD-10-CM | POA: Diagnosis present

## 2023-05-25 LAB — CBC WITH DIFFERENTIAL/PLATELET
Abs Immature Granulocytes: 0.14 10*3/uL — ABNORMAL HIGH (ref 0.00–0.07)
Basophils Absolute: 0 10*3/uL (ref 0.0–0.1)
Basophils Relative: 0 %
Eosinophils Absolute: 0 10*3/uL (ref 0.0–0.5)
Eosinophils Relative: 0 %
HCT: 37.1 % — ABNORMAL LOW (ref 39.0–52.0)
Hemoglobin: 12.3 g/dL — ABNORMAL LOW (ref 13.0–17.0)
Immature Granulocytes: 1 %
Lymphocytes Relative: 4 %
Lymphs Abs: 0.6 10*3/uL — ABNORMAL LOW (ref 0.7–4.0)
MCH: 29.9 pg (ref 26.0–34.0)
MCHC: 33.2 g/dL (ref 30.0–36.0)
MCV: 90.3 fL (ref 80.0–100.0)
Monocytes Absolute: 1.2 10*3/uL — ABNORMAL HIGH (ref 0.1–1.0)
Monocytes Relative: 7 %
Neutro Abs: 14.4 10*3/uL — ABNORMAL HIGH (ref 1.7–7.7)
Neutrophils Relative %: 88 %
Platelets: 269 10*3/uL (ref 150–400)
RBC: 4.11 MIL/uL — ABNORMAL LOW (ref 4.22–5.81)
RDW: 13 % (ref 11.5–15.5)
WBC: 16.3 10*3/uL — ABNORMAL HIGH (ref 4.0–10.5)
nRBC: 0 % (ref 0.0–0.2)

## 2023-05-25 LAB — I-STAT CG4 LACTIC ACID, ED
Lactic Acid, Venous: 2.2 mmol/L (ref 0.5–1.9)
Lactic Acid, Venous: 2 mmol/L (ref 0.5–1.9)

## 2023-05-25 LAB — COMPREHENSIVE METABOLIC PANEL
ALT: 12 U/L (ref 0–44)
AST: 17 U/L (ref 15–41)
Albumin: 3.2 g/dL — ABNORMAL LOW (ref 3.5–5.0)
Alkaline Phosphatase: 58 U/L (ref 38–126)
Anion gap: 11 (ref 5–15)
BUN: 14 mg/dL (ref 8–23)
CO2: 20 mmol/L — ABNORMAL LOW (ref 22–32)
Calcium: 9 mg/dL (ref 8.9–10.3)
Chloride: 95 mmol/L — ABNORMAL LOW (ref 98–111)
Creatinine, Ser: 0.81 mg/dL (ref 0.61–1.24)
GFR, Estimated: >60 mL/min (ref >60)
Glucose, Bld: 266 mg/dL — ABNORMAL HIGH (ref 70–99)
Potassium: 4.5 mmol/L (ref 3.5–5.1)
Sodium: 126 mmol/L — ABNORMAL LOW (ref 135–145)
Total Bilirubin: 2.1 mg/dL — ABNORMAL HIGH (ref 0.0–1.2)
Total Protein: 6.8 g/dL (ref 6.5–8.1)

## 2023-05-25 LAB — PROTIME-INR
INR: 1.2 (ref 0.8–1.2)
Prothrombin Time: 15.2 s (ref 11.4–15.2)

## 2023-05-25 LAB — RESP PANEL BY RT-PCR (RSV, FLU A&B, COVID)  RVPGX2
Influenza A by PCR: NEGATIVE
Influenza B by PCR: NEGATIVE
Resp Syncytial Virus by PCR: NEGATIVE
SARS Coronavirus 2 by RT PCR: NEGATIVE

## 2023-05-25 LAB — APTT: aPTT: 25 s (ref 24–36)

## 2023-05-25 LAB — CBG MONITORING, ED: Glucose-Capillary: 253 mg/dL — ABNORMAL HIGH (ref 70–99)

## 2023-05-25 MED ORDER — ACETAMINOPHEN 325 MG PO TABS
650.0000 mg | ORAL_TABLET | Freq: Once | ORAL | Status: AC
  Administered 2023-05-25: 650 mg via ORAL
  Filled 2023-05-25: qty 2

## 2023-05-25 MED ORDER — METRONIDAZOLE 500 MG/100ML IV SOLN
500.0000 mg | Freq: Once | INTRAVENOUS | Status: AC
  Administered 2023-05-25: 500 mg via INTRAVENOUS
  Filled 2023-05-25: qty 100

## 2023-05-25 MED ORDER — LACTATED RINGERS IV BOLUS (SEPSIS)
1000.0000 mL | Freq: Once | INTRAVENOUS | Status: AC
  Administered 2023-05-25: 1000 mL via INTRAVENOUS

## 2023-05-25 MED ORDER — LACTATED RINGERS IV SOLN: INTRAVENOUS | Status: DC

## 2023-05-25 MED ORDER — VANCOMYCIN HCL IN DEXTROSE 1-5 GM/200ML-% IV SOLN
1000.0000 mg | Freq: Once | INTRAVENOUS | Status: AC
  Administered 2023-05-25: 1000 mg via INTRAVENOUS
  Filled 2023-05-25: qty 200

## 2023-05-25 MED ORDER — SODIUM CHLORIDE 0.9 % IV SOLN
2.0000 g | Freq: Once | INTRAVENOUS | Status: AC
  Administered 2023-05-25: 2 g via INTRAVENOUS
  Filled 2023-05-25: qty 12.5

## 2023-05-25 MED ORDER — LORAZEPAM 2 MG/ML IJ SOLN
0.5000 mg | Freq: Once | INTRAMUSCULAR | Status: AC
  Administered 2023-05-25: 0.5 mg via INTRAVENOUS
  Filled 2023-05-25: qty 1

## 2023-05-25 NOTE — ED Provider Notes (Signed)
 Care assumed at 2300.  Patient here with fever, shortness of breath.  Concern for sepsis and antibiotics have been started.  Care assumed pending CMP, reevaluation.  On assessment patient is tachycardic, mildly tachypneic with heart rate in the 120s.  CMP with mild hyponatremia, hyperglycemia and normal renal function.  Hospitalist consulted for admission for ongoing care.  Just after calling hospitalist for admission patient became very tachycardic with heart rate in the 190s.  EKG with A-fib with RVR.  He was treated with diltiazem  bolus and started on drip.  He quickly had an improvement in his heart rate to the 150s, but persistent A-fib with RVR.  He also had significant increase in his work of breathing and respiratory rate.  Repeat chest x-ray was obtained.  Hospitalist updated of rapid change in status.   CRITICAL CARE Performed by: Almarie Burner   Total critical care time: 35 minutes  Critical care time was exclusive of separately billable procedures and treating other patients.  Critical care was necessary to treat or prevent imminent or life-threatening deterioration.  Critical care was time spent personally by me on the following activities: development of treatment plan with patient and/or surrogate as well as nursing, discussions with consultants, evaluation of patient's response to treatment, examination of patient, obtaining history from patient or surrogate, ordering and performing treatments and interventions, ordering and review of laboratory studies, ordering and review of radiographic studies, pulse oximetry and re-evaluation of patient's condition.    Burner Almarie, MD 05/26/23 [phone]

## 2023-05-25 NOTE — ED Notes (Signed)
 Jonathan Owen, son in law. [phone]

## 2023-05-25 NOTE — ED Notes (Addendum)
 NT(WC) in mini lab reported the abnormal Lactic result to dr. Monnie Anthony.

## 2023-05-25 NOTE — ED Provider Notes (Signed)
 [place] EMERGENCY DEPARTMENT AT [facility] Provider Note   CSN: [id number] Arrival date & time: 05/25/23  2024     History  Chief Complaint  Patient presents with   Shortness of Breath   Weakness    Jonathan Owen is a 89 y.o. male.  The history is provided by the patient and medical records. No language interpreter was used.  Shortness of Breath Severity:  Moderate Onset quality:  Gradual Duration:  4 days Timing:  Constant Progression:  Waxing and waning Chronicity:  New Context: URI   Relieved by:  Nothing Worsened by:  Coughing Ineffective treatments:  None tried Associated symptoms: cough and fever   Associated symptoms: no abdominal pain, no chest pain, no headaches, no neck pain, no rash, no sputum production, no syncope, no vomiting and no wheezing   Weakness Associated symptoms: cough, fever and shortness of breath   Associated symptoms: no abdominal pain, no chest pain, no diarrhea, no dysuria, no headaches, no nausea, no syncope and no vomiting         Home Medications Prior to Admission medications   Medication Sig Start Date End Date Taking? Authorizing Provider  aspirin 81 MG tablet Take 81 mg by mouth daily.    [provider]  baclofen  (LIORESAL ) 10 MG tablet TAKE 1 TABLET BY MOUTH THREE TIMES A DAY 11/05/22   Williams, Megan E, NP  Insulin  Aspart FlexPen (NOVOLOG ) 100 UNIT/ML USE AS DIRECTED. MAX DAILY DOSE IS 50 UNITS. 08/01/20   [provider]  LORazepam  (ATIVAN ) 1 MG tablet Take 0.5 mg by mouth at bedtime as needed.     [provider]  tamsulosin  (FLOMAX ) 0.4 MG CAPS capsule Take 0.4 mg by mouth daily.    [provider]  TOUJEO  SOLOSTAR 300 UNIT/ML Solostar Pen Inject into the skin.    [provider]  traMADol  (ULTRAM ) 50 MG tablet Take 1 tablet (50 mg total) by mouth every 8 (eight) hours as needed for moderate pain or severe pain. 09/24/22 09/24/23  Williams, Megan E, NP  zolpidem  (AMBIEN) 5 MG tablet Take 5 mg by mouth at bedtime as needed. 08/16/22   [provider]      Allergies    Tape    Review of Systems   Review of Systems  Constitutional:  Positive for chills, fatigue and fever.  HENT:  Positive for congestion.   Respiratory:  Positive for cough and shortness of breath. Negative for sputum production, chest tightness and wheezing.   Cardiovascular:  Negative for chest pain, palpitations and syncope.  Gastrointestinal:  Negative for abdominal pain, constipation, diarrhea, nausea and vomiting.  Genitourinary:  Negative for dysuria and flank pain.  Musculoskeletal:  Negative for back pain, neck pain and neck stiffness.  Skin:  Negative for rash and wound.  Neurological:  Positive for weakness. Negative for light-headedness and headaches.  Psychiatric/Behavioral:  Negative for agitation and confusion.   All other systems reviewed and are negative.   Physical Exam Updated Vital Signs BP (!) 153/64 (BP Location: Right Arm)   Pulse (!) 127   Temp (!) 101 F (38.3 C) (Oral)   Resp (!) 30   SpO2 98%  Physical Exam Vitals and nursing note reviewed.  Constitutional:      General: He is not in acute distress.    Appearance: He is well-developed. He is not ill-appearing, toxic-appearing or diaphoretic.  HENT:     Head: Normocephalic and atraumatic.  Eyes:  Conjunctiva/sclera: Conjunctivae normal.     Pupils: Pupils are equal, round, and reactive to light.  Cardiovascular:     Rate and Rhythm: Regular rhythm. Tachycardia present.     Heart sounds: No murmur heard. Pulmonary:     Effort: Pulmonary effort is normal. Tachypnea present. No respiratory distress.     Breath sounds: Rhonchi present.  Chest:     Chest wall: No tenderness.  Abdominal:     Palpations: Abdomen is soft.     Tenderness: There is no abdominal tenderness.  Musculoskeletal:        General: No swelling.     Cervical back: Neck supple.     Right lower leg: No edema.      Left lower leg: No edema.  Skin:    General: Skin is warm and dry.     Capillary Refill: Capillary refill takes less than 2 seconds.     Findings: No erythema.  Neurological:     Mental Status: He is alert.  Psychiatric:        Mood and Affect: Mood normal.     ED Results / Procedures / Treatments   Labs (all labs ordered are listed, but only abnormal results are displayed) Labs Reviewed  CBC WITH DIFFERENTIAL/PLATELET - Abnormal; Notable for the following components:      Result Value   WBC 16.3 (*)    RBC 4.11 (*)    Hemoglobin 12.3 (*)    HCT 37.1 (*)    Neutro Abs 14.4 (*)    Lymphs Abs 0.6 (*)    Monocytes Absolute 1.2 (*)    Abs Immature Granulocytes 0.14 (*)    All other components within normal limits  COMPREHENSIVE METABOLIC PANEL - Abnormal; Notable for the following components:   Sodium 126 (*)    Chloride 95 (*)    CO2 20 (*)    Glucose, Bld 266 (*)    Albumin 3.2 (*)    Total Bilirubin 2.1 (*)    All other components within normal limits  I-STAT CG4 LACTIC ACID, ED - Abnormal; Notable for the following components:   Lactic Acid, Venous 2.2 (*)    All other components within normal limits  CBG MONITORING, ED - Abnormal; Notable for the following components:   Glucose-Capillary 253 (*)    All other components within normal limits  I-STAT CG4 LACTIC ACID, ED - Abnormal; Notable for the following components:   Lactic Acid, Venous 2.0 (*)    All other components within normal limits  CULTURE, BLOOD (ROUTINE X 2)  CULTURE, BLOOD (ROUTINE X 2)  RESP PANEL BY RT-PCR (RSV, FLU A&B, COVID)  RVPGX2  PROTIME-INR  APTT  URINALYSIS, W/ REFLEX TO CULTURE (INFECTION SUSPECTED)    EKG EKG Interpretation Date/Time:  Saturday May 25 2023 20:35:18 EST Ventricular Rate:  129 PR Interval:  153 QRS Duration:  90 QT Interval:  290 QTC Calculation: 425 R Axis:   -6  Text Interpretation: Sinus tachycardia when compared to prior, faster rate No STEMI Confirmed by  Ginger Barefoot ([id number]) on 05/25/2023 8:50:36 PM  Radiology DG Chest Portable 1 View Result Date: 05/25/2023 CLINICAL DATA:  Cough, sepsis EXAM: PORTABLE CHEST 1 VIEW COMPARISON:  07/18/2021 FINDINGS: Single frontal view of the chest demonstrates stable postsurgical changes from median sternotomy. The cardiac silhouette is enlarged. Lung volumes are diminished, with crowding of the central vasculature. No airspace disease, effusion, or pneumothorax. IMPRESSION: 1. Low lung volumes.  No acute airspace disease. Electronically Signed   By:  Ozell Daring M.D.   On: 05/25/2023 21:52    Procedures Procedures    CRITICAL CARE Performed by: Lonni PARAS Selicia Windom Total critical care time: 35 minutes Critical care time was exclusive of separately billable procedures and treating other patients. Critical care was necessary to treat or prevent imminent or life-threatening deterioration. Critical care was time spent personally by me on the following activities: development of treatment plan with patient and/or surrogate as well as nursing, discussions with consultants, evaluation of patient's response to treatment, examination of patient, obtaining history from patient or surrogate, ordering and performing treatments and interventions, ordering and review of laboratory studies, ordering and review of radiographic studies, pulse oximetry and re-evaluation of patient's condition.  Medications Ordered in ED Medications  lactated ringers  infusion (has no administration in time range)  lactated ringers  bolus 1,000 mL (has no administration in time range)    And  lactated ringers  bolus 1,000 mL (has no administration in time range)    And  lactated ringers  bolus 1,000 mL (has no administration in time range)  ceFEPIme  (MAXIPIME ) 2 g in sodium chloride  0.9 % 100 mL IVPB (has no administration in time range)  metroNIDAZOLE  (FLAGYL ) IVPB 500 mg (has no administration in time range)  vancomycin  (VANCOCIN ) IVPB  1000 mg/200 mL premix (has no administration in time range)  acetaminophen  (TYLENOL ) tablet 650 mg (has no administration in time range)    ED Course/ Medical Decision Making/ A&P                                 Medical Decision Making Amount and/or Complexity of Data Reviewed Labs: ordered. Radiology: ordered.  Risk OTC drugs. Prescription drug management.    DERONTE SOLIS is a 89 y.o. male with a past medical history significant for hypertension, diabetes, previous colon cancer, and osteoporosis who presents for shortness of breath, cough, and fatigue.  According to EMS, patient has had symptoms for about a week with URI symptoms but worsened over the last few days.  Unclear if there is sick contacts.  Patient is denying any chest pain but does have shortness of breath and a cough that is not productive.  Denies hemoptysis.  EMS report patient febrile tachycardic and tachypneic.  He is denying any chest pain or palpitations.  Denies nausea, vomiting, constipation, diarrhea, or urinary changes.  Denies any trauma.  Denies any abdominal pain back or flank pain.  Not reporting headache or neck pain.  On exam, lungs have coarseness bilaterally.  Chest is nontender and back is nontender.  Abdomen nontender.  Good pulses in extremities.  Legs are nontender and nonedematous.  Patient is tachycardic and tachypneic and febrile.  EKG does not show STEMI.  Clinically concerned about sepsis.  This very well could be viral given the amount of influenza in the community however with his age and vital signs we will treat as a code sepsis with broad-spectrum antibiotics.  Will get x-ray and labs.  Will check for COVID/flu/RSV.  Patient will need admission after workup is completed due to his sepsis.  Anticipate admission after workup is completed.  Care transferred to oncoming team to wait for labs to return and for admission for the patient.        Final Clinical Impression(s) / ED  Diagnoses Final diagnoses:  Sepsis, due to unspecified organism, unspecified whether acute organ dysfunction present [facility])    Clinical Impression: 1. Sepsis, due to  unspecified organism, unspecified whether acute organ dysfunction present [facility])     Disposition: Admit  This note was prepared with assistance of Dragon voice recognition software. Occasional wrong-word or sound-a-like substitutions may have occurred due to the inherent limitations of voice recognition software.     Marthella Osorno, Lonni PARAS, MD 05/25/23 [phone]

## 2023-05-25 NOTE — ED Triage Notes (Signed)
 Pt BIBA c/o shob that started last night, has been sick with flu/cold symptoms last few weeks. 97% RA RR 30 temp 101 HR 131. Alert and oriented to person and place only.  EMS 18g R wrist 500cc ns bolus

## 2023-05-26 ENCOUNTER — Inpatient Hospital Stay (HOSPITAL_COMMUNITY): Payer: Medicare Other

## 2023-05-26 DIAGNOSIS — E1165 Type 2 diabetes mellitus with hyperglycemia: Secondary | ICD-10-CM | POA: Diagnosis present

## 2023-05-26 DIAGNOSIS — F05 Delirium due to known physiological condition: Secondary | ICD-10-CM | POA: Diagnosis not present

## 2023-05-26 DIAGNOSIS — Z1612 Extended spectrum beta lactamase (ESBL) resistance: Secondary | ICD-10-CM | POA: Diagnosis present

## 2023-05-26 DIAGNOSIS — E871 Hypo-osmolality and hyponatremia: Secondary | ICD-10-CM | POA: Diagnosis present

## 2023-05-26 DIAGNOSIS — E872 Acidosis, unspecified: Secondary | ICD-10-CM | POA: Diagnosis present

## 2023-05-26 DIAGNOSIS — Z794 Long term (current) use of insulin: Secondary | ICD-10-CM | POA: Diagnosis not present

## 2023-05-26 DIAGNOSIS — Z9049 Acquired absence of other specified parts of digestive tract: Secondary | ICD-10-CM | POA: Diagnosis not present

## 2023-05-26 DIAGNOSIS — R6520 Severe sepsis without septic shock: Secondary | ICD-10-CM | POA: Diagnosis present

## 2023-05-26 DIAGNOSIS — I10 Essential (primary) hypertension: Secondary | ICD-10-CM | POA: Diagnosis present

## 2023-05-26 DIAGNOSIS — I480 Paroxysmal atrial fibrillation: Secondary | ICD-10-CM | POA: Diagnosis not present

## 2023-05-26 DIAGNOSIS — Z7901 Long term (current) use of anticoagulants: Secondary | ICD-10-CM | POA: Diagnosis not present

## 2023-05-26 DIAGNOSIS — I4891 Unspecified atrial fibrillation: Secondary | ICD-10-CM | POA: Diagnosis not present

## 2023-05-26 DIAGNOSIS — M810 Age-related osteoporosis without current pathological fracture: Secondary | ICD-10-CM | POA: Diagnosis present

## 2023-05-26 DIAGNOSIS — K810 Acute cholecystitis: Secondary | ICD-10-CM | POA: Diagnosis present

## 2023-05-26 DIAGNOSIS — Z515 Encounter for palliative care: Secondary | ICD-10-CM | POA: Diagnosis not present

## 2023-05-26 DIAGNOSIS — B9620 Unspecified Escherichia coli [E. coli] as the cause of diseases classified elsewhere: Secondary | ICD-10-CM | POA: Diagnosis present

## 2023-05-26 DIAGNOSIS — L89151 Pressure ulcer of sacral region, stage 1: Secondary | ICD-10-CM | POA: Diagnosis present

## 2023-05-26 DIAGNOSIS — I482 Chronic atrial fibrillation, unspecified: Secondary | ICD-10-CM | POA: Insufficient documentation

## 2023-05-26 DIAGNOSIS — Z1152 Encounter for screening for COVID-19: Secondary | ICD-10-CM | POA: Diagnosis not present

## 2023-05-26 DIAGNOSIS — Z86711 Personal history of pulmonary embolism: Secondary | ICD-10-CM | POA: Diagnosis not present

## 2023-05-26 DIAGNOSIS — E119 Type 2 diabetes mellitus without complications: Secondary | ICD-10-CM

## 2023-05-26 DIAGNOSIS — I4819 Other persistent atrial fibrillation: Secondary | ICD-10-CM | POA: Diagnosis present

## 2023-05-26 DIAGNOSIS — A419 Sepsis, unspecified organism: Secondary | ICD-10-CM

## 2023-05-26 DIAGNOSIS — Z7982 Long term (current) use of aspirin: Secondary | ICD-10-CM | POA: Diagnosis not present

## 2023-05-26 DIAGNOSIS — Z7189 Other specified counseling: Secondary | ICD-10-CM | POA: Diagnosis not present

## 2023-05-26 DIAGNOSIS — I700 Atherosclerosis of aorta: Secondary | ICD-10-CM | POA: Diagnosis present

## 2023-05-26 DIAGNOSIS — Z85038 Personal history of other malignant neoplasm of large intestine: Secondary | ICD-10-CM | POA: Diagnosis not present

## 2023-05-26 DIAGNOSIS — K828 Other specified diseases of gallbladder: Secondary | ICD-10-CM | POA: Diagnosis present

## 2023-05-26 DIAGNOSIS — Z7984 Long term (current) use of oral hypoglycemic drugs: Secondary | ICD-10-CM | POA: Diagnosis not present

## 2023-05-26 DIAGNOSIS — Z79899 Other long term (current) drug therapy: Secondary | ICD-10-CM | POA: Diagnosis not present

## 2023-05-26 HISTORY — DX: Hypo-osmolality and hyponatremia: E87.1

## 2023-05-26 HISTORY — DX: Chronic atrial fibrillation, unspecified: I48.20

## 2023-05-26 HISTORY — DX: Sepsis, unspecified organism: A41.9

## 2023-05-26 LAB — ECHOCARDIOGRAM COMPLETE
AR max vel: 1.85 cm2
AV Area VTI: 2.09 cm2
AV Area mean vel: 1.73 cm2
AV Mean grad: 11 mm[Hg]
AV Peak grad: 14.7 mm[Hg]
Ao pk vel: 1.92 m/s
Area-P 1/2: 3.27 cm2
Height: 66.5 in
S' Lateral: 3.6 cm
Weight: 3287.5 [oz_av]

## 2023-05-26 LAB — COMPREHENSIVE METABOLIC PANEL
ALT: 12 U/L (ref 0–44)
AST: 17 U/L (ref 15–41)
Albumin: 2.6 g/dL — ABNORMAL LOW (ref 3.5–5.0)
Alkaline Phosphatase: 54 U/L (ref 38–126)
Anion gap: 8 (ref 5–15)
BUN: 11 mg/dL (ref 8–23)
CO2: 22 mmol/L (ref 22–32)
Calcium: 8.5 mg/dL — ABNORMAL LOW (ref 8.9–10.3)
Chloride: 97 mmol/L — ABNORMAL LOW (ref 98–111)
Creatinine, Ser: 0.74 mg/dL (ref 0.61–1.24)
GFR, Estimated: >60 mL/min (ref >60)
Glucose, Bld: 324 mg/dL — ABNORMAL HIGH (ref 70–99)
Potassium: 4 mmol/L (ref 3.5–5.1)
Sodium: 127 mmol/L — ABNORMAL LOW (ref 135–145)
Total Bilirubin: 1.8 mg/dL — ABNORMAL HIGH (ref 0.0–1.2)
Total Protein: 5.8 g/dL — ABNORMAL LOW (ref 6.5–8.1)

## 2023-05-26 LAB — CBC
HCT: 35.2 % — ABNORMAL LOW (ref 39.0–52.0)
Hemoglobin: 11.8 g/dL — ABNORMAL LOW (ref 13.0–17.0)
MCH: 30.3 pg (ref 26.0–34.0)
MCHC: 33.5 g/dL (ref 30.0–36.0)
MCV: 90.5 fL (ref 80.0–100.0)
Platelets: 259 10*3/uL (ref 150–400)
RBC: 3.89 MIL/uL — ABNORMAL LOW (ref 4.22–5.81)
RDW: 12.8 % (ref 11.5–15.5)
WBC: 18 10*3/uL — ABNORMAL HIGH (ref 4.0–10.5)
nRBC: 0 % (ref 0.0–0.2)

## 2023-05-26 LAB — GLUCOSE, CAPILLARY
Glucose-Capillary: 175 mg/dL — ABNORMAL HIGH (ref 70–99)
Glucose-Capillary: 138 mg/dL — ABNORMAL HIGH (ref 70–99)

## 2023-05-26 LAB — URINALYSIS, W/ REFLEX TO CULTURE (INFECTION SUSPECTED)
Bacteria, UA: NONE SEEN
Bilirubin Urine: NEGATIVE
Glucose, UA: 150 mg/dL — AB
Hgb urine dipstick: NEGATIVE
Ketones, ur: 5 mg/dL — AB
Leukocytes,Ua: NEGATIVE
Nitrite: NEGATIVE
Protein, ur: NEGATIVE mg/dL
Specific Gravity, Urine: 1.016 (ref 1.005–1.030)
pH: 6 (ref 5.0–8.0)

## 2023-05-26 LAB — HEPARIN LEVEL (UNFRACTIONATED): Heparin Unfractionated: <0.1 [IU]/mL — ABNORMAL LOW (ref 0.30–0.70)

## 2023-05-26 LAB — PROTIME-INR
INR: 1.2 (ref 0.8–1.2)
Prothrombin Time: 15.7 s — ABNORMAL HIGH (ref 11.4–15.2)

## 2023-05-26 LAB — CBG MONITORING, ED
Glucose-Capillary: 221 mg/dL — ABNORMAL HIGH (ref 70–99)
Glucose-Capillary: 185 mg/dL — ABNORMAL HIGH (ref 70–99)

## 2023-05-26 LAB — HEMOGLOBIN A1C
Hgb A1c MFr Bld: 6.1 % — ABNORMAL HIGH (ref 4.8–5.6)
Mean Plasma Glucose: 128.37 mg/dL

## 2023-05-26 LAB — CORTISOL-AM, BLOOD: Cortisol - AM: 26.3 ug/dL — ABNORMAL HIGH (ref 6.7–22.6)

## 2023-05-26 MED ORDER — IOHEXOL 300 MG/ML  SOLN
30.0000 mL | Freq: Once | INTRAMUSCULAR | Status: AC | PRN
  Administered 2023-05-26: 30 mL via ORAL

## 2023-05-26 MED ORDER — ENOXAPARIN SODIUM 40 MG/0.4ML IJ SOSY: 40.0000 mg | PREFILLED_SYRINGE | INTRAMUSCULAR | Status: DC

## 2023-05-26 MED ORDER — INSULIN ASPART 100 UNIT/ML IJ SOLN
0.0000 [IU] | Freq: Every day | INTRAMUSCULAR | Status: DC
  Administered 2023-05-29: 2 [IU] via SUBCUTANEOUS
  Filled 2023-05-26: qty 0.05

## 2023-05-26 MED ORDER — VANCOMYCIN HCL 1750 MG/350ML IV SOLN
1750.0000 mg | INTRAVENOUS | Status: DC
  Administered 2023-05-27: 1750 mg via INTRAVENOUS
  Filled 2023-05-26: qty 350

## 2023-05-26 MED ORDER — METRONIDAZOLE 500 MG/100ML IV SOLN: 500.0000 mg | Freq: Two times a day (BID) | INTRAVENOUS | Status: DC

## 2023-05-26 MED ORDER — DILTIAZEM HCL-DEXTROSE 125-5 MG/125ML-% IV SOLN (PREMIX): 5.0000 mg/h | INTRAVENOUS | Status: DC

## 2023-05-26 MED ORDER — QUETIAPINE FUMARATE 25 MG PO TABS: 25.0000 mg | ORAL_TABLET | Freq: Every day | ORAL | Status: DC

## 2023-05-26 MED ORDER — DILTIAZEM HCL-DEXTROSE 5-125 %-MG/125ML IV SOLN: 5.0000 mg/h | INTRAVENOUS | Status: DC

## 2023-05-26 MED ORDER — CLONAZEPAM 0.5 MG PO TABS
0.5000 mg | ORAL_TABLET | Freq: Two times a day (BID) | ORAL | Status: DC | PRN
Start: 2023-05-26 — End: 2023-06-03
  Administered 2023-05-26 – 2023-06-01 (×3): 0.5 mg via ORAL
  Filled 2023-05-26 (×4): qty 1

## 2023-05-26 MED ORDER — LEVALBUTEROL HCL 0.63 MG/3ML IN NEBU
0.6300 mg | INHALATION_SOLUTION | Freq: Once | RESPIRATORY_TRACT | Status: AC | PRN
  Administered 2023-05-26: 0.63 mg via RESPIRATORY_TRACT
  Filled 2023-05-26: qty 3

## 2023-05-26 MED ORDER — SODIUM CHLORIDE 0.9 % IV SOLN: 2.0000 g | Freq: Once | INTRAVENOUS | Status: DC

## 2023-05-26 MED ORDER — DILTIAZEM HCL-DEXTROSE 125-5 MG/125ML-% IV SOLN (PREMIX)
5.0000 mg/h | INTRAVENOUS | Status: DC
  Administered 2023-05-26: 15 mg/h via INTRAVENOUS
  Filled 2023-05-26: qty 125

## 2023-05-26 MED ORDER — ACETAMINOPHEN 325 MG PO TABS
650.0000 mg | ORAL_TABLET | Freq: Four times a day (QID) | ORAL | Status: DC | PRN
  Administered 2023-05-26 – 2023-06-03 (×5): 650 mg via ORAL
  Filled 2023-05-26 (×5): qty 2

## 2023-05-26 MED ORDER — DILTIAZEM LOAD VIA INFUSION
10.0000 mg | Freq: Once | INTRAVENOUS | Status: DC
  Filled 2023-05-26: qty 10

## 2023-05-26 MED ORDER — DILTIAZEM LOAD VIA INFUSION
10.0000 mg | Freq: Once | INTRAVENOUS | Status: AC
  Administered 2023-05-26: 10 mg via INTRAVENOUS
  Filled 2023-05-26: qty 10

## 2023-05-26 MED ORDER — DILTIAZEM HCL-DEXTROSE 125-5 MG/125ML-% IV SOLN (PREMIX)
INTRAVENOUS | Status: AC
  Filled 2023-05-26: qty 125

## 2023-05-26 MED ORDER — LORAZEPAM 0.5 MG PO TABS
0.5000 mg | ORAL_TABLET | Freq: Every evening | ORAL | Status: DC | PRN
  Administered 2023-05-26: 0.5 mg via ORAL
  Filled 2023-05-26: qty 1

## 2023-05-26 MED ORDER — ONDANSETRON HCL 4 MG PO TABS: 4.0000 mg | ORAL_TABLET | Freq: Four times a day (QID) | ORAL | Status: DC | PRN

## 2023-05-26 MED ORDER — HALOPERIDOL LACTATE 5 MG/ML IJ SOLN
2.0000 mg | Freq: Four times a day (QID) | INTRAMUSCULAR | Status: DC | PRN
  Administered 2023-05-26 – 2023-05-27 (×3): 2 mg via INTRAVENOUS
  Filled 2023-05-26 (×3): qty 1

## 2023-05-26 MED ORDER — INSULIN GLARGINE-YFGN 100 UNIT/ML ~~LOC~~ SOLN: 15.0000 [IU] | Freq: Every day | SUBCUTANEOUS | Status: DC

## 2023-05-26 MED ORDER — DILTIAZEM HCL-DEXTROSE 125-5 MG/125ML-% IV SOLN (PREMIX)
5.0000 mg/h | INTRAVENOUS | Status: DC
  Filled 2023-05-26: qty 125

## 2023-05-26 MED ORDER — DILTIAZEM HCL-DEXTROSE 125-5 MG/125ML-% IV SOLN (PREMIX)
5.0000 mg/h | INTRAVENOUS | Status: DC
  Administered 2023-05-26: 5 mg/h via INTRAVENOUS

## 2023-05-26 MED ORDER — METRONIDAZOLE 500 MG/100ML IV SOLN
500.0000 mg | Freq: Two times a day (BID) | INTRAVENOUS | Status: DC
Start: 2023-05-26 — End: 2023-06-02
  Administered 2023-05-26 – 2023-05-27 (×4): 500 mg via INTRAVENOUS
  Filled 2023-05-26 (×4): qty 100

## 2023-05-26 MED ORDER — ACETAMINOPHEN 650 MG RE SUPP: 650.0000 mg | Freq: Four times a day (QID) | RECTAL | Status: DC | PRN

## 2023-05-26 MED ORDER — DILTIAZEM HCL 25 MG/5ML IV SOLN
INTRAVENOUS | Status: AC
  Filled 2023-05-26: qty 5

## 2023-05-26 MED ORDER — ENOXAPARIN SODIUM 40 MG/0.4ML IJ SOSY
40.0000 mg | PREFILLED_SYRINGE | Freq: Every day | INTRAMUSCULAR | Status: DC
  Administered 2023-05-26: 40 mg via SUBCUTANEOUS
  Filled 2023-05-26: qty 0.4

## 2023-05-26 MED ORDER — IOHEXOL 300 MG/ML  SOLN
100.0000 mL | Freq: Once | INTRAMUSCULAR | Status: AC | PRN
  Administered 2023-05-26: 100 mL via INTRAVENOUS

## 2023-05-26 MED ORDER — LACTATED RINGERS IV SOLN: INTRAVENOUS | Status: DC

## 2023-05-26 MED ORDER — MELATONIN 5 MG PO TABS
5.0000 mg | ORAL_TABLET | Freq: Every day | ORAL | Status: DC
  Administered 2023-05-27 – 2023-06-02 (×4): 5 mg via ORAL
  Filled 2023-05-26 (×5): qty 1

## 2023-05-26 MED ORDER — ONDANSETRON HCL 4 MG/2ML IJ SOLN
4.0000 mg | Freq: Four times a day (QID) | INTRAMUSCULAR | Status: DC | PRN
  Administered 2023-05-30 – 2023-06-02 (×3): 4 mg via INTRAVENOUS
  Filled 2023-05-26 (×3): qty 2

## 2023-05-26 MED ORDER — INSULIN GLARGINE-YFGN 100 UNIT/ML ~~LOC~~ SOLN
20.0000 [IU] | Freq: Every day | SUBCUTANEOUS | Status: DC
Start: 2023-05-26 — End: 2023-05-26
  Filled 2023-05-26: qty 0.2

## 2023-05-26 MED ORDER — SODIUM CHLORIDE 0.9 % IV SOLN
2.0000 g | Freq: Three times a day (TID) | INTRAVENOUS | Status: DC
  Administered 2023-05-26 – 2023-05-28 (×7): 2 g via INTRAVENOUS
  Filled 2023-05-26 (×7): qty 12.5

## 2023-05-26 MED ORDER — HEPARIN BOLUS VIA INFUSION
3000.0000 [IU] | Freq: Once | INTRAVENOUS | Status: AC
Start: 2023-05-26 — End: 2023-05-26
  Administered 2023-05-26: 3000 [IU] via INTRAVENOUS
  Filled 2023-05-26: qty 3000

## 2023-05-26 MED ORDER — INSULIN ASPART 100 UNIT/ML IJ SOLN
0.0000 [IU] | Freq: Three times a day (TID) | INTRAMUSCULAR | Status: DC
  Administered 2023-05-26: 3 [IU] via SUBCUTANEOUS
  Administered 2023-05-26: 5 [IU] via SUBCUTANEOUS
  Administered 2023-05-26: 3 [IU] via SUBCUTANEOUS
  Administered 2023-05-27 (×3): 2 [IU] via SUBCUTANEOUS
  Administered 2023-05-28: 3 [IU] via SUBCUTANEOUS
  Administered 2023-05-28: 2 [IU] via SUBCUTANEOUS
  Administered 2023-05-28 – 2023-05-30 (×5): 3 [IU] via SUBCUTANEOUS
  Administered 2023-05-30: 8 [IU] via SUBCUTANEOUS
  Administered 2023-05-30 – 2023-05-31 (×2): 5 [IU] via SUBCUTANEOUS
  Administered 2023-05-31 (×2): 3 [IU] via SUBCUTANEOUS
  Administered 2023-06-01: 2 [IU] via SUBCUTANEOUS
  Administered 2023-06-01 – 2023-06-03 (×6): 3 [IU] via SUBCUTANEOUS
  Filled 2023-05-26: qty 0.15

## 2023-05-26 MED ORDER — TAMSULOSIN HCL 0.4 MG PO CAPS
0.4000 mg | ORAL_CAPSULE | Freq: Every day | ORAL | Status: DC
  Administered 2023-05-26 – 2023-06-03 (×8): 0.4 mg via ORAL
  Filled 2023-05-26 (×8): qty 1

## 2023-05-26 MED ORDER — VANCOMYCIN HCL IN DEXTROSE 1-5 GM/200ML-% IV SOLN
1000.0000 mg | Freq: Once | INTRAVENOUS | Status: DC
  Filled 2023-05-26: qty 200

## 2023-05-26 MED ORDER — LACTATED RINGERS IV SOLN: 150.0000 mL/h | INTRAVENOUS | Status: DC

## 2023-05-26 MED ORDER — HEPARIN (PORCINE) 25000 UT/250ML-% IV SOLN
1300.0000 [IU]/h | INTRAVENOUS | Status: DC
  Administered 2023-05-26: 1300 [IU]/h via INTRAVENOUS
  Filled 2023-05-26: qty 250

## 2023-05-26 NOTE — ED Provider Notes (Signed)
 I was called to the bedside by ED RN Dena due to concern for acute decompensation with patient.  I was not part of his initial ED care team, patient was cared for by Dr. Imelda, please see his associated note for further insight and the patient did course.  In brief patient was presented to the emergency department for URI symptoms, being admitted for sepsis.  Per ED RN he became acutely confused and his heart rate spiked, EKG handed to this provider with A-fib with RVR, per chart review no history of same.  When I presented to the bedside for evaluation, patient's heart rate was 215 on the monitor and he was tremulous.  Per brief chart review, no history of same, diltiazem  10 mg bolus ordered (10 mg rather than 20 due to low normal pressures), infusion ordered by this provider. Dr. Griselda presented to the bedside, repeat chest x-ray ordered for concern of possible acute pulmonary edema given fluid resuscitation with sepsis protocol.  Dr. Sim admitting provider Also presented to the bedside at this time, patient placed on defibrillator pads and ZOLL machine for monitoring due to acute decompensation, heart rate improving after diltiazem  bolus, patient remains short of breath and tremulous.  EDP and admitting team aware  This chart was dictated using voice recognition software, Dragon. Despite the best efforts of this provider to proofread and correct errors, errors may still occur which can change documentation meaning.    Bobette Pleasant JONELLE DEVONNA 05/26/23 0052    Griselda Norris, MD 05/26/23 [phone]

## 2023-05-26 NOTE — Progress Notes (Signed)
 Pharmacy Antibiotic Note  Jonathan Owen is a 89 y.o. male admitted on 05/25/2023 with a past medical history significant for hypertension, diabetes, previous colon cancer, and osteoporosis who presents for shortness of breath, cough, and fatigue. SABRA  Pharmacy has been consulted to dose vancomycin  and cefepime  for sepsis.  Loading doses given in the ED  Plan: Vancomycin1750mg  IV q36h (AUC 523.3, Scr 0.81) Cefepime  2gm IV q8h Follow renal function, cultures and clinical course  Height: 5' 6.5 (168.9 cm) Weight: 93.2 kg (205 lb 7.5 oz) IBW/kg (Calculated) : 64.95  Temp (24hrs), Avg:101 F (38.3 C), Min:101 F (38.3 C), Max:101 F (38.3 C)  Recent Labs  Lab 05/25/23 2042 05/25/23 2054 05/25/23 2130 05/25/23 2233  WBC 16.3*  --   --   --   CREATININE  --   --  0.81  --   LATICACIDVEN  --  2.2*  --  2.0*    Estimated Creatinine Clearance: 61.5 mL/min (by C-G formula based on SCr of 0.81 mg/dL).    Allergies  Allergen Reactions   Tape Other (See Comments)    Unknown    Antimicrobials this admission: 2/8 cefepime  >> 2/8 vanc >> 2/8 flagyl  x 1  Dose adjustments this admission:   Microbiology results: 2/8 BCx  Thank you for allowing pharmacy to be a part of this patient's care.  Leeroy Mace RPh 05/26/2023, 12:49 AM

## 2023-05-26 NOTE — Progress Notes (Addendum)
 PHARMACY - ANTICOAGULATION CONSULT NOTE  Pharmacy Consult for heparin  Indication: atrial fibrillation  Allergies  Allergen Reactions   Tape Other (See Comments)    Unknown    Patient Measurements: Height: 5' 6.5 (168.9 cm) Weight: 93.2 kg (205 lb 7.5 oz) IBW/kg (Calculated) : 64.95 Heparin  Dosing Weight: 93kg  Vital Signs: Temp: 98.5 F (36.9 C) (02/09 0052) Temp Source: Oral (02/09 0052) BP: 104/80 (02/08 2345) Pulse Rate: 121 (02/08 2345)  Labs: Recent Labs    05/25/23 2042 05/25/23 2055 05/25/23 2130  HGB 12.3*  --   --   HCT 37.1*  --   --   PLT 269  --   --   APTT  --  25  --   LABPROT  --  15.2  --   INR  --  1.2  --   CREATININE  --   --  0.81    Estimated Creatinine Clearance: 61.5 mL/min (by C-G formula based on SCr of 0.81 mg/dL).   Medical History: Past Medical History:  Diagnosis Date   Arthritis    Cancer (HCC)    hx colon cancer   Diabetes mellitus without complication (HCC)    no meds-watches diet   Hypertension    no meds now-   Wears dentures    upper-partial bottom   Wears glasses      Assessment: 89 yo male with A-fib with RVR , pharmacy to dose heparin  drip  Pt has taken apixaban  in the past, says he doesn't take anymore, no recent fill hx (daughter confirmed pt is no longer taking apixaban )  aPTT 25, INR 1.2, Hgb 12.3, plts 269, Wcr 0.81  Goal of Therapy:  Heparin  level 0.3-0.7 units/ml Monitor platelets by anticoagulation protocol: Yes   Plan:  Heparin  bolus 3000 units x 1 Start heparin  drip 1300 units/hr Heparin  level in 8 hours Daily CBC   Leeroy Mace RPh 05/26/2023, 1:01 AM

## 2023-05-26 NOTE — ED Notes (Signed)
 Verbal order to discontinue Diltiazem  infusion. Starting to tapering of drug.

## 2023-05-26 NOTE — ED Notes (Signed)
 Breakfast tray delivered

## 2023-05-26 NOTE — Progress Notes (Addendum)
 PROGRESS NOTE  Jonathan Owen  FMW:[id number] DOB: 02-20-1928 DOA: 05/25/2023 PCP: Dottie Norleen PHEBE PONCE, MD   Brief Narrative: Patient is a 58 male with history of insulin -dependent diabetes type 2, hypertension, colon cancer, osteoarthritis, prior PE who presented with shortness of breath, fever, chills, weakness from home. He felt like being smothered. On presentation, he was tachycardiac, febrile.  Suspected to have sepsis of unclear etiology.  UA was not suspicoius  UTI.  Chest x-ray did not show any pneumonia.  Also briefly went to A-fib with RVR in the emergency department.  Currently in normal sinus rhythm  Assessment & Plan:  Principal Problem:   Sepsis (HCC) Active Problems:   HTN (hypertension)   Type 2 diabetes mellitus without complication (HCC)   Hyponatremia   Atrial fibrillation with RVR (HCC)   Sepsis: Presented with fever, leukocytosis, tachycardia.  Feels like being smothered.  Etiology unclear.  Continue broad spectrum antibiotics.  Follow-up cultures.  Currently blood pressure stable.  Mildly elevated lactate level.  Has leukocytosis Chest x-ray did not show a pneumonia.  Taking CT chest without contrast.  No signs of ulcers or wounds are on the body.  No abdominal pain.  Continue IV fluid  New onset A-fib with RVR: Started on Cardizem  drip.  This is most likely triggered from sepsis.  During my evaluation, he was in his normal sinus rhythm.  EKG showed sinus tachycardia.  Anticoagulation, Cardizem  drip discontinued.  Continue to monitor on telemetry  Insulin -dependent diabetes type 2: Recent A1c of 6.1.  Takes insulin  at home.  Continue sliding scale, long-acting insulin .  Monitor blood sugars  Hyponatremia: this is most likely secondary to hyperosmolar hyponatremia from hyperglycemia.  Continue monitoring  Hypertension: Currently BP stable.  Weakness: Patient lives alone.  Ambulatory at baseline.  Will consult PT  Addendum: CT chest showed possible acute  cholecystitis, RUQ USG equivocal.  Patient continues to be febrile.  Continue to antibiotics, IV fluids.  Will keep him n.p.o..  Getting CT abdomen/pelvis with contrast and also HIDA scan.  Called and discussed with her daughter Olam again this evening. Goals of care briefly discussed.  Patient remains full code.       DVT prophylaxis:Lovenox      Code Status: Full Code  Family Communication: called and discussed with daughter on phone on 2/9  Patient status: Inpatient  Patient is from : Home  Anticipated discharge to: Home  Estimated DC date: 2 to 3 days   Consultants: None  Procedures: None  Antimicrobials:  Anti-infectives (From admission, onward)    Start     Dose/Rate Route Frequency Ordered Stop   05/26/23 2200  vancomycin  (VANCOREADY) IVPB 1750 mg/350 mL        1,750 mg 175 mL/hr over 120 Minutes Intravenous Every 24 hours 05/26/23 0050     05/26/23 1000  metroNIDAZOLE  (FLAGYL ) IVPB 500 mg        500 mg 100 mL/hr over 60 Minutes Intravenous Every 12 hours 05/26/23 0415 06/02/23 0959   05/26/23 0415  ceFEPIme  (MAXIPIME ) 2 g in sodium chloride  0.9 % 100 mL IVPB  Status:  Discontinued        2 g 200 mL/hr over 30 Minutes Intravenous  Once 05/26/23 0411 05/26/23 0414   05/26/23 0415  metroNIDAZOLE  (FLAGYL ) IVPB 500 mg  Status:  Discontinued        500 mg 100 mL/hr over 60 Minutes Intravenous Every 12 hours 05/26/23 0411 05/26/23 0415   05/26/23 0415  vancomycin  (VANCOCIN ) IVPB 1000  mg/200 mL premix  Status:  Discontinued        1,000 mg 200 mL/hr over 60 Minutes Intravenous  Once 05/26/23 0411 05/26/23 0415   05/26/23 0400  ceFEPIme  (MAXIPIME ) 2 g in sodium chloride  0.9 % 100 mL IVPB        2 g 200 mL/hr over 30 Minutes Intravenous Every 8 hours 05/26/23 0050     05/25/23 2100  ceFEPIme  (MAXIPIME ) 2 g in sodium chloride  0.9 % 100 mL IVPB        2 g 200 mL/hr over 30 Minutes Intravenous  Once 05/25/23 2049 05/25/23 2137   05/25/23 2100  metroNIDAZOLE  (FLAGYL ) IVPB  500 mg        500 mg 100 mL/hr over 60 Minutes Intravenous  Once 05/25/23 2049 05/25/23 2312   05/25/23 2100  vancomycin  (VANCOCIN ) IVPB 1000 mg/200 mL premix        1,000 mg 200 mL/hr over 60 Minutes Intravenous  Once 05/25/23 2049 05/25/23 2207       Subjective: Patient seen and examined at bedside today.  Hemodynamically stable during my evaluation.  Heart rate already well-controlled and he was in normal sinus rhythm.  On room air.  Denies any shortness of breath or cough, abdomen pain, nausea or vomiting.  Objective: Vitals:   05/26/23 0630 05/26/23 0645 05/26/23 0730 05/26/23 0745  BP: (!) 106/91 139/74 128/63 (!) 147/66  Pulse: 90 (!) 109 (!) 105 (!) 101  Resp: (!) 25 (!) 40 (!) 33 (!) 25  Temp:      TempSrc:      SpO2: 96% 96% 95% 97%  Weight:      Height:        Intake/Output Summary (Last 24 hours) at 05/26/2023 9193 Last data filed at 05/26/2023 0211 Gross per 24 hour  Intake 16.36 ml  Output --  Net 16.36 ml   Filed Weights   05/26/23 0000  Weight: 93.2 kg    Examination:  General exam: Overall comfortable, not in distress, pleasant elderly male HEENT: PERRL Respiratory system:  no wheezes or crackles  Cardiovascular system: Normal sinus rhythm, mild sinus tachycardia Gastrointestinal system: Abdomen is nondistended, soft and nontender. Central nervous system: Alert and oriented Extremities: No edema, no clubbing ,no cyanosis Skin: No rashes, no ulcers,no icterus     Data Reviewed: I have personally reviewed following labs and imaging studies  CBC: Recent Labs  Lab 05/25/23 2042 05/26/23 0500  WBC 16.3* 18.0*  NEUTROABS 14.4*  --   HGB 12.3* 11.8*  HCT 37.1* 35.2*  MCV 90.3 90.5  PLT 269 259   Basic Metabolic Panel: Recent Labs  Lab 05/25/23 2130 05/26/23 0500  NA 126* 127*  K 4.5 4.0  CL 95* 97*  CO2 20* 22  GLUCOSE 266* 324*  BUN 14 11  CREATININE 0.81 0.74  CALCIUM  9.0 8.5*     Recent Results (from the past 240 hours)  Resp  panel by RT-PCR (RSV, Flu A&B, Covid)     Status: None   Collection Time: 05/25/23  9:05 PM  Result Value Ref Range Status   SARS Coronavirus 2 by RT PCR NEGATIVE NEGATIVE Final    Comment: (NOTE) SARS-CoV-2 target nucleic acids are NOT DETECTED.  The SARS-CoV-2 RNA is generally detectable in upper respiratory specimens during the acute phase of infection. The lowest concentration of SARS-CoV-2 viral copies this assay can detect is 138 copies/mL. A negative result does not preclude SARS-Cov-2 infection and should not be used as the sole  basis for treatment or other patient management decisions. A negative result may occur with  improper specimen collection/handling, submission of specimen other than nasopharyngeal swab, presence of viral mutation(s) within the areas targeted by this assay, and inadequate number of viral copies(<138 copies/mL). A negative result must be combined with clinical observations, patient history, and epidemiological information. The expected result is Negative.  Fact Sheet for Patients:  bloggercourse.com  Fact Sheet for Healthcare Providers:  seriousbroker.it  This test is no t yet approved or cleared by the [place]  FDA and  has been authorized for detection and/or diagnosis of SARS-CoV-2 by FDA under an Emergency Use Authorization (EUA). This EUA will remain  in effect (meaning this test can be used) for the duration of the COVID-19 declaration under Section 564(b)(1) of the Act, 21 U.S.C.section 360bbb-3(b)(1), unless the authorization is terminated  or revoked sooner.       Influenza A by PCR NEGATIVE NEGATIVE Final   Influenza B by PCR NEGATIVE NEGATIVE Final    Comment: (NOTE) The Xpert Xpress SARS-CoV-2/FLU/RSV plus assay is intended as an aid in the diagnosis of influenza from Nasopharyngeal swab specimens and should not be used as a sole basis for treatment. Nasal washings and aspirates  are unacceptable for Xpert Xpress SARS-CoV-2/FLU/RSV testing.  Fact Sheet for Patients: bloggercourse.com  Fact Sheet for Healthcare Providers: seriousbroker.it  This test is not yet approved or cleared by the [place]  FDA and has been authorized for detection and/or diagnosis of SARS-CoV-2 by FDA under an Emergency Use Authorization (EUA). This EUA will remain in effect (meaning this test can be used) for the duration of the COVID-19 declaration under Section 564(b)(1) of the Act, 21 U.S.C. section 360bbb-3(b)(1), unless the authorization is terminated or revoked.     Resp Syncytial Virus by PCR NEGATIVE NEGATIVE Final    Comment: (NOTE) Fact Sheet for Patients: bloggercourse.com  Fact Sheet for Healthcare Providers: seriousbroker.it  This test is not yet approved or cleared by the [place]  FDA and has been authorized for detection and/or diagnosis of SARS-CoV-2 by FDA under an Emergency Use Authorization (EUA). This EUA will remain in effect (meaning this test can be used) for the duration of the COVID-19 declaration under Section 564(b)(1) of the Act, 21 U.S.C. section 360bbb-3(b)(1), unless the authorization is terminated or revoked.  Performed at [facility], [address] [place], [place] [address]      Radiology Studies: DG Chest Portable 1 View Result Date: 05/26/2023 CLINICAL DATA:  Increased heart rate and shortness of breath. EXAM: PORTABLE CHEST 1 VIEW COMPARISON:  May 25, 2023 FINDINGS: Multiple sternal wires are noted. Cardiac silhouette is mildly enlarged and unchanged in size. Low lung volumes are seen. Mild atelectasis is present within the bilateral lung bases. No pleural effusion or pneumothorax is identified. Multilevel degenerative changes are seen throughout the thoracic spine. IMPRESSION: 1. Evidence of prior median  sternotomy. 2. Low lung volumes with mild bibasilar atelectasis. Electronically Signed   By: Suzen Dials M.D.   On: 05/26/2023 01:14   DG Chest Portable 1 View Result Date: 05/25/2023 CLINICAL DATA:  Cough, sepsis EXAM: PORTABLE CHEST 1 VIEW COMPARISON:  07/18/2021 FINDINGS: Single frontal view of the chest demonstrates stable postsurgical changes from median sternotomy. The cardiac silhouette is enlarged. Lung volumes are diminished, with crowding of the central vasculature. No airspace disease, effusion, or pneumothorax. IMPRESSION: 1. Low lung volumes.  No acute airspace disease. Electronically Signed   By: Ozell Delores HERO.D.  On: 05/25/2023 21:52    Scheduled Meds:  insulin  aspart  0-15 Units Subcutaneous TID WC   insulin  aspart  0-5 Units Subcutaneous QHS   tamsulosin   0.4 mg Oral Daily   Continuous Infusions:  ceFEPime  (MAXIPIME ) IV 2 g (05/26/23 0350)   dilTIAZem  HCl-Dextrose  15 mg/hr (05/26/23 0214)   heparin  1,300 Units/hr (05/26/23 0143)   lactated ringers  Stopped (05/26/23 0045)   lactated ringers  150 mL/hr (05/26/23 0432)   metronidazole      vancomycin        LOS: 0 days   Ivonne Mustache, MD [facility] P2/12/2023, 8:06 AM

## 2023-05-26 NOTE — H&P (Signed)
 History and Physical    Patient: Jonathan Owen DOB: 28-May-1927 DOA: 05/25/2023 DOS: the patient was seen and examined on 05/26/2023 PCP: Dottie Norleen PHEBE PONCE, MD  Patient coming from: Home  Chief Complaint:  Chief Complaint  Patient presents with   Shortness of Breath   Weakness   HPI: Jonathan Owen is a 89 y.o. male with medical history significant of non-insulin -dependent diabetes, essential hypertension, history of colon cancer, osteoarthritis, who presents from home with shortness of breath that started last night with chills and fever.  He has been having flulike symptoms in the last few weeks on and off.  Patient on arrival has a temperature of 101 and heart rate of 131.  He is having significant chills.  Appears to have sepsis but the source is not clear.  Urinalysis is negative.  Chest x-ray showed no acute findings.  No other obvious source at the moment.  With the shortness of breath early pneumonia suspected.  While in the ER patient went into A-fib with RVR which is new for the patient.  No prior history of A-fib.  At this point we will admit the patient for treatment of sepsis of unknown cause as well as atrial fibrillation with rapid ventricular response.  Review of Systems: As mentioned in the history of present illness. All other systems reviewed and are negative. Past Medical History:  Diagnosis Date   Arthritis    Cancer (HCC)    hx colon cancer   Diabetes mellitus without complication (HCC)    no meds-watches diet   Hypertension    no meds now-   Wears dentures    upper-partial bottom   Wears glasses    Past Surgical History:  Procedure Laterality Date   CARPAL TUNNEL RELEASE  03/04/2012   Procedure: CARPAL TUNNEL RELEASE;  Surgeon: Arley JONELLE Curia, MD;  Location: [facility];  Service: Orthopedics;  Laterality: Right;   CARPAL TUNNEL RELEASE Left 09/09/2012   Procedure: CARPAL TUNNEL RELEASE;  Surgeon: Arley JONELLE Curia, MD;  Location:  [facility];  Service: Orthopedics;  Laterality: Left;   COLONOSCOPY     COLONOSCOPY W/ BIOPSIES AND POLYPECTOMY     PARTIAL COLECTOMY  2006   sigmoid   STERNOTOMY  2006   medial-thymectomy   UPPER GASTROINTESTINAL ENDOSCOPY  2006   Social History:  reports that he has never smoked. He has never used smokeless tobacco. He reports that he does not drink alcohol and does not use drugs.  Allergies  Allergen Reactions   Tape Other (See Comments)    Unknown    History reviewed. No pertinent family history.  Prior to Admission medications   Medication Sig Start Date End Date Taking? Authorizing Provider  amoxicillin (AMOXIL) 875 MG tablet Take 875 mg by mouth 2 (two) times daily. 04/22/23  Yes [provider]  apixaban  (ELIQUIS ) 5 MG TABS tablet Take 10 mg by mouth 2 (two) times daily. 08/03/18  Yes [provider]  baclofen  (LIORESAL ) 10 MG tablet TAKE 1 TABLET BY MOUTH THREE TIMES A DAY Patient taking differently: Take 10 mg by mouth as needed for muscle spasms. 11/05/22  Yes Williams, Megan E, NP  tamsulosin  (FLOMAX ) 0.4 MG CAPS capsule Take 0.4 mg by mouth daily.   Yes [provider]  TOUJEO  SOLOSTAR 300 UNIT/ML Solostar Pen Inject 22 Units into the skin in the morning.   Yes [provider]  zolpidem (AMBIEN) 5 MG tablet Take 5 mg by  mouth at bedtime as needed. 08/16/22  Yes [provider]  aspirin 81 MG tablet Take 81 mg by mouth daily.    [provider]  clonazePAM  (KLONOPIN ) 0.5 MG tablet Take 0.5 mg by mouth daily. Patient not taking: Reported on 05/25/2023    [provider]  Insulin  Aspart FlexPen (NOVOLOG ) 100 UNIT/ML USE AS DIRECTED. MAX DAILY DOSE IS 50 UNITS. Patient not taking: Reported on 05/25/2023 08/01/20   [provider]  LORazepam  (ATIVAN ) 1 MG tablet Take 0.5 mg by mouth at bedtime as needed.     [provider]  traMADol  (ULTRAM ) 50 MG tablet Take 1 tablet (50 mg total) by mouth  every 8 (eight) hours as needed for moderate pain or severe pain. 09/24/22 09/24/23  Trudy Duwaine BRAVO, NP    Physical Exam: Vitals:   05/25/23 2245 05/25/23 2300 05/25/23 2345 05/26/23 0000  BP: 113/77 (!) 146/79 104/80   Pulse: (!) 121 (!) 122 (!) 121   Resp: (!) 21 (!) 24 20   Temp:      TempSrc:      SpO2: 94% 94% 99%   Weight:    93.2 kg  Height:    5' 6.5 (1.689 m)   Constitutional: Acutely ill looking, having chills, NAD, calm, comfortable Eyes: PERRL, lids and conjunctivae normal ENMT: Mucous membranes are moist. Posterior pharynx clear of any exudate or lesions.Normal dentition.  Neck: normal, supple, no masses, no thyromegaly Respiratory: clear to auscultation bilaterally, no wheezing, no crackles. Normal respiratory effort. No accessory muscle use.  Cardiovascular: Irregularly irregular with tachycardia, no murmurs / rubs / gallops. No extremity edema. 2+ pedal pulses. No carotid bruits.  Abdomen: no tenderness, no masses palpated. No hepatosplenomegaly. Bowel sounds positive.  Musculoskeletal: Good range of motion, no joint swelling or tenderness, Skin: no rashes, lesions, ulcers. No induration Neurologic: CN 2-12 grossly intact. Sensation intact, DTR normal. Strength 5/5 in all 4.  Psychiatric: Normal judgment and insight. Alert and oriented x 3. Normal mood  Data Reviewed:  Temperature 101 blood pressure 151/81, pulse 127 respiratory rate of 30 oxygen sat 94% on room air.  White count 16.3, hemoglobin 12.3 and platelets 269.  Sodium is 126 potassium 4.5 blood 95 CO2 20 and lactic acid 2.0.  Glucose is 266.  Acute viral screen is negative chest x-ray showed low lung volumes but no acute findings.  EKG showed A-fib with RVR rate up to 160s  Assessment and Plan:  #1 sepsis of unknown source: Patient meets sepsis criteria with temperature 101, leukocytosis, tachycardia.  Will continue IV antibiotics, IV fluids follow culture results.  #2 new onset A-fib with RVR: Initiate  diltiazem  drip.  Patient is in the 90s.  Consider heparin  drip as well to see if he can tolerate.  There is a high risk of bleed however.  Continue to monitor.  #3 non-insulin -dependent diabetes: Initiate sliding scale insulin .  #4 hyponatremia: Not sure if this is due to dehydration.  Possibly chronic which may include dietary.  Will check serum osmolality, urine osmolality as well as spot urine sodium.  #5 essential hypertension: Continue blood pressure monitoring.    Advance Care Planning:   Code Status: Full Code   Consults: None but will get cardiology consult  Family Communication: No family at bedside  Severity of Illness: The appropriate patient status for this patient is INPATIENT. Inpatient status is judged to be reasonable and necessary in order to provide the required intensity of service to ensure the patient's safety. The  patient's presenting symptoms, physical exam findings, and initial radiographic and laboratory data in the context of their chronic comorbidities is felt to place them at high risk for further clinical deterioration. Furthermore, it is not anticipated that the patient will be medically stable for discharge from the hospital within 2 midnights of admission.   * I certify that at the point of admission it is my clinical judgment that the patient will require inpatient hospital care spanning beyond 2 midnights from the point of admission due to high intensity of service, high risk for further deterioration and high frequency of surveillance required.*  AuthorBETHA SIM KNOLL, MD 05/26/2023 12:50 AM  For on call review [email or web address].

## 2023-05-27 ENCOUNTER — Inpatient Hospital Stay (HOSPITAL_COMMUNITY): Payer: Medicare Other

## 2023-05-27 DIAGNOSIS — A419 Sepsis, unspecified organism: Secondary | ICD-10-CM | POA: Diagnosis not present

## 2023-05-27 DIAGNOSIS — I480 Paroxysmal atrial fibrillation: Secondary | ICD-10-CM

## 2023-05-27 LAB — CBC
HCT: 32.9 % — ABNORMAL LOW (ref 39.0–52.0)
Hemoglobin: 10.6 g/dL — ABNORMAL LOW (ref 13.0–17.0)
MCH: 29.3 pg (ref 26.0–34.0)
MCHC: 32.2 g/dL (ref 30.0–36.0)
MCV: 90.9 fL (ref 80.0–100.0)
Platelets: 176 10*3/uL (ref 150–400)
RBC: 3.62 MIL/uL — ABNORMAL LOW (ref 4.22–5.81)
RDW: 13.2 % (ref 11.5–15.5)
WBC: 12.3 10*3/uL — ABNORMAL HIGH (ref 4.0–10.5)
nRBC: 0 % (ref 0.0–0.2)

## 2023-05-27 LAB — COMPREHENSIVE METABOLIC PANEL
ALT: 25 U/L (ref 0–44)
AST: 50 U/L — ABNORMAL HIGH (ref 15–41)
Albumin: 2.4 g/dL — ABNORMAL LOW (ref 3.5–5.0)
Alkaline Phosphatase: 55 U/L (ref 38–126)
Anion gap: 9 (ref 5–15)
BUN: 12 mg/dL (ref 8–23)
CO2: 21 mmol/L — ABNORMAL LOW (ref 22–32)
Calcium: 8.2 mg/dL — ABNORMAL LOW (ref 8.9–10.3)
Chloride: 98 mmol/L (ref 98–111)
Creatinine, Ser: 0.68 mg/dL (ref 0.61–1.24)
GFR, Estimated: >60 mL/min (ref >60)
Glucose, Bld: 148 mg/dL — ABNORMAL HIGH (ref 70–99)
Potassium: 4 mmol/L (ref 3.5–5.1)
Sodium: 128 mmol/L — ABNORMAL LOW (ref 135–145)
Total Bilirubin: 1.1 mg/dL (ref 0.0–1.2)
Total Protein: 5.5 g/dL — ABNORMAL LOW (ref 6.5–8.1)

## 2023-05-27 LAB — BLOOD CULTURE ID PANEL (REFLEXED) - BCID2

## 2023-05-27 LAB — GLUCOSE, CAPILLARY
Glucose-Capillary: 144 mg/dL — ABNORMAL HIGH (ref 70–99)
Glucose-Capillary: 136 mg/dL — ABNORMAL HIGH (ref 70–99)
Glucose-Capillary: 128 mg/dL — ABNORMAL HIGH (ref 70–99)
Glucose-Capillary: 138 mg/dL — ABNORMAL HIGH (ref 70–99)
Glucose-Capillary: 124 mg/dL — ABNORMAL HIGH (ref 70–99)

## 2023-05-27 LAB — LACTIC ACID, PLASMA
Lactic Acid, Venous: 1.5 mmol/L (ref 0.5–1.9)
Lactic Acid, Venous: 1.2 mmol/L (ref 0.5–1.9)

## 2023-05-27 LAB — LIPASE, BLOOD: Lipase: 24 U/L (ref 11–51)

## 2023-05-27 LAB — HEPARIN LEVEL (UNFRACTIONATED): Heparin Unfractionated: <0.1 [IU]/mL — ABNORMAL LOW (ref 0.30–0.70)

## 2023-05-27 LAB — MAGNESIUM: Magnesium: 1.3 mg/dL — ABNORMAL LOW (ref 1.7–2.4)

## 2023-05-27 LAB — TSH: TSH: 2.253 u[IU]/mL (ref 0.350–4.500)

## 2023-05-27 LAB — MRSA NEXT GEN BY PCR, NASAL: MRSA by PCR Next Gen: NOT DETECTED

## 2023-05-27 MED ORDER — TECHNETIUM TC 99M MEBROFENIN IV KIT
4.9700 | PACK | Freq: Once | INTRAVENOUS | Status: AC
  Administered 2023-05-27: 4.97 via INTRAVENOUS

## 2023-05-27 MED ORDER — MORPHINE SULFATE (PF) 4 MG/ML IV SOLN: 3.0000 mg | Freq: Once | INTRAVENOUS | Status: DC

## 2023-05-27 MED ORDER — MORPHINE SULFATE (PF) 2 MG/ML IV SOLN
3.0000 mg | Freq: Once | INTRAVENOUS | Status: AC
  Administered 2023-05-27: 3 mg via INTRAVENOUS

## 2023-05-27 MED ORDER — METOPROLOL TARTRATE 25 MG PO TABS
25.0000 mg | ORAL_TABLET | Freq: Two times a day (BID) | ORAL | Status: DC
  Administered 2023-05-27 (×2): 25 mg via ORAL
  Filled 2023-05-27 (×2): qty 1

## 2023-05-27 MED ORDER — METOPROLOL TARTRATE 5 MG/5ML IV SOLN
2.0000 mg | Freq: Once | INTRAVENOUS | Status: AC | PRN
  Administered 2023-05-27: 2 mg via INTRAVENOUS
  Filled 2023-05-27: qty 5

## 2023-05-27 MED ORDER — HEPARIN BOLUS VIA INFUSION
2000.0000 [IU] | Freq: Once | INTRAVENOUS | Status: AC
  Administered 2023-05-27: 2000 [IU] via INTRAVENOUS
  Filled 2023-05-27: qty 2000

## 2023-05-27 MED ORDER — HEPARIN (PORCINE) 25000 UT/250ML-% IV SOLN
1550.0000 [IU]/h | INTRAVENOUS | Status: DC
  Administered 2023-05-27: 1450 [IU]/h via INTRAVENOUS
  Administered 2023-05-27: 1200 [IU]/h via INTRAVENOUS
  Filled 2023-05-27 (×2): qty 250

## 2023-05-27 MED ORDER — MORPHINE SULFATE (PF) 2 MG/ML IV SOLN
INTRAVENOUS | Status: AC
  Filled 2023-05-27: qty 2

## 2023-05-27 MED ORDER — CHLORHEXIDINE GLUCONATE CLOTH 2 % EX PADS
6.0000 | MEDICATED_PAD | Freq: Every day | CUTANEOUS | Status: DC
  Administered 2023-05-27 – 2023-05-31 (×4): 6 via TOPICAL

## 2023-05-27 MED ORDER — ORAL CARE MOUTH RINSE: 15.0000 mL | OROMUCOSAL | Status: DC | PRN

## 2023-05-27 MED ORDER — DILTIAZEM HCL-DEXTROSE 125-5 MG/125ML-% IV SOLN (PREMIX)
5.0000 mg/h | INTRAVENOUS | Status: DC
  Administered 2023-05-27: 5 mg/h via INTRAVENOUS
  Administered 2023-05-27: 10 mg/h via INTRAVENOUS
  Administered 2023-05-28: 15 mg/h via INTRAVENOUS
  Administered 2023-05-28: 10 mg/h via INTRAVENOUS
  Administered 2023-05-29 – 2023-05-30 (×4): 15 mg/h via INTRAVENOUS
  Filled 2023-05-27 (×15): qty 125

## 2023-05-27 MED ORDER — LACTATED RINGERS IV SOLN: INTRAVENOUS | Status: AC

## 2023-05-27 MED ORDER — VANCOMYCIN HCL IN DEXTROSE 1-5 GM/200ML-% IV SOLN: 1000.0000 mg | INTRAVENOUS | Status: DC

## 2023-05-27 MED ORDER — HEPARIN BOLUS VIA INFUSION
4000.0000 [IU] | Freq: Once | INTRAVENOUS | Status: AC
  Administered 2023-05-27: 4000 [IU] via INTRAVENOUS
  Filled 2023-05-27: qty 4000

## 2023-05-27 MED ORDER — MAGNESIUM SULFATE 2 GM/50ML IV SOLN
2.0000 g | Freq: Once | INTRAVENOUS | Status: AC
  Administered 2023-05-27: 2 g via INTRAVENOUS
  Filled 2023-05-27: qty 50

## 2023-05-27 MED ORDER — LORAZEPAM 2 MG/ML IJ SOLN
0.2500 mg | Freq: Once | INTRAMUSCULAR | Status: AC | PRN
  Administered 2023-05-27: 0.25 mg via INTRAVENOUS
  Filled 2023-05-27: qty 1

## 2023-05-27 NOTE — Progress Notes (Signed)
 PHARMACY NOTE:  ANTIMICROBIAL RENAL DOSAGE ADJUSTMENT  Current antimicrobial regimen includes a mismatch between antimicrobial dosage and estimated renal function.  As per policy approved by the Pharmacy & Therapeutics and Medical Executive Committees, the antimicrobial dosage will be adjusted accordingly.  Current antimicrobial dosage: vancomycin  1750 mg IV q24h  Indication: sepsis  Renal Function:  Estimated Creatinine Clearance: 62.3 mL/min (by C-G formula based on SCr of 0.68 mg/dL).     Antimicrobial dosage has been changed to: vancomycin  1000 mg IV q24h   Thank you for allowing pharmacy to be a part of this patient's care.  Lolita Rise, PharmD, BCPS Clinical Pharmacist 05/27/2023 7:26 AM

## 2023-05-27 NOTE — Progress Notes (Signed)
 PHARMACY - ANTICOAGULATION CONSULT NOTE  Pharmacy Consult for heparin  Indication: atrial fibrillation  Allergies  Allergen Reactions   Tape Other (See Comments)    Unknown    Patient Measurements: Height: 5\' 11"  (180.3 cm) Weight: 97.6 kg (215 lb 2.7 oz) IBW/kg (Calculated) : 75.3 Heparin  Dosing Weight: 84 kg  Vital Signs: Temp: 98.1 F (36.7 C) (02/10 1800) Temp Source: Axillary (02/10 1800) BP: 114/65 (02/10 1800) Pulse Rate: 130 (02/10 1800)  Labs: Recent Labs    05/25/23 2042 05/25/23 2055 05/25/23 2130 05/26/23 0500 05/26/23 1038 05/27/23 0512 05/27/23 1818  HGB 12.3*  --   --  11.8*  --  10.6*  --   HCT 37.1*  --   --  35.2*  --  32.9*  --   PLT 269  --   --  259  --  176  --   APTT  --  25  --   --   --   --   --   LABPROT  --  15.2  --  15.7*  --   --   --   INR  --  1.2  --  1.2  --   --   --   HEPARINUNFRC  --   --   --   --  <0.10*  --  <0.10*  CREATININE  --   --  0.81 0.74  --  0.68  --     Estimated Creatinine Clearance: 68.7 mL/min (by C-G formula based on SCr of 0.68 mg/dL).   Medical History: Past Medical History:  Diagnosis Date   Arthritis    Cancer (HCC)    hx colon cancer   Diabetes mellitus without complication (HCC)    no meds-watches diet   Hypertension    no meds now-   Wears dentures    upper-partial bottom   Wears glasses      Assessment: 89 yo male with afib with RVR; later in sinus tachycardia. Heparin  and diltiazem  stopped as he had converted to NSR. Patient now back in afib and heparin  to restart.  Pt has taken apixaban  in the past, says he doesn't take anymore, no recent fill hx (daughter confirmed pt is no longer taking apixaban ).  Heparin  level SUBtherapeutic on current heparin  rate of 1200 units/hr Per RN, no bleeding and confirms that IV heparin  was running the entire time patient was down getting a HIDA scan performed  Goal of Therapy:  Heparin  level 0.3-0.7 units/ml Monitor platelets by anticoagulation  protocol: Yes   Plan:  -Heparin  bolus of 2000 units -Increase heparin  infusion from 1200 units/hr to 1450 units/hr -Check heparin  level in 8 hours -Daily CBC   Luise Saint, PharmD, BCPS Secure Chat if ?s 05/27/2023 6:51 PM

## 2023-05-27 NOTE — Consult Note (Addendum)
Chief Complaint: Recent fever, chills, dyspnea, cholecystitis; referred for percutaneous cholecystostomy  Referring Provider(s): Maczis,M, PA-C  Supervising Physician: Oley Balm  Patient Status: [facility] - In-pt  History of Present Illness: Jonathan Owen is a 89 y.o. male with past medical history significant for arthritis, colon cancer with prior partial colectomy 2006, diabetes, prior PE, hypertension who was admitted to [facility] on 06/22/2023 with fever, chills, weakness, dyspnea.  CT chest was negative for acute intrathoracic process.  CT abdomen pelvis revealed gallbladder wall thickening and pericholecystic fat stranding, trace right pleural effusion, third portion of duodenum diverticula.  Ultrasound of abdomen right upper quadrant revealed distended gallbladder with sludge and moderate wall thickening, no gallstones, findings equivocal for acute cholecystitis.  Patient currently afebrile, WBC 11.0, hemoglobin 11.7, platelets normal, total bilirubin normal, creatinine normal, PT 15.7, INR 1.2.  Blood culture negative to date.  Respiratory panel negative.  Patient on IV heparin for new onset A-fib.  Also on IV Flagyl and Maxipime. HIDA scan revealed nonvisualization of the gallbladder despite morphine administration, consistent with acute cholecystitis. Request now received from surgical team for percutaneous cholecystostomy.        Patient is Full Code  Past Medical History:  Diagnosis Date   Arthritis    Cancer (HCC)    hx colon cancer   Diabetes mellitus without complication (HCC)    no meds-watches diet   Hypertension    no meds now-   Wears dentures    upper-partial bottom   Wears glasses     Past Surgical History:  Procedure Laterality Date   CARPAL TUNNEL RELEASE  03/04/2012   Procedure: CARPAL TUNNEL RELEASE;  Surgeon: Nicki Reaper, MD;  Location: [facility];  Service: Orthopedics;  Laterality: Right;   CARPAL TUNNEL RELEASE Left  09/09/2012   Procedure: CARPAL TUNNEL RELEASE;  Surgeon: Nicki Reaper, MD;  Location: [facility];  Service: Orthopedics;  Laterality: Left;   COLONOSCOPY     COLONOSCOPY W/ BIOPSIES AND POLYPECTOMY     PARTIAL COLECTOMY  2006   sigmoid   STERNOTOMY  2006   medial-thymectomy   UPPER GASTROINTESTINAL ENDOSCOPY  2006    Allergies: Tape  Medications: Prior to Admission medications   Medication Sig Start Date End Date Taking? Authorizing Provider  cholecalciferol (VITAMIN D3) 25 MCG (1000 UNIT) tablet Take 3,000 Units by mouth daily.   Yes [provider]  clonazePAM (KLONOPIN) 0.5 MG tablet Take 0.5 mg by mouth 2 (two) times daily as needed.   Yes [provider]  gabapentin (NEURONTIN) 300 MG capsule Take 300 mg by mouth at bedtime.   Yes [provider]  Melatonin 10 MG TABS Take 20 mg by mouth at bedtime as needed (Sleep).   Yes [provider]  tamsulosin (FLOMAX) 0.4 MG CAPS capsule Take 0.4 mg by mouth daily.   Yes [provider]  TOUJEO SOLOSTAR 300 UNIT/ML Solostar Pen Inject 22 Units into the skin in the morning.   Yes [provider]  amoxicillin (AMOXIL) 875 MG tablet Take 875 mg by mouth 2 (two) times daily. Patient not taking: Reported on 05/26/2023 04/22/23   [provider]  aspirin 81 MG tablet Take 81 mg by mouth daily. Patient not taking: Reported on 05/26/2023    [provider]  baclofen (LIORESAL) 10 MG tablet TAKE 1 TABLET BY MOUTH THREE TIMES A DAY Patient not taking: Reported on 05/26/2023 11/05/22   Juanda Chance, NP  Insulin  Aspart FlexPen (NOVOLOG) 100 UNIT/ML USE AS DIRECTED. MAX DAILY DOSE IS 50 UNITS. Patient not taking: Reported on 05/25/2023 08/01/20   [provider]  LORazepam (ATIVAN) 1 MG tablet Take 0.5 mg by mouth at bedtime as needed.  Patient not taking: Reported on 05/26/2023    [provider]  traMADol (ULTRAM) 50 MG tablet Take 1 tablet (50 mg total)  by mouth every 8 (eight) hours as needed for moderate pain or severe pain. Patient not taking: Reported on 05/26/2023 09/24/22 09/24/23  Juanda Chance, NP  zolpidem (AMBIEN) 5 MG tablet Take 5 mg by mouth at bedtime as needed. Patient not taking: Reported on 05/26/2023 08/16/22   [provider]     History reviewed. No pertinent family history.  Social History   Socioeconomic History   Marital status: Widowed    Spouse name: Not on file   Number of children: Not on file   Years of education: Not on file   Highest education level: Not on file  Occupational History   Not on file  Tobacco Use   Smoking status: Never   Smokeless tobacco: Never  Substance and Sexual Activity   Alcohol use: No   Drug use: No   Sexual activity: Not on file  Other Topics Concern   Not on file  Social History Narrative   Not on file   Social Drivers of Health   Financial Resource Strain: Not on file  Food Insecurity: No Food Insecurity (05/26/2023)   Hunger Vital Sign    Worried About Running Out of Food in the Last Year: Never true    Ran Out of Food in the Last Year: Never true  Transportation Needs: No Transportation Needs (05/26/2023)   PRAPARE - Administrator, Civil Service (Medical): No    Lack of Transportation (Non-Medical): No  Physical Activity: Not on file  Stress: Not on file  Social Connections: Unknown (05/26/2023)   Social Connection and Isolation Panel [NHANES]    Frequency of Communication with Friends and Family: Once a week    Frequency of Social Gatherings with Friends and Family: Once a week    Attends Religious Services: Patient unable to answer    Active Member of Clubs or Organizations: Patient unable to answer    Attends Banker Meetings: Patient unable to answer    Marital Status: Widowed       Review of Systems currently denies fever,HA,CP, worsening dyspnea, abd pain, back pain, N/V or bleeding; he does have occ cough,  fatigue  Vital Signs: BP 122/61   Pulse (!) 103   Temp 98.1 F (36.7 C) (Oral)   Resp (!) 27   Ht 5\' 11"  (1.803 m)   Wt 215 lb 2.7 oz (97.6 kg)   SpO2 99%   BMI 30.01 kg/m   Advance Care Plan: The advanced care place/surrogate decision maker was discussed at the time of visit and the patient did not wish to discuss or was not able to name a surrogate decision maker or provide an advance care plan.  Physical Exam: awake, answers simple questions ok, daughter in room; chest- CTA bilat; heart- sl tachy, irreg; abd-soft,+BS,NT; no sig LE edema  Imaging: CT ABDOMEN PELVIS W CONTRAST Result Date: 05/26/2023 CLINICAL DATA:  Sepsis . Pt was very agitated and fidgety through the entire exam. Best images attainable. EXAM: CT ABDOMEN AND PELVIS WITH CONTRAST TECHNIQUE: Multidetector CT imaging of the abdomen and pelvis was performed using the standard  protocol following bolus administration of intravenous contrast. RADIATION DOSE REDUCTION: This exam was performed according to the departmental dose-optimization program which includes automated exposure control, adjustment of the mA and/or kV according to patient size and/or use of iterative reconstruction technique. CONTRAST:  OMNIPAQUE IOHEXOL 300 MG/ML  SOLN COMPARISON:  X-ray lumbar 03/19/2022, CT abdomen pelvis 11/10/2007 FINDINGS: Lower chest: Trace right pleural effusion. Hepatobiliary: Limited evaluation due to motion artifact with gallbladder wall thickening and pericholecystic fat stranding. Similar-appearing hypodensity of the left hepatic lobe likely a simple hepatic cyst (2:22). No CT evidence of gallstones. No pericholecystic fluid. No biliary dilatation. Pancreas: No focal lesion. Normal pancreatic contour. No surrounding inflammatory changes. No main pancreatic ductal dilatation. Spleen: Normal in size without focal abnormality. Adrenals/Urinary Tract: No adrenal nodule bilaterally. Bilateral kidneys enhance symmetrically. Parapelvic  simple cysts of the left kidney. Simple renal cysts, in the absence of clinically indicated signs/symptoms, require no independent follow-up. No hydronephrosis. No hydroureter.  No nephroureterolithiasis. The urinary bladder is unremarkable. Minimal excretion of intravenous contrast of either kidneys on delayed view. Stomach/Bowel: Stomach is within normal limits. No evidence of bowel wall thickening or dilatation. Third portion of the duodenum diverticula. Appendix appears normal. Vascular/Lymphatic: No abdominal aorta or iliac aneurysm. Severe atherosclerotic plaque of the aorta and its branches. No abdominal, pelvic, or inguinal lymphadenopathy. Reproductive: Prostate is unremarkable. Other: No intraperitoneal free fluid. No intraperitoneal free gas. No organized fluid collection. Musculoskeletal: No abdominal wall hernia or abnormality. No suspicious lytic or blastic osseous lesions. No acute displaced fracture. Chronic severe L1 compression fracture. Multilevel severe degenerative changes of the spine. Partially visualized left femur intramedullary nail fixation. IMPRESSION: 1. Gallbladder wall thickening and pericholecystic fat stranding. Question acute cholecystitis. Finding better evaluated on ultrasound abdomen 05/26/2023. 2. Trace right pleural effusion. 3. Third portion of the duodenum diverticula. Electronically Signed   By: Tish Frederickson M.D.   On: 05/26/2023 22:24   US Abdomen Limited RUQ (LIVER/GB) Result Date: 05/26/2023 CLINICAL DATA:  Fever.  Abnormal gallbladder on chest CT. EXAM: ULTRASOUND ABDOMEN LIMITED RIGHT UPPER QUADRANT COMPARISON:  CT chest from same day. CT abdomen pelvis dated November 10, 2007. FINDINGS: Gallbladder: Distended with sludge. Moderate wall thickening. No gallstones. No sonographic Murphy sign noted by sonographer. Common bile duct: Not visualized, but nondilated on earlier CT. Liver: No focal lesion identified. Within normal limits in parenchymal echogenicity. Portal vein  is patent on color Doppler imaging with normal direction of blood flow towards the liver. Other: None. IMPRESSION: 1. Distended gallbladder with sludge and moderate wall thickening. No gallstones. Findings are equivocal for acute cholecystitis. Consider further evaluation with nuclear medicine hepatobiliary scan. Electronically Signed   By: Obie Dredge M.D.   On: 05/26/2023 15:30   CT CHEST WO CONTRAST Result Date: 05/26/2023 CLINICAL DATA:  Shortness of breath. Cold symptoms for the past few weeks. EXAM: CT CHEST WITHOUT CONTRAST TECHNIQUE: Multidetector CT imaging of the chest was performed following the standard protocol without IV contrast. RADIATION DOSE REDUCTION: This exam was performed according to the departmental dose-optimization program which includes automated exposure control, adjustment of the mA and/or kV according to patient size and/or use of iterative reconstruction technique. COMPARISON:  Chest x-ray from same day. CT chest dated February 04, 2021. FINDINGS: Cardiovascular: No significant vascular findings. Normal heart size. No pericardial effusion. No thoracic aortic aneurysm. Coronary, aortic arch, and branch vessel atherosclerotic vascular disease. Mediastinum/Nodes: No enlarged mediastinal or axillary lymph nodes. Thyroid gland, trachea, and esophagus demonstrate no significant findings. Lungs/Pleura: Bibasilar  subsegmental atelectasis. No focal consolidation, pleural effusion, or pneumothorax. Upper Abdomen: Distended gallbladder with mild wall thickening and surrounding inflammatory change. Musculoskeletal: No chest wall mass or suspicious bone lesions identified. IMPRESSION: 1. No acute intrathoracic process. 2. Distended gallbladder with mild wall thickening and surrounding inflammatory change, concerning for acute cholecystitis. Correlate with right upper quadrant pain and consider further evaluation with right upper quadrant ultrasound. 3.  Aortic Atherosclerosis (ICD10-I70.0).  Electronically Signed   By: Obie Dredge M.D.   On: 05/26/2023 13:29   ECHOCARDIOGRAM COMPLETE Result Date: 05/26/2023    ECHOCARDIOGRAM REPORT   Patient Name:   Jonathan Owen Pankratz Date of Exam: 05/26/2023 Medical Rec #:  [id number]         Height:       66.5 in Accession #:    [id number]        Weight:       205.5 lb Date of Birth:  01-23-1928          BSA:          2.034 m Patient Age:    89 years          BP:           136/87 mmHg Patient Gender: M                 HR:           100 bpm. Exam Location:  Inpatient Procedure: 2D Echo, Cardiac Doppler and Color Doppler Indications:    Arrhythmia  History:        Patient has no prior history of Echocardiogram examinations.                 Risk Factors:Hypertension and Diabetes.  Sonographer:    Darlys Gales Referring Phys: [id number] MOHAMMAD L GARBA IMPRESSIONS  1. Left ventricular ejection fraction, by estimation, is 60 to 65%. The left ventricle has normal function. Left ventricular endocardial border not optimally defined to evaluate regional wall motion. Left ventricular diastolic parameters are consistent with Grade I diastolic dysfunction (impaired relaxation).  2. Right ventricular systolic function was not well visualized. The right ventricular size is normal.  3. Left atrial size was moderately dilated.  4. The mitral valve is grossly normal. No evidence of mitral valve regurgitation. No evidence of mitral stenosis.  5. The aortic valve was not well visualized. There is moderate calcification of the aortic valve. Aortic valve regurgitation is not visualized. Aortic valve sclerosis/calcification is present, without any evidence of aortic stenosis. Aortic valve area, by VTI measures 2.09 cm. Aortic valve mean gradient measures 11.0 mmHg. Aortic valve Vmax measures 1.92 m/s.  6. Aortic dilatation noted. There is mild dilatation of the aortic root, measuring 42 mm. Normal size of ascending aorta. Comparison(s): No prior Echocardiogram. FINDINGS  Left Ventricle: Left  ventricular ejection fraction, by estimation, is 60 to 65%. The left ventricle has normal function. Left ventricular endocardial border not optimally defined to evaluate regional wall motion. The left ventricular internal cavity size was normal in size. There is no left ventricular hypertrophy. Left ventricular diastolic parameters are consistent with Grade I diastolic dysfunction (impaired relaxation). Normal left ventricular filling pressure. Right Ventricle: The right ventricular size is normal. No increase in right ventricular wall thickness. Right ventricular systolic function was not well visualized. Left Atrium: Left atrial size was moderately dilated. Right Atrium: Right atrial size was normal in size. Pericardium: There is no evidence of pericardial effusion. Mitral Valve: The mitral valve is grossly normal.  No evidence of mitral valve regurgitation. No evidence of mitral valve stenosis. Tricuspid Valve: The tricuspid valve is not well visualized. Tricuspid valve regurgitation is trivial. No evidence of tricuspid stenosis. Aortic Valve: The aortic valve was not well visualized. There is moderate calcification of the aortic valve. Aortic valve regurgitation is not visualized. Aortic valve sclerosis/calcification is present, without any evidence of aortic stenosis. Aortic valve mean gradient measures 11.0 mmHg. Aortic valve peak gradient measures 14.7 mmHg. Aortic valve area, by VTI measures 2.09 cm. Pulmonic Valve: The pulmonic valve was not well visualized. Pulmonic valve regurgitation is trivial. No evidence of pulmonic stenosis. Aorta: Aortic dilatation noted. There is mild dilatation of the aortic root, measuring 42 mm. Venous: The inferior vena cava was not well visualized. IAS/Shunts: The interatrial septum was not well visualized.  LEFT VENTRICLE PLAX 2D LVIDd:         4.90 cm   Diastology LVIDs:         3.60 cm   LV e' medial:    8.49 cm/s LV PW:         1.00 cm   LV E/e' medial:  9.8 LV IVS:         1.00 cm   LV e' lateral:   10.10 cm/s LVOT diam:     2.00 cm   LV E/e' lateral: 8.3 LV SV:         74 LV SV Index:   37 LVOT Area:     3.14 cm  RIGHT VENTRICLE RV S prime:     23.00 cm/s TAPSE (M-mode): 2.8 cm LEFT ATRIUM             Index LA Vol (A2C):   45.8 ml 22.51 ml/m LA Vol (A4C):   88.1 ml 43.31 ml/m LA Biplane Vol: 64.3 ml 31.61 ml/m  AORTIC VALVE AV Area (Vmax):    1.85 cm AV Area (Vmean):   1.73 cm AV Area (VTI):     2.09 cm AV Vmax:           192.00 cm/s AV Vmean:          164.000 cm/s AV VTI:            0.356 m AV Peak Grad:      14.7 mmHg AV Mean Grad:      11.0 mmHg LVOT Vmax:         113.00 cm/s LVOT Vmean:        90.400 cm/s LVOT VTI:          0.237 m LVOT/AV VTI ratio: 0.67  AORTA Ao Root diam: 4.20 cm MITRAL VALVE               TRICUSPID VALVE MV Area (PHT): 3.27 cm    TR Peak grad:   35.0 mmHg MV Decel Time: 232 msec    TR Vmax:        296.00 cm/s MV E velocity: 83.60 cm/s MV A velocity: 84.00 cm/s  SHUNTS MV E/A ratio:  1.00        Systemic VTI:  0.24 m                            Systemic Diam: 2.00 cm Vishnu Priya Mallipeddi Electronically signed by Winfield Rast Mallipeddi Signature Date/Time: 05/26/2023/11:18:32 AM    Final    DG Chest Portable 1 View Result Date: 05/26/2023 CLINICAL DATA:  Increased heart rate and shortness of breath. EXAM:  PORTABLE CHEST 1 VIEW COMPARISON:  May 25, 2023 FINDINGS: Multiple sternal wires are noted. Cardiac silhouette is mildly enlarged and unchanged in size. Low lung volumes are seen. Mild atelectasis is present within the bilateral lung bases. No pleural effusion or pneumothorax is identified. Multilevel degenerative changes are seen throughout the thoracic spine. IMPRESSION: 1. Evidence of prior median sternotomy. 2. Low lung volumes with mild bibasilar atelectasis. Electronically Signed   By: Aram Candela M.D.   On: 05/26/2023 01:14   DG Chest Portable 1 View Result Date: 05/25/2023 CLINICAL DATA:  Cough, sepsis EXAM: PORTABLE CHEST 1  VIEW COMPARISON:  07/18/2021 FINDINGS: Single frontal view of the chest demonstrates stable postsurgical changes from median sternotomy. The cardiac silhouette is enlarged. Lung volumes are diminished, with crowding of the central vasculature. No airspace disease, effusion, or pneumothorax. IMPRESSION: 1. Low lung volumes.  No acute airspace disease. Electronically Signed   By: Sharlet Salina M.D.   On: 05/25/2023 21:52    Labs:  CBC: Recent Labs    05/25/23 2042 05/26/23 0500 05/27/23 0512  WBC 16.3* 18.0* 12.3*  HGB 12.3* 11.8* 10.6*  HCT 37.1* 35.2* 32.9*  PLT 269 259 176    COAGS: Recent Labs    05/25/23 2055 05/26/23 0500  INR 1.2 1.2  APTT 25  --     BMP: Recent Labs    05/25/23 2130 05/26/23 0500 05/27/23 0512  NA 126* 127* 128*  K 4.5 4.0 4.0  CL 95* 97* 98  CO2 20* 22 21*  GLUCOSE 266* 324* 148*  BUN 14 11 12   CALCIUM 9.0 8.5* 8.2*  CREATININE 0.81 0.74 0.68  GFRNONAA >60 >60 >60    LIVER FUNCTION TESTS: Recent Labs    05/25/23 2130 05/26/23 0500 05/27/23 0512  BILITOT 2.1* 1.8* 1.1  AST 17 17 50*  ALT 12 12 25   ALKPHOS 58 54 55  PROT 6.8 5.8* 5.5*  ALBUMIN 3.2* 2.6* 2.4*    TUMOR MARKERS: No results for input(s): "AFPTM", "CEA", "CA199", "CHROMGRNA" in the last 8760 hours.  Assessment and Plan: 89 y.o. male with past medical history significant for arthritis, colon cancer with prior partial colectomy 2006, diabetes, prior PE, hypertension who was admitted to [facility] on 06/22/2023 with fever, chills, weakness, dyspnea.  CT chest was negative for acute intrathoracic process.  CT abdomen pelvis revealed gallbladder wall thickening and pericholecystic fat stranding, trace right pleural effusion, third portion of duodenum diverticula.  Ultrasound of abdomen right upper quadrant revealed distended gallbladder with sludge and moderate wall thickening, no gallstones, findings equivocal for acute cholecystitis.  Patient currently afebrile, WBC   11.0, hemoglobin 11.7, platelets normal,total bilirubin normal, creatinine normal, PT 15.7, INR 1.2.  Blood culture negative to date.  Respiratory panel negative.  Patient on IV heparin for new onset A-fib.  Also on IV Flagyl and Maxipime. HIDA scan revealed nonvisualization of the gallbladder despite morphine administration, consistent with acute cholecystitis. Request now received from surgical team for percutaneous cholecystostomy.Imaging studies were reviewed by Dr. Milford Cage. Risks and benefits discussed with the patient/daughter including bleeding, infection, damage to adjacent structures,  and sepsis.  All of the patient's questions were answered, patient is agreeable to proceed. Consent signed and in chart.  Procedure scheduled for today  Thank you for allowing our service to participate in Jonathan Owen 's care.  Electronically Signed: D. Jeananne Rama, PA-C   05/27/2023, 4:52 PM      I spent a total of 40 Minutes  in face to face in clinical consultation, greater than 50% of which was counseling/coordinating care for percutaneous cholecystostomy

## 2023-05-27 NOTE — Progress Notes (Signed)
 PT Cancellation Note  Patient Details Name: NIJAY LANSING MRN: [id number] DOB: 1927/11/05   Cancelled Treatment:    Reason Eval/Treat Not Completed: Medical issues which prohibited therapy. RN in room upon arrival, states pt's HR elevated, transferring to stepdown unit. Will continue to follow for PT eval as appropriate.    Tori Bethaney Oshana PT, DPT 05/27/23, 11:18 AM

## 2023-05-27 NOTE — Consult Note (Addendum)
 Cardiology Consultation   Patient ID: [name] MRN: [id number]; DOB: September 26, 1927  Admit date: 05/25/2023 Date of Consult: 05/27/2023  PCP:  Madelyne Schiff, MD   [facility] Providers Cardiologist:  New to Dr Emmette Harms   Patient Profile:   Jonathan Owen is a 89 y.o. male with a hx of DM, HTM, colon cancer, OA,  who is being seen 05/27/2023 for the evaluation of A fib RVR at the request of Dr Murray Arnold.  History of Present Illness:   [name] with above PMH presented to ER yesterday for SOB, fever, chills, feeling sick and flu like symptoms. He is a poor historian, oriented to self, aware that he is in the hospital, does not remember why he was here and is very sleepy during encounter today. He states he had no heart problems in the past, but then shortly after that he said he had chest pain before and does not remember when. He denied any chest pain, heart racing or palpitation, SOB this time. He inconsistently answered about pain of his abdomen.    Per chart review, he has type 2 DM and osteoporosis. No prior cardiac history. Diagnostic 05/25/23 showed Na 126, bicarb 20, glucose 266. Lactic acid 2.2 . WBC 16300. Hgb 12.3. INR 1.2. Resp viral swab negative.  UA + glucose and ketone. Subsequent CT on 05/26/23 showed Distended gallbladder with mild wall thickening and surrounding inflammatory change, concerning for acute cholecystitis. ABD U/S showed  Distended gallbladder with sludge and moderate wall thickening. No gallstones. Findings are equivocal for acute cholecystitis. HIDA pending. EKG and telemetry initially concerned for A fib RVR. He was admitted to hospitlaist for sepsis, blood culture NTD, started on vancomycin /cefepime /flagyl  and IVF. Due to recurrence of A fib RVR this morning, Cardizem  gtt was added. Cardiology is consulted now for further evaluation.   Echo from yesterday showed LVEF 60-65%, not optimal WMA assessment, grade I DD, RV systolic function not  well visulaized. Mod LAE. Aortic sclerosis. Mild dilatation of the aortic root 42 mm.    Past Medical History:  Diagnosis Date   Arthritis    Cancer (HCC)    hx colon cancer   Diabetes mellitus without complication (HCC)    no meds-watches diet   Hypertension    no meds now-   Wears dentures    upper-partial bottom   Wears glasses     Past Surgical History:  Procedure Laterality Date   CARPAL TUNNEL RELEASE  03/04/2012   Procedure: CARPAL TUNNEL RELEASE;  Surgeon: Kemp Patter, MD;  Location: [facility];  Service: Orthopedics;  Laterality: Right;   CARPAL TUNNEL RELEASE Left 09/09/2012   Procedure: CARPAL TUNNEL RELEASE;  Surgeon: Kemp Patter, MD;  Location: [facility];  Service: Orthopedics;  Laterality: Left;   COLONOSCOPY     COLONOSCOPY W/ BIOPSIES AND POLYPECTOMY     PARTIAL COLECTOMY  2006   sigmoid   STERNOTOMY  2006   medial-thymectomy   UPPER GASTROINTESTINAL ENDOSCOPY  2006     Home Medications:  Prior to Admission medications   Medication Sig Start Date End Date Taking? Authorizing Provider  cholecalciferol (VITAMIN D3) 25 MCG (1000 UNIT) tablet Take 3,000 Units by mouth daily.   Yes [provider]  clonazePAM  (KLONOPIN ) 0.5 MG tablet Take 0.5 mg by mouth 2 (two) times daily as needed.   Yes [provider]  gabapentin  (NEURONTIN ) 300 MG capsule Take 300 mg by mouth at bedtime.  Yes [provider]  Melatonin 10 MG TABS Take 20 mg by mouth at bedtime as needed (Sleep).   Yes [provider]  tamsulosin  (FLOMAX ) 0.4 MG CAPS capsule Take 0.4 mg by mouth daily.   Yes [provider]  TOUJEO  SOLOSTAR 300 UNIT/ML Solostar Pen Inject 22 Units into the skin in the morning.   Yes [provider]  amoxicillin (AMOXIL) 875 MG tablet Take 875 mg by mouth 2 (two) times daily. Patient not taking: Reported on 05/26/2023 04/22/23   [provider]  aspirin 81 MG tablet Take 81 mg by mouth  daily. Patient not taking: Reported on 05/26/2023    [provider]  baclofen  (LIORESAL ) 10 MG tablet TAKE 1 TABLET BY MOUTH THREE TIMES A DAY Patient not taking: Reported on 05/26/2023 11/05/22   Jonathan, Megan E, NP  Insulin  Aspart FlexPen (NOVOLOG ) 100 UNIT/ML USE AS DIRECTED. MAX DAILY DOSE IS 50 UNITS. Patient not taking: Reported on 05/25/2023 08/01/20   [provider]  LORazepam  (ATIVAN ) 1 MG tablet Take 0.5 mg by mouth at bedtime as needed.  Patient not taking: Reported on 05/26/2023    [provider]  traMADol  (ULTRAM ) 50 MG tablet Take 1 tablet (50 mg total) by mouth every 8 (eight) hours as needed for moderate pain or severe pain. Patient not taking: Reported on 05/26/2023 09/24/22 09/24/23  Jonathan, Megan E, NP  zolpidem (AMBIEN) 5 MG tablet Take 5 mg by mouth at bedtime as needed. Patient not taking: Reported on 05/26/2023 08/16/22   [provider]    Inpatient Medications: Scheduled Meds:  insulin  aspart  0-15 Units Subcutaneous TID WC   insulin  aspart  0-5 Units Subcutaneous QHS   melatonin  5 mg Oral QHS   metoprolol  tartrate  25 mg Oral BID   tamsulosin   0.4 mg Oral Daily   Continuous Infusions:  ceFEPime  (MAXIPIME ) IV 2 g (05/27/23 0457)   diltiazem  (CARDIZEM ) infusion 7.5 mg/hr (05/27/23 1037)   heparin  1,200 Units/hr (05/27/23 0814)   lactated ringers  20 mL/hr at 05/27/23 0217   lactated ringers  75 mL/hr at 05/27/23 1007   metronidazole  500 mg (05/27/23 1017)   vancomycin      PRN Meds: acetaminophen  **OR** acetaminophen , clonazePAM , ondansetron  **OR** ondansetron  (ZOFRAN ) IV  Allergies:    Allergies  Allergen Reactions   Tape Other (See Comments)    Unknown    Social History:   Social History   Socioeconomic History   Marital status: Widowed    Spouse name: Not on file   Number of children: Not on file   Years of education: Not on file   Highest education level: Not on file  Occupational History   Not on file  Tobacco Use    Smoking status: Never   Smokeless tobacco: Never  Substance and Sexual Activity   Alcohol use: No   Drug use: No   Sexual activity: Not on file  Other Topics Concern   Not on file  Social History Narrative   Not on file   Social Drivers of Health   Financial Resource Strain: Not on file  Food Insecurity: No Food Insecurity (05/26/2023)   Hunger Vital Sign    Worried About Running Out of Food in the Last Year: Never true    Ran Out of Food in the Last Year: Never true  Transportation Needs: No Transportation Needs (05/26/2023)   PRAPARE - Administrator, Civil Service (Medical): No    Lack of Transportation (Non-Medical):  No  Physical Activity: Not on file  Stress: Not on file  Social Connections: Unknown (05/26/2023)   Social Connection and Isolation Panel [NHANES]    Frequency of Communication with Friends and Family: Once a week    Frequency of Social Gatherings with Friends and Family: Once a week    Attends Religious Services: Patient unable to answer    Active Member of Clubs or Organizations: Patient unable to answer    Attends Banker Meetings: Patient unable to answer    Marital Status: Widowed  Intimate Partner Violence: Not At Risk (05/26/2023)   Humiliation, Afraid, Rape, and Kick questionnaire    Fear of Current or Ex-Partner: No    Emotionally Abused: No    Physically Abused: No    Sexually Abused: No    Family History:   Unable to obtain as he is lethargic   ROS:  Unable to obtain as he is lethargic      Physical Exam/Data:   Vitals:   05/27/23 0800 05/27/23 0815 05/27/23 1004 05/27/23 1036  BP: 98/62 101/63 114/72 (!) 111/93  Pulse: (!) 121 (!) 121 (!) 117 (!) 127  Resp: (!) 24 (!) 22  (!) 26  Temp:  98.8 F (37.1 C)    TempSrc:  Oral    SpO2:  100%  100%  Weight:      Height:        Intake/Output Summary (Last 24 hours) at 05/27/2023 1042 Last data filed at 05/27/2023 0600 Gross per 24 hour  Intake 3511.42 ml  Output  1100 ml  Net 2411.42 ml      05/26/2023   12:00 AM 11/25/2012   12:19 PM 11/13/2012    3:30 PM  Last 3 Weights  Weight (lbs) 205 lb 7.5 oz 206 lb 9.6 oz 205 lb 12.8 oz  Weight (kg) 93.2 kg 93.713 kg 93.35 kg     Body mass index is 32.67 kg/m.   Vitals:  Vitals:   05/27/23 1004 05/27/23 1036  BP: 114/72 (!) 111/93  Pulse: (!) 117 (!) 127  Resp:  (!) 26  Temp:    SpO2:  100%   General Appearance: In no apparent distress, laying in bed,lethargic  HEENT: Normocephalic, atraumatic.  Neck: Supple, trachea midline, no JVDs Cardiovascular: Irregularly irregular, S1S2, no murmur  Respiratory: Resting breathing unlabored, lungs sounds diminished at base to auscultation bilaterally, no use of accessory muscles. On 2L [place] oxygen   Gastrointestinal: Bowel sounds positive, facial grimacing noted RUQ abdomen palpation Extremities: Able to move all extremities in bed without difficulty, no edema of BLE Musculoskeletal: Mild muscular atrophy to age  Skin: Intact, warm, dry. No rashes  Neurologic:Lethargic, oriented to self, answered yes and no question inconsistently  Psychiatric: calm        EKG:  The EKG was personally reviewed and demonstrates:    EKG from 05/25/23 showed sinus tachycardia 129 bpm  EKG from 05/26/23 0025 likely A fib RVR 161 bpm, artifacts limiting review   EKG from 05/26/23 0849 showed sinus tachycardia 103 bpm, PACs   Telemetry:  Telemetry was personally reviewed and demonstrates:    A fib RVR up to 150s this morning , transition from sinus tachycardia up to 130s, PACs and PVCs   Relevant CV Studies:   Echo from yesterday showed :  1. Left ventricular ejection fraction, by estimation, is 60 to 65%. The  left ventricle has normal function. Left ventricular endocardial border  not optimally defined to evaluate regional wall motion.  Left ventricular  diastolic parameters are consistent  with Grade I diastolic dysfunction (impaired relaxation).   2. Right  ventricular systolic function was not well visualized. The right  ventricular size is normal.   3. Left atrial size was moderately dilated.   4. The mitral valve is grossly normal. No evidence of mitral valve  regurgitation. No evidence of mitral stenosis.   5. The aortic valve was not well visualized. There is moderate  calcification of the aortic valve. Aortic valve regurgitation is not  visualized. Aortic valve sclerosis/calcification is present, without any  evidence of aortic stenosis. Aortic valve area,  by VTI measures 2.09 cm. Aortic valve mean gradient measures 11.0 mmHg.  Aortic valve Vmax measures 1.92 m/s.   6. Aortic dilatation noted. There is mild dilatation of the aortic root,  measuring 42 mm. Normal size of ascending aorta.   Comparison(s): No prior Echocardiogram.     Laboratory Data:  High Sensitivity Troponin:  No results for input(s): "TROPONINIHS" in the last 720 hours.   Chemistry Recent Labs  Lab 05/25/23 2130 05/26/23 0500 05/27/23 0512  NA 126* 127* 128*  K 4.5 4.0 4.0  CL 95* 97* 98  CO2 20* 22 21*  GLUCOSE 266* 324* 148*  BUN 14 11 12   CREATININE 0.81 0.74 0.68  CALCIUM  9.0 8.5* 8.2*  GFRNONAA >60 >60 >60  ANIONGAP 11 8 9     Recent Labs  Lab 05/25/23 2130 05/26/23 0500 05/27/23 0512  PROT 6.8 5.8* 5.5*  ALBUMIN 3.2* 2.6* 2.4*  AST 17 17 50*  ALT 12 12 25   ALKPHOS 58 54 55  BILITOT 2.1* 1.8* 1.1   Lipids No results for input(s): "CHOL", "TRIG", "HDL", "LABVLDL", "LDLCALC", "CHOLHDL" in the last 168 hours.  Hematology Recent Labs  Lab 05/25/23 2042 05/26/23 0500 05/27/23 0512  WBC 16.3* 18.0* 12.3*  RBC 4.11* 3.89* 3.62*  HGB 12.3* 11.8* 10.6*  HCT 37.1* 35.2* 32.9*  MCV 90.3 90.5 90.9  MCH 29.9 30.3 29.3  MCHC 33.2 33.5 32.2  RDW 13.0 12.8 13.2  PLT 269 259 176   Thyroid  No results for input(s): "TSH", "FREET4" in the last 168 hours.  BNPNo results for input(s): "BNP", "PROBNP" in the last 168 hours.  DDimer No results  for input(s): "DDIMER" in the last 168 hours.   Radiology/Studies:  CT ABDOMEN PELVIS W CONTRAST Result Date: 05/26/2023 CLINICAL DATA:  Sepsis . Pt was very agitated and fidgety through the entire exam. Best images attainable. EXAM: CT ABDOMEN AND PELVIS WITH CONTRAST TECHNIQUE: Multidetector CT imaging of the abdomen and pelvis was performed using the standard protocol following bolus administration of intravenous contrast. RADIATION DOSE REDUCTION: This exam was performed according to the departmental dose-optimization program which includes automated exposure control, adjustment of the mA and/or kV according to patient size and/or use of iterative reconstruction technique. CONTRAST:  OMNIPAQUE  IOHEXOL  300 MG/ML  SOLN COMPARISON:  X-ray lumbar 03/19/2022, CT abdomen pelvis 11/10/2007 FINDINGS: Lower chest: Trace right pleural effusion. Hepatobiliary: Limited evaluation due to motion artifact with gallbladder wall thickening and pericholecystic fat stranding. Similar-appearing hypodensity of the left hepatic lobe likely a simple hepatic cyst (2:22). No CT evidence of gallstones. No pericholecystic fluid. No biliary dilatation. Pancreas: No focal lesion. Normal pancreatic contour. No surrounding inflammatory changes. No main pancreatic ductal dilatation. Spleen: Normal in size without focal abnormality. Adrenals/Urinary Tract: No adrenal nodule bilaterally. Bilateral kidneys enhance symmetrically. Parapelvic simple cysts of the left kidney. Simple renal cysts, in the absence of clinically indicated  signs/symptoms, require no independent follow-up. No hydronephrosis. No hydroureter.  No nephroureterolithiasis. The urinary bladder is unremarkable. Minimal excretion of intravenous contrast of either kidneys on delayed view. Stomach/Bowel: Stomach is within normal limits. No evidence of bowel wall thickening or dilatation. Third portion of the duodenum diverticula. Appendix appears normal. Vascular/Lymphatic:  No abdominal aorta or iliac aneurysm. Severe atherosclerotic plaque of the aorta and its branches. No abdominal, pelvic, or inguinal lymphadenopathy. Reproductive: Prostate is unremarkable. Other: No intraperitoneal free fluid. No intraperitoneal free gas. No organized fluid collection. Musculoskeletal: No abdominal wall hernia or abnormality. No suspicious lytic or blastic osseous lesions. No acute displaced fracture. Chronic severe L1 compression fracture. Multilevel severe degenerative changes of the spine. Partially visualized left femur intramedullary nail fixation. IMPRESSION: 1. Gallbladder wall thickening and pericholecystic fat stranding. Question acute cholecystitis. Finding better evaluated on ultrasound abdomen 05/26/2023. 2. Trace right pleural effusion. 3. Third portion of the duodenum diverticula. Electronically Signed   By: Morgane  Naveau M.D.   On: 05/26/2023 22:24   US  Abdomen Limited RUQ (LIVER/GB) Result Date: 05/26/2023 CLINICAL DATA:  Fever.  Abnormal gallbladder on chest CT. EXAM: ULTRASOUND ABDOMEN LIMITED RIGHT UPPER QUADRANT COMPARISON:  CT chest from same day. CT abdomen pelvis dated November 10, 2007. FINDINGS: Gallbladder: Distended with sludge. Moderate wall thickening. No gallstones. No sonographic Murphy sign noted by sonographer. Common bile duct: Not visualized, but nondilated on earlier CT. Liver: No focal lesion identified. Within normal limits in parenchymal echogenicity. Portal vein is patent on color Doppler imaging with normal direction of blood flow towards the liver. Other: None. IMPRESSION: 1. Distended gallbladder with sludge and moderate wall thickening. No gallstones. Findings are equivocal for acute cholecystitis. Consider further evaluation with nuclear medicine hepatobiliary scan. Electronically Signed   By: Aleta Anda M.D.   On: 05/26/2023 15:30   CT CHEST WO CONTRAST Result Date: 05/26/2023 CLINICAL DATA:  Shortness of breath. Cold symptoms for the past few  weeks. EXAM: CT CHEST WITHOUT CONTRAST TECHNIQUE: Multidetector CT imaging of the chest was performed following the standard protocol without IV contrast. RADIATION DOSE REDUCTION: This exam was performed according to the departmental dose-optimization program which includes automated exposure control, adjustment of the mA and/or kV according to patient size and/or use of iterative reconstruction technique. COMPARISON:  Chest x-ray from same day. CT chest dated February 04, 2021. FINDINGS: Cardiovascular: No significant vascular findings. Normal heart size. No pericardial effusion. No thoracic aortic aneurysm. Coronary, aortic arch, and branch vessel atherosclerotic vascular disease. Mediastinum/Nodes: No enlarged mediastinal or axillary lymph nodes. Thyroid  gland, trachea, and esophagus demonstrate no significant findings. Lungs/Pleura: Bibasilar subsegmental atelectasis. No focal consolidation, pleural effusion, or pneumothorax. Upper Abdomen: Distended gallbladder with mild wall thickening and surrounding inflammatory change. Musculoskeletal: No chest wall mass or suspicious bone lesions identified. IMPRESSION: 1. No acute intrathoracic process. 2. Distended gallbladder with mild wall thickening and surrounding inflammatory change, concerning for acute cholecystitis. Correlate with right upper quadrant pain and consider further evaluation with right upper quadrant ultrasound. 3.  Aortic Atherosclerosis (ICD10-I70.0). Electronically Signed   By: Aleta Anda M.D.   On: 05/26/2023 13:29   ECHOCARDIOGRAM COMPLETE Result Date: 05/26/2023    ECHOCARDIOGRAM REPORT   Patient Name:   LOVELLE WESTLING Craker Date of Exam: 05/26/2023 Medical Rec #:  [id number]         Height:       66.5 in Accession #:    [id number]        Weight:       205.5  lb Date of Birth:  02/28/1928          BSA:          2.034 m Patient Age:    89 years          BP:           136/87 mmHg Patient Gender: M                 HR:           100 bpm. Exam  Location:  Inpatient Procedure: 2D Echo, Cardiac Doppler and Color Doppler Indications:    Arrhythmia  History:        Patient has no prior history of Echocardiogram examinations.                 Risk Factors:Hypertension and Diabetes.  Sonographer:    Astrid Blamer Referring Phys: [id number] MOHAMMAD L GARBA IMPRESSIONS  1. Left ventricular ejection fraction, by estimation, is 60 to 65%. The left ventricle has normal function. Left ventricular endocardial border not optimally defined to evaluate regional wall motion. Left ventricular diastolic parameters are consistent with Grade I diastolic dysfunction (impaired relaxation).  2. Right ventricular systolic function was not well visualized. The right ventricular size is normal.  3. Left atrial size was moderately dilated.  4. The mitral valve is grossly normal. No evidence of mitral valve regurgitation. No evidence of mitral stenosis.  5. The aortic valve was not well visualized. There is moderate calcification of the aortic valve. Aortic valve regurgitation is not visualized. Aortic valve sclerosis/calcification is present, without any evidence of aortic stenosis. Aortic valve area, by VTI measures 2.09 cm. Aortic valve mean gradient measures 11.0 mmHg. Aortic valve Vmax measures 1.92 m/s.  6. Aortic dilatation noted. There is mild dilatation of the aortic root, measuring 42 mm. Normal size of ascending aorta. Comparison(s): No prior Echocardiogram. FINDINGS  Left Ventricle: Left ventricular ejection fraction, by estimation, is 60 to 65%. The left ventricle has normal function. Left ventricular endocardial border not optimally defined to evaluate regional wall motion. The left ventricular internal cavity size was normal in size. There is no left ventricular hypertrophy. Left ventricular diastolic parameters are consistent with Grade I diastolic dysfunction (impaired relaxation). Normal left ventricular filling pressure. Right Ventricle: The right ventricular size is  normal. No increase in right ventricular wall thickness. Right ventricular systolic function was not well visualized. Left Atrium: Left atrial size was moderately dilated. Right Atrium: Right atrial size was normal in size. Pericardium: There is no evidence of pericardial effusion. Mitral Valve: The mitral valve is grossly normal. No evidence of mitral valve regurgitation. No evidence of mitral valve stenosis. Tricuspid Valve: The tricuspid valve is not well visualized. Tricuspid valve regurgitation is trivial. No evidence of tricuspid stenosis. Aortic Valve: The aortic valve was not well visualized. There is moderate calcification of the aortic valve. Aortic valve regurgitation is not visualized. Aortic valve sclerosis/calcification is present, without any evidence of aortic stenosis. Aortic valve mean gradient measures 11.0 mmHg. Aortic valve peak gradient measures 14.7 mmHg. Aortic valve area, by VTI measures 2.09 cm. Pulmonic Valve: The pulmonic valve was not well visualized. Pulmonic valve regurgitation is trivial. No evidence of pulmonic stenosis. Aorta: Aortic dilatation noted. There is mild dilatation of the aortic root, measuring 42 mm. Venous: The inferior vena cava was not well visualized. IAS/Shunts: The interatrial septum was not well visualized.  LEFT VENTRICLE PLAX 2D LVIDd:         4.90 cm  Diastology LVIDs:         3.60 cm   LV Owen' medial:    8.49 cm/s LV PW:         1.00 cm   LV Owen/Owen' medial:  9.8 LV IVS:        1.00 cm   LV Owen' lateral:   10.10 cm/s LVOT diam:     2.00 cm   LV Owen/Owen' lateral: 8.3 LV SV:         74 LV SV Index:   37 LVOT Area:     3.14 cm  RIGHT VENTRICLE RV S prime:     23.00 cm/s TAPSE (M-mode): 2.8 cm LEFT ATRIUM             Index LA Vol (A2C):   45.8 ml 22.51 ml/m LA Vol (A4C):   88.1 ml 43.31 ml/m LA Biplane Vol: 64.3 ml 31.61 ml/m  AORTIC VALVE AV Area (Vmax):    1.85 cm AV Area (Vmean):   1.73 cm AV Area (VTI):     2.09 cm AV Vmax:           192.00 cm/s AV Vmean:           164.000 cm/s AV VTI:            0.356 m AV Peak Grad:      14.7 mmHg AV Mean Grad:      11.0 mmHg LVOT Vmax:         113.00 cm/s LVOT Vmean:        90.400 cm/s LVOT VTI:          0.237 m LVOT/AV VTI ratio: 0.67  AORTA Ao Root diam: 4.20 cm MITRAL VALVE               TRICUSPID VALVE MV Area (PHT): 3.27 cm    TR Peak grad:   35.0 mmHg MV Decel Time: 232 msec    TR Vmax:        296.00 cm/s MV Owen velocity: 83.60 cm/s MV A velocity: 84.00 cm/s  SHUNTS MV Owen/A ratio:  1.00        Systemic VTI:  0.24 m                            Systemic Diam: 2.00 cm Vishnu Priya Mallipeddi Electronically signed by Lucetta Russel Mallipeddi Signature Date/Time: 05/26/2023/11:18:32 AM    Final    DG Chest Portable 1 View Result Date: 05/26/2023 CLINICAL DATA:  Increased heart rate and shortness of breath. EXAM: PORTABLE CHEST 1 VIEW COMPARISON:  May 25, 2023 FINDINGS: Multiple sternal wires are noted. Cardiac silhouette is mildly enlarged and unchanged in size. Low lung volumes are seen. Mild atelectasis is present within the bilateral lung bases. No pleural effusion or pneumothorax is identified. Multilevel degenerative changes are seen throughout the thoracic spine. IMPRESSION: 1. Evidence of prior median sternotomy. 2. Low lung volumes with mild bibasilar atelectasis. Electronically Signed   By: Virgle Grime M.D.   On: 05/26/2023 01:14   DG Chest Portable 1 View Result Date: 05/25/2023 CLINICAL DATA:  Cough, sepsis EXAM: PORTABLE CHEST 1 VIEW COMPARISON:  07/18/2021 FINDINGS: Single frontal view of the chest demonstrates stable postsurgical changes from median sternotomy. The cardiac silhouette is enlarged. Lung volumes are diminished, with crowding of the central vasculature. No airspace disease, effusion, or pneumothorax. IMPRESSION: 1. Low lung volumes.  No acute airspace disease. Electronically Signed   By:  Bobbye Burrow M.D.   On: 05/25/2023 21:52     Assessment and Plan:    Paroxysmal A fib RVR - appears new  onset, in the setting of sepsis 2/2 possible acute cholecystitis  - telemetry this AM showed recurrence of A fib RVR up to 150s - ok to start cardizem  gtt, wean to <100 bpm, will start PO metoprolol  25mg  BID, would not aggressive rate control until underlying infection is adequate treated  - check TSH and Mag, keep Mag >2 and K >4  - Echo limited review, EF preserved  - CHA2DS2-VASc Score = 5 , started on heparin  gtt by primary team, would monitor for tolerance and re-assess bleeding risk when patient is less lethargic to make final recommendation    Sepsis 2/2 possible acute cholecystitis  Acute metabolic encephalopathy  Type 2 DM HTN Hyponatremia  -per primary team      Risk Assessment/Risk Scores:   CHA2DS2-VASc Score = 5  This indicates a 7.2% annual risk of stroke. The patient's score is based upon: CHF History: 0 HTN History: 1 Diabetes History: 1 Stroke History: 0 Vascular Disease History: 1 Age Score: 2 Gender Score: 0  For questions or updates, please contact [facility] Please consult [email or web address] for contact info under  Signed, Xika Zhao, NP  05/27/2023 10:42 AM  Patient seen and examined, note reviewed with the signed Advanced Practice Provider. I personally reviewed laboratory data, imaging studies and relevant notes. I independently examined the patient and formulated the important aspects of the plan. I have personally discussed the plan with the patient and/or family. Comments or changes to the note/plan are indicated below.  Patient seen examined by his bedside.  His niece was by the bedside when I arrived.  Daughter Edwina Gram is likely plan to come from [place]  today.  89 year old male with sepsis and A-fib RVR.  Paroxysmal atrial fibrillation heart rate appears to be controlled.  He is currently on Cardizem  drip as well as heparin  drip for stroke prevention.  Agree with adding metoprolol .  Hopefully this can keep his heart rate in the lower side.  By  bedside and on telemetry he is fluctuating between 90s and low 100s. We will be lenient giving his A-fib RVR is being triggered by his underlying infection.  Expect this to improve once his underlying infection improves   Dutchess Crosland DO, MS [facility] Attending Cardiologist [facility]  [address] [place], [place] [address] [phone] Website: [email or web address]

## 2023-05-27 NOTE — Consult Note (Signed)
 Jonathan Owen [facility] 10/29/1927  [id number].    Requesting MD: Dr. Leona Rake  Chief Complaint/Reason for Consult: Possible Acute Cholecystitis   HPI: Jonathan Owen is a 89 y.o. male with a history of diabetes and hypertension who presented to the ED from home with fever, chills, and shortness of breath.   Patient seen with RN.  RN reports patient with waxing waning mental status today.  He is currently A&O x 3 but was reported to be A&O x 1 earlier.  He currently reports shortness of breath and a dry cough.  No abdominal pain, nausea, vomiting or urinary symptoms.  No reported change in bowel habits.  He underwent workup that was concerning for acute cholecystitis.  No other clear sources of infection.  Also noted to have new A-fib with RVR.  Currently on heparin  and Cardizem  drip.  Prior Abdominal Surgeries: Hx of partial colectomy for colon ca  ROS: ROS As above, limited 2/2 mental status.   History reviewed. No pertinent family history.  Past Medical History:  Diagnosis Date   Arthritis    Cancer (HCC)    hx colon cancer   Diabetes mellitus without complication (HCC)    no meds-watches diet   Hypertension    no meds now-   Wears dentures    upper-partial bottom   Wears glasses     Past Surgical History:  Procedure Laterality Date   CARPAL TUNNEL RELEASE  03/04/2012   Procedure: CARPAL TUNNEL RELEASE;  Surgeon: Kemp Patter, MD;  Location: [facility];  Service: Orthopedics;  Laterality: Right;   CARPAL TUNNEL RELEASE Left 09/09/2012   Procedure: CARPAL TUNNEL RELEASE;  Surgeon: Kemp Patter, MD;  Location:  SURGERY CENTER;  Service: Orthopedics;  Laterality: Left;   COLONOSCOPY     COLONOSCOPY W/ BIOPSIES AND POLYPECTOMY     PARTIAL COLECTOMY  2006   sigmoid   STERNOTOMY  2006   medial-thymectomy   UPPER GASTROINTESTINAL ENDOSCOPY  2006    Social History:  reports that he has never smoked. He has never used smokeless tobacco. He  reports that he does not drink alcohol and does not use drugs.  Allergies:  Allergies  Allergen Reactions   Tape Other (See Comments)    Unknown    Medications Prior to Admission  Medication Sig Dispense Refill   cholecalciferol (VITAMIN D3) 25 MCG (1000 UNIT) tablet Take 3,000 Units by mouth daily.     clonazePAM  (KLONOPIN ) 0.5 MG tablet Take 0.5 mg by mouth 2 (two) times daily as needed.     gabapentin  (NEURONTIN ) 300 MG capsule Take 300 mg by mouth at bedtime.     Melatonin 10 MG TABS Take 20 mg by mouth at bedtime as needed (Sleep).     tamsulosin  (FLOMAX ) 0.4 MG CAPS capsule Take 0.4 mg by mouth daily.     TOUJEO  SOLOSTAR 300 UNIT/ML Solostar Pen Inject 22 Units into the skin in the morning.     amoxicillin (AMOXIL) 875 MG tablet Take 875 mg by mouth 2 (two) times daily. (Patient not taking: Reported on 05/26/2023)     aspirin 81 MG tablet Take 81 mg by mouth daily. (Patient not taking: Reported on 05/26/2023)     baclofen  (LIORESAL ) 10 MG tablet TAKE 1 TABLET BY MOUTH THREE TIMES A DAY (Patient not taking: Reported on 05/26/2023) 30 tablet 0   Insulin  Aspart FlexPen (NOVOLOG ) 100 UNIT/ML USE AS DIRECTED. MAX DAILY DOSE IS 50 UNITS. (Patient not  taking: Reported on 05/25/2023)     LORazepam  (ATIVAN ) 1 MG tablet Take 0.5 mg by mouth at bedtime as needed.  (Patient not taking: Reported on 05/26/2023)     traMADol  (ULTRAM ) 50 MG tablet Take 1 tablet (50 mg total) by mouth every 8 (eight) hours as needed for moderate pain or severe pain. (Patient not taking: Reported on 05/26/2023) 20 tablet 0   zolpidem (AMBIEN) 5 MG tablet Take 5 mg by mouth at bedtime as needed. (Patient not taking: Reported on 05/26/2023)       Physical Exam: Blood pressure 131/70, pulse (!) 111, temperature 98.1 F (36.7 C), temperature source Oral, resp. rate (!) 22, height 5\' 11"  (1.803 m), weight 97.6 kg, SpO2 98%. General: pleasant, elderly male who is laying in bed in NAD HEENT: head is normocephalic, atraumatic.    Heart: Irr, Irr Lungs: CTA b/l, on o2 Abd:  Soft, possible mild distension, epigastric and ruq ttp with deep palpation. No masses, hernias, or organomegaly Skin: warm and dry  Psych: A&Ox3 with an appropriate affect Neuro: able speech, moves all extremities, gait not assessed  Results for orders placed or performed during the hospital encounter of 05/25/23 (from the past 48 hours)  CBC with Differential     Status: Abnormal   Collection Time: 05/25/23  8:42 PM  Result Value Ref Range   WBC 16.3 (H) 4.0 - 10.5 K/uL   RBC 4.11 (L) 4.22 - 5.81 MIL/uL   Hemoglobin 12.3 (L) 13.0 - 17.0 g/dL   HCT 74.2 (L) 59.5 - 63.8 %   MCV 90.3 80.0 - 100.0 fL   MCH 29.9 26.0 - 34.0 pg   MCHC 33.2 30.0 - 36.0 g/dL   RDW 75.6 43.3 - 29.5 %   Platelets 269 150 - 400 K/uL   nRBC 0.0 0.0 - 0.2 %   Neutrophils Relative % 88 %   Neutro Abs 14.4 (H) 1.7 - 7.7 K/uL   Lymphocytes Relative 4 %   Lymphs Abs 0.6 (L) 0.7 - 4.0 K/uL   Monocytes Relative 7 %   Monocytes Absolute 1.2 (H) 0.1 - 1.0 K/uL   Eosinophils Relative 0 %   Eosinophils Absolute 0.0 0.0 - 0.5 K/uL   Basophils Relative 0 %   Basophils Absolute 0.0 0.0 - 0.1 K/uL   Immature Granulocytes 1 %   Abs Immature Granulocytes 0.14 (H) 0.00 - 0.07 K/uL    Comment: Performed at [facility], [address] [place], [place] [address]  Culture, blood (Routine x 2)     Status: None (Preliminary result)   Collection Time: 05/25/23  8:43 PM   Specimen: BLOOD  Result Value Ref Range   Specimen Description      BLOOD BLOOD LEFT ARM Performed at [facility], [address], [place], [place] [address]    Special Requests      Blood Culture results may not be optimal due to an inadequate volume of blood received in culture bottles BOTTLES DRAWN AEROBIC AND ANAEROBIC Performed at [facility], [address], [place], [place] [address]    Culture      NO GROWTH < 12 HOURS Performed at [facility], [address], [place], [place] [address]    Report Status PENDING   Culture, blood (Routine x 2)     Status: None (Preliminary result)   Collection Time: 05/25/23  8:45 PM   Specimen: BLOOD  Result Value Ref Range   Specimen  Description      BLOOD BLOOD RIGHT FOREARM Performed at [facility], [address], [place], [place] [address]    Special Requests      Blood Culture results may not be optimal due to an inadequate volume of blood received in culture bottles BOTTLES DRAWN AEROBIC AND ANAEROBIC Performed at [facility], [address], [place], [place] [address]    Culture      NO GROWTH < 12 HOURS Performed at [facility], [address], [place], [place] [address]    Report Status PENDING   I-Stat Lactic Acid, ED     Status: Abnormal   Collection Time: 05/25/23  8:54 PM  Result Value Ref Range   Lactic Acid, Venous 2.2 (HH) 0.5 - 1.9 mmol/L  Protime-INR     Status: None   Collection Time: 05/25/23  8:55 PM  Result Value Ref Range   Prothrombin Time 15.2 11.4 - 15.2 seconds   INR 1.2 0.8 - 1.2    Comment: (NOTE) INR goal varies based on device and disease states. Performed at [facility], [address] [place], [place] [address]   APTT     Status: None   Collection Time: 05/25/23  8:55 PM  Result Value Ref Range   aPTT 25 24 - 36 seconds    Comment: Performed at [facility], [address], [place], [place] [address]  Resp panel by RT-PCR (RSV, Flu A&B, Covid)     Status: None   Collection Time: 05/25/23  9:05 PM  Result Value Ref Range   SARS Coronavirus 2 by RT PCR NEGATIVE NEGATIVE    Comment: (NOTE) SARS-CoV-2 target nucleic acids are NOT DETECTED.  The SARS-CoV-2 RNA is generally detectable in upper respiratory specimens during the acute phase of infection. The lowest concentration of SARS-CoV-2 viral copies this assay can detect is 138 copies/mL. A  negative result does not preclude SARS-Cov-2 infection and should not be used as the sole basis for treatment or other patient management decisions. A negative result may occur with  improper specimen collection/handling, submission of specimen other than nasopharyngeal swab, presence of viral mutation(s) within the areas targeted by this assay, and inadequate number of viral copies(<138 copies/mL). A negative result must be combined with clinical observations, patient history, and epidemiological information. The expected result is Negative.  Fact Sheet for Patients:  BloggerCourse.com  Fact Sheet for Healthcare Providers:  SeriousBroker.it  This test is no t yet approved or cleared by the [place]  FDA and  has been authorized for detection and/or diagnosis of SARS-CoV-2 by FDA under an Emergency Use Authorization (EUA). This EUA will remain  in effect (meaning this test can be used) for the duration of the COVID-19 declaration under Section 564(b)(1) of the Act, 21 U.S.C.section 360bbb-3(b)(1), unless the authorization is terminated  or revoked sooner.       Influenza A by PCR NEGATIVE NEGATIVE   Influenza B by PCR NEGATIVE NEGATIVE    Comment: (NOTE) The Xpert Xpress SARS-CoV-2/FLU/RSV plus assay is intended as an aid in the diagnosis of influenza from Nasopharyngeal swab specimens and should not be used as a sole basis for treatment. Nasal washings and aspirates are unacceptable for Xpert Xpress SARS-CoV-2/FLU/RSV testing.  Fact Sheet for Patients: BloggerCourse.com  Fact Sheet for Healthcare Providers: SeriousBroker.it  This test is not yet approved or cleared by the [place]  FDA and has been authorized for detection and/or diagnosis of SARS-CoV-2  by FDA under an Emergency Use Authorization (EUA). This EUA will remain in effect (meaning this test can be used)  for the duration of the COVID-19 declaration under Section 564(b)(1) of the Act, 21 U.S.C. section 360bbb-3(b)(1), unless the authorization is terminated or revoked.     Resp Syncytial Virus by PCR NEGATIVE NEGATIVE    Comment: (NOTE) Fact Sheet for Patients: BloggerCourse.com  Fact Sheet for Healthcare Providers: SeriousBroker.it  This test is not yet approved or cleared by the [place]  FDA and has been authorized for detection and/or diagnosis of SARS-CoV-2 by FDA under an Emergency Use Authorization (EUA). This EUA will remain in effect (meaning this test can be used) for the duration of the COVID-19 declaration under Section 564(b)(1) of the Act, 21 U.S.C. section 360bbb-3(b)(1), unless the authorization is terminated or revoked.  Performed at [facility] [address], [place], [place] [address]   Comprehensive metabolic panel     Status: Abnormal   Collection Time: 05/25/23  9:30 PM  Result Value Ref Range   Sodium 126 (L) 135 - 145 mmol/L   Potassium 4.5 3.5 - 5.1 mmol/L   Chloride 95 (L) 98 - 111 mmol/L   CO2 20 (L) 22 - 32 mmol/L   Glucose, Bld 266 (H) 70 - 99 mg/dL    Comment: Glucose reference range applies only to samples taken after fasting for at least 8 hours.   BUN 14 8 - 23 mg/dL   Creatinine, Ser 7.84 0.61 - 1.24 mg/dL   Calcium  9.0 8.9 - 10.3 mg/dL   Total Protein 6.8 6.5 - 8.1 g/dL   Albumin 3.2 (L) 3.5 - 5.0 g/dL   AST 17 15 - 41 U/L   ALT 12 0 - 44 U/L   Alkaline Phosphatase 58 38 - 126 U/L   Total Bilirubin 2.1 (H) 0.0 - 1.2 mg/dL   GFR, Estimated >69 >62 mL/min    Comment: (NOTE) Calculated using the CKD-EPI Creatinine Equation (2021)    Anion gap 11 5 - 15    Comment: Performed at [facility], [address], [place], [place] [address]  CBG monitoring, ED     Status: Abnormal   Collection Time: 05/25/23  9:55 PM  Result Value Ref Range    Glucose-Capillary 253 (H) 70 - 99 mg/dL    Comment: Glucose reference range applies only to samples taken after fasting for at least 8 hours.  I-Stat Lactic Acid, ED     Status: Abnormal   Collection Time: 05/25/23 10:33 PM  Result Value Ref Range   Lactic Acid, Venous 2.0 (HH) 0.5 - 1.9 mmol/L  Urinalysis, w/ Reflex to Culture (Infection Suspected) -Urine, Clean Catch     Status: Abnormal   Collection Time: 05/25/23 11:41 PM  Result Value Ref Range   Specimen Source URINE, CATHETERIZED    Color, Urine YELLOW YELLOW   APPearance CLEAR CLEAR   Specific Gravity, Urine 1.016 1.005 - 1.030   pH 6.0 5.0 - 8.0   Glucose, UA 150 (A) NEGATIVE mg/dL   Hgb urine dipstick NEGATIVE NEGATIVE   Bilirubin Urine NEGATIVE NEGATIVE   Ketones, ur 5 (A) NEGATIVE mg/dL   Protein, ur NEGATIVE NEGATIVE mg/dL   Nitrite NEGATIVE NEGATIVE   Leukocytes,Ua NEGATIVE NEGATIVE   RBC / HPF 0-5 0 - 5 RBC/hpf   WBC, UA 0-5 0 - 5 WBC/hpf    Comment:        Reflex urine culture not performed if WBC <=10, OR  if Squamous epithelial cells >5. If Squamous epithelial cells >5 suggest recollection.    Bacteria, UA NONE SEEN NONE SEEN   Squamous Epithelial / HPF 0-5 0 - 5 /HPF    Comment: Performed at [facility], [address], [place], [place] [address]  Hemoglobin A1c     Status: Abnormal   Collection Time: 05/26/23  4:30 AM  Result Value Ref Range   Hgb A1c MFr Bld 6.1 (H) 4.8 - 5.6 %    Comment: (NOTE) Pre diabetes:          5.7%-6.4%  Diabetes:              >6.4%  Glycemic control for   <7.0% adults with diabetes    Mean Plasma Glucose 128.37 mg/dL    Comment: Performed at [facility], [address], [place], [place] [address]  Protime-INR     Status: Abnormal   Collection Time: 05/26/23  5:00 AM  Result Value Ref Range   Prothrombin Time 15.7 (H) 11.4 - 15.2 seconds   INR 1.2 0.8 - 1.2    Comment: (NOTE) INR goal varies based on device and disease states. Performed at  [facility], [address], [place], [place] [address]   Cortisol-am, blood     Status: Abnormal   Collection Time: 05/26/23  5:00 AM  Result Value Ref Range   Cortisol - AM 26.3 (H) 6.7 - 22.6 ug/dL    Comment: Performed at [facility], [address] [place], [place] [address]  CBC     Status: Abnormal   Collection Time: 05/26/23  5:00 AM  Result Value Ref Range   WBC 18.0 (H) 4.0 - 10.5 K/uL   RBC 3.89 (L) 4.22 - 5.81 MIL/uL   Hemoglobin 11.8 (L) 13.0 - 17.0 g/dL   HCT 29.5 (L) 62.1 - 30.8 %   MCV 90.5 80.0 - 100.0 fL   MCH 30.3 26.0 - 34.0 pg   MCHC 33.5 30.0 - 36.0 g/dL   RDW 65.7 84.6 - 96.2 %   Platelets 259 150 - 400 K/uL   nRBC 0.0 0.0 - 0.2 %    Comment: Performed at [facility], [address] [place], [place] [address]  Comprehensive metabolic panel     Status: Abnormal   Collection Time: 05/26/23  5:00 AM  Result Value Ref Range   Sodium 127 (L) 135 - 145 mmol/L   Potassium 4.0 3.5 - 5.1 mmol/L   Chloride 97 (L) 98 - 111 mmol/L   CO2 22 22 - 32 mmol/L   Glucose, Bld 324 (H) 70 - 99 mg/dL    Comment: Glucose reference range applies only to samples taken after fasting for at least 8 hours.   BUN 11 8 - 23 mg/dL   Creatinine, Ser 1.32 0.61 - 1.24 mg/dL   Calcium  8.5 (L) 8.9 - 10.3 mg/dL   Total Protein 5.8 (L) 6.5 - 8.1 g/dL   Albumin 2.6 (L) 3.5 - 5.0 g/dL   AST 17 15 - 41 U/L   ALT 12 0 - 44 U/L   Alkaline Phosphatase 54 38 - 126 U/L   Total Bilirubin 1.8 (H) 0.0 - 1.2 mg/dL   GFR, Estimated >44 >01 mL/min    Comment: (NOTE) Calculated using the CKD-EPI Creatinine Equation (2021)    Anion gap 8 5 - 15    Comment: Performed at [facility], [address] [place], [place]  [address]  CBG monitoring, ED     Status: Abnormal   Collection Time: 05/26/23  8:02 AM  Result Value Ref Range   Glucose-Capillary 221 (H) 70 - 99 mg/dL    Comment: Glucose reference range applies only to samples taken  after fasting for at least 8 hours.  Heparin  level (unfractionated)     Status: Abnormal   Collection Time: 05/26/23 10:38 AM  Result Value Ref Range   Heparin  Unfractionated <0.10 (L) 0.30 - 0.70 IU/mL    Comment: (NOTE) The clinical reportable range upper limit is being lowered to >1.10 to align with the FDA approved guidance for the current laboratory assay.  If heparin  results are below expected values, and patient dosage has  been confirmed, suggest follow up testing of antithrombin III levels. Performed at [facility], [address] [place], [place] [address]   CBG monitoring, ED     Status: Abnormal   Collection Time: 05/26/23 12:05 PM  Result Value Ref Range   Glucose-Capillary 185 (H) 70 - 99 mg/dL    Comment: Glucose reference range applies only to samples taken after fasting for at least 8 hours.  Glucose, capillary     Status: Abnormal   Collection Time: 05/26/23  4:48 PM  Result Value Ref Range   Glucose-Capillary 175 (H) 70 - 99 mg/dL    Comment: Glucose reference range applies only to samples taken after fasting for at least 8 hours.  Glucose, capillary     Status: Abnormal   Collection Time: 05/26/23 10:10 PM  Result Value Ref Range   Glucose-Capillary 138 (H) 70 - 99 mg/dL    Comment: Glucose reference range applies only to samples taken after fasting for at least 8 hours.  CBC     Status: Abnormal   Collection Time: 05/27/23  5:12 AM  Result Value Ref Range   WBC 12.3 (H) 4.0 - 10.5 K/uL   RBC 3.62 (L) 4.22 - 5.81 MIL/uL   Hemoglobin 10.6 (L) 13.0 - 17.0 g/dL   HCT 69.6 (L) 29.5 - 28.4 %   MCV 90.9 80.0 - 100.0 fL   MCH 29.3 26.0 - 34.0 pg   MCHC 32.2 30.0 - 36.0 g/dL   RDW 13.2 44.0 - 10.2 %   Platelets 176 150 - 400 K/uL   nRBC 0.0 0.0 - 0.2 %    Comment: Performed at [facility], [address] [place], [place] [address]  Comprehensive metabolic panel     Status: Abnormal   Collection Time: 05/27/23  5:12 AM   Result Value Ref Range   Sodium 128 (L) 135 - 145 mmol/L   Potassium 4.0 3.5 - 5.1 mmol/L   Chloride 98 98 - 111 mmol/L   CO2 21 (L) 22 - 32 mmol/L   Glucose, Bld 148 (H) 70 - 99 mg/dL    Comment: Glucose reference range applies only to samples taken after fasting for at least 8 hours.   BUN 12 8 - 23 mg/dL   Creatinine, Ser 6.44 0.61 - 1.24 mg/dL   Calcium  8.2 (L) 8.9 - 10.3 mg/dL   Total Protein 5.5 (L) 6.5 - 8.1 g/dL   Albumin 2.4 (L) 3.5 - 5.0 g/dL   AST 50 (H) 15 - 41 U/L   ALT 25 0 - 44 U/L   Alkaline Phosphatase 55 38 - 126 U/L   Total Bilirubin 1.1 0.0 - 1.2 mg/dL   GFR, Estimated >03 >47 mL/min    Comment: (NOTE)  Calculated using the CKD-EPI Creatinine Equation (2021)    Anion gap 9 5 - 15    Comment: Performed at [facility], [address], [place], [place] [address]  Glucose, capillary     Status: Abnormal   Collection Time: 05/27/23  8:43 AM  Result Value Ref Range   Glucose-Capillary 144 (H) 70 - 99 mg/dL    Comment: Glucose reference range applies only to samples taken after fasting for at least 8 hours.  TSH     Status: None   Collection Time: 05/27/23 10:57 AM  Result Value Ref Range   TSH 2.253 0.350 - 4.500 uIU/mL    Comment: Performed by a 3rd Generation assay with a functional sensitivity of <=0.01 uIU/mL. Performed at [facility], [address], [place], [place] [address]   Magnesium      Status: Abnormal   Collection Time: 05/27/23 10:57 AM  Result Value Ref Range   Magnesium  1.3 (L) 1.7 - 2.4 mg/dL    Comment: Performed at [facility] [address] [place], [place] [address]  Glucose, capillary     Status: Abnormal   Collection Time: 05/27/23 11:25 AM  Result Value Ref Range   Glucose-Capillary 136 (H) 70 - 99 mg/dL    Comment: Glucose reference range applies only to samples taken after fasting for at least 8 hours.  Glucose, capillary     Status: Abnormal   Collection Time: 05/27/23  12:03 PM  Result Value Ref Range   Glucose-Capillary 128 (H) 70 - 99 mg/dL    Comment: Glucose reference range applies only to samples taken after fasting for at least 8 hours.   Comment 1 Notify RN    Comment 2 Document in Chart    CT ABDOMEN PELVIS W CONTRAST Result Date: 05/26/2023 CLINICAL DATA:  Sepsis . Pt was very agitated and fidgety through the entire exam. Best images attainable. EXAM: CT ABDOMEN AND PELVIS WITH CONTRAST TECHNIQUE: Multidetector CT imaging of the abdomen and pelvis was performed using the standard protocol following bolus administration of intravenous contrast. RADIATION DOSE REDUCTION: This exam was performed according to the departmental dose-optimization program which includes automated exposure control, adjustment of the mA and/or kV according to patient size and/or use of iterative reconstruction technique. CONTRAST:  OMNIPAQUE  IOHEXOL  300 MG/ML  SOLN COMPARISON:  X-ray lumbar 03/19/2022, CT abdomen pelvis 11/10/2007 FINDINGS: Lower chest: Trace right pleural effusion. Hepatobiliary: Limited evaluation due to motion artifact with gallbladder wall thickening and pericholecystic fat stranding. Similar-appearing hypodensity of the left hepatic lobe likely a simple hepatic cyst (2:22). No CT evidence of gallstones. No pericholecystic fluid. No biliary dilatation. Pancreas: No focal lesion. Normal pancreatic contour. No surrounding inflammatory changes. No main pancreatic ductal dilatation. Spleen: Normal in size without focal abnormality. Adrenals/Urinary Tract: No adrenal nodule bilaterally. Bilateral kidneys enhance symmetrically. Parapelvic simple cysts of the left kidney. Simple renal cysts, in the absence of clinically indicated signs/symptoms, require no independent follow-up. No hydronephrosis. No hydroureter.  No nephroureterolithiasis. The urinary bladder is unremarkable. Minimal excretion of intravenous contrast of either kidneys on delayed view. Stomach/Bowel:  Stomach is within normal limits. No evidence of bowel wall thickening or dilatation. Third portion of the duodenum diverticula. Appendix appears normal. Vascular/Lymphatic: No abdominal aorta or iliac aneurysm. Severe atherosclerotic plaque of the aorta and its branches. No abdominal, pelvic, or inguinal lymphadenopathy. Reproductive: Prostate is unremarkable. Other: No intraperitoneal free fluid. No intraperitoneal free gas. No organized fluid collection. Musculoskeletal: No abdominal wall  hernia or abnormality. No suspicious lytic or blastic osseous lesions. No acute displaced fracture. Chronic severe L1 compression fracture. Multilevel severe degenerative changes of the spine. Partially visualized left femur intramedullary nail fixation. IMPRESSION: 1. Gallbladder wall thickening and pericholecystic fat stranding. Question acute cholecystitis. Finding better evaluated on ultrasound abdomen 05/26/2023. 2. Trace right pleural effusion. 3. Third portion of the duodenum diverticula. Electronically Signed   By: Morgane  Naveau M.D.   On: 05/26/2023 22:24   US  Abdomen Limited RUQ (LIVER/GB) Result Date: 05/26/2023 CLINICAL DATA:  Fever.  Abnormal gallbladder on chest CT. EXAM: ULTRASOUND ABDOMEN LIMITED RIGHT UPPER QUADRANT COMPARISON:  CT chest from same day. CT abdomen pelvis dated November 10, 2007. FINDINGS: Gallbladder: Distended with sludge. Moderate wall thickening. No gallstones. No sonographic Murphy sign noted by sonographer. Common bile duct: Not visualized, but nondilated on earlier CT. Liver: No focal lesion identified. Within normal limits in parenchymal echogenicity. Portal vein is patent on color Doppler imaging with normal direction of blood flow towards the liver. Other: None. IMPRESSION: 1. Distended gallbladder with sludge and moderate wall thickening. No gallstones. Findings are equivocal for acute cholecystitis. Consider further evaluation with nuclear medicine hepatobiliary scan. Electronically  Signed   By: Aleta Anda M.D.   On: 05/26/2023 15:30   CT CHEST WO CONTRAST Result Date: 05/26/2023 CLINICAL DATA:  Shortness of breath. Cold symptoms for the past few weeks. EXAM: CT CHEST WITHOUT CONTRAST TECHNIQUE: Multidetector CT imaging of the chest was performed following the standard protocol without IV contrast. RADIATION DOSE REDUCTION: This exam was performed according to the departmental dose-optimization program which includes automated exposure control, adjustment of the mA and/or kV according to patient size and/or use of iterative reconstruction technique. COMPARISON:  Chest x-ray from same day. CT chest dated February 04, 2021. FINDINGS: Cardiovascular: No significant vascular findings. Normal heart size. No pericardial effusion. No thoracic aortic aneurysm. Coronary, aortic arch, and branch vessel atherosclerotic vascular disease. Mediastinum/Nodes: No enlarged mediastinal or axillary lymph nodes. Thyroid  gland, trachea, and esophagus demonstrate no significant findings. Lungs/Pleura: Bibasilar subsegmental atelectasis. No focal consolidation, pleural effusion, or pneumothorax. Upper Abdomen: Distended gallbladder with mild wall thickening and surrounding inflammatory change. Musculoskeletal: No chest wall mass or suspicious bone lesions identified. IMPRESSION: 1. No acute intrathoracic process. 2. Distended gallbladder with mild wall thickening and surrounding inflammatory change, concerning for acute cholecystitis. Correlate with right upper quadrant pain and consider further evaluation with right upper quadrant ultrasound. 3.  Aortic Atherosclerosis (ICD10-I70.0). Electronically Signed   By: Aleta Anda M.D.   On: 05/26/2023 13:29   ECHOCARDIOGRAM COMPLETE Result Date: 05/26/2023    ECHOCARDIOGRAM REPORT   Patient Name:   Jonathan Owen [place] Date of Exam: 05/26/2023 Medical Rec #:  [id number]         Height:       66.5 in Accession #:    [id number]        Weight:       205.5 lb Date of  Birth:  Apr 03, 1928          BSA:          2.034 m Patient Age:    89 years          BP:           136/87 mmHg Patient Gender: M                 HR:           100 bpm. Exam Location:  Inpatient Procedure: 2D  Echo, Cardiac Doppler and Color Doppler Indications:    Arrhythmia  History:        Patient has no prior history of Echocardiogram examinations.                 Risk Factors:Hypertension and Diabetes.  Sonographer:    Astrid Blamer Referring Phys: [id number] MOHAMMAD L GARBA IMPRESSIONS  1. Left ventricular ejection fraction, by estimation, is 60 to 65%. The left ventricle has normal function. Left ventricular endocardial border not optimally defined to evaluate regional wall motion. Left ventricular diastolic parameters are consistent with Grade I diastolic dysfunction (impaired relaxation).  2. Right ventricular systolic function was not well visualized. The right ventricular size is normal.  3. Left atrial size was moderately dilated.  4. The mitral valve is grossly normal. No evidence of mitral valve regurgitation. No evidence of mitral stenosis.  5. The aortic valve was not well visualized. There is moderate calcification of the aortic valve. Aortic valve regurgitation is not visualized. Aortic valve sclerosis/calcification is present, without any evidence of aortic stenosis. Aortic valve area, by VTI measures 2.09 cm. Aortic valve mean gradient measures 11.0 mmHg. Aortic valve Vmax measures 1.92 m/s.  6. Aortic dilatation noted. There is mild dilatation of the aortic root, measuring 42 mm. Normal size of ascending aorta. Comparison(s): No prior Echocardiogram. FINDINGS  Left Ventricle: Left ventricular ejection fraction, by estimation, is 60 to 65%. The left ventricle has normal function. Left ventricular endocardial border not optimally defined to evaluate regional wall motion. The left ventricular internal cavity size was normal in size. There is no left ventricular hypertrophy. Left ventricular diastolic  parameters are consistent with Grade I diastolic dysfunction (impaired relaxation). Normal left ventricular filling pressure. Right Ventricle: The right ventricular size is normal. No increase in right ventricular wall thickness. Right ventricular systolic function was not well visualized. Left Atrium: Left atrial size was moderately dilated. Right Atrium: Right atrial size was normal in size. Pericardium: There is no evidence of pericardial effusion. Mitral Valve: The mitral valve is grossly normal. No evidence of mitral valve regurgitation. No evidence of mitral valve stenosis. Tricuspid Valve: The tricuspid valve is not well visualized. Tricuspid valve regurgitation is trivial. No evidence of tricuspid stenosis. Aortic Valve: The aortic valve was not well visualized. There is moderate calcification of the aortic valve. Aortic valve regurgitation is not visualized. Aortic valve sclerosis/calcification is present, without any evidence of aortic stenosis. Aortic valve mean gradient measures 11.0 mmHg. Aortic valve peak gradient measures 14.7 mmHg. Aortic valve area, by VTI measures 2.09 cm. Pulmonic Valve: The pulmonic valve was not well visualized. Pulmonic valve regurgitation is trivial. No evidence of pulmonic stenosis. Aorta: Aortic dilatation noted. There is mild dilatation of the aortic root, measuring 42 mm. Venous: The inferior vena cava was not well visualized. IAS/Shunts: The interatrial septum was not well visualized.  LEFT VENTRICLE PLAX 2D LVIDd:         4.90 cm   Diastology LVIDs:         3.60 cm   LV e' medial:    8.49 cm/s LV PW:         1.00 cm   LV E/e' medial:  9.8 LV IVS:        1.00 cm   LV e' lateral:   10.10 cm/s LVOT diam:     2.00 cm   LV E/e' lateral: 8.3 LV SV:         74 LV SV Index:   37 LVOT  Area:     3.14 cm  RIGHT VENTRICLE RV S prime:     23.00 cm/s TAPSE (M-mode): 2.8 cm LEFT ATRIUM             Index LA Vol (A2C):   45.8 ml 22.51 ml/m LA Vol (A4C):   88.1 ml 43.31 ml/m LA  Biplane Vol: 64.3 ml 31.61 ml/m  AORTIC VALVE AV Area (Vmax):    1.85 cm AV Area (Vmean):   1.73 cm AV Area (VTI):     2.09 cm AV Vmax:           192.00 cm/s AV Vmean:          164.000 cm/s AV VTI:            0.356 m AV Peak Grad:      14.7 mmHg AV Mean Grad:      11.0 mmHg LVOT Vmax:         113.00 cm/s LVOT Vmean:        90.400 cm/s LVOT VTI:          0.237 m LVOT/AV VTI ratio: 0.67  AORTA Ao Root diam: 4.20 cm MITRAL VALVE               TRICUSPID VALVE MV Area (PHT): 3.27 cm    TR Peak grad:   35.0 mmHg MV Decel Time: 232 msec    TR Vmax:        296.00 cm/s MV E velocity: 83.60 cm/s MV A velocity: 84.00 cm/s  SHUNTS MV E/A ratio:  1.00        Systemic VTI:  0.24 m                            Systemic Diam: 2.00 cm Vishnu Priya Mallipeddi Electronically signed by Lucetta Russel Mallipeddi Signature Date/Time: 05/26/2023/11:18:32 AM    Final    DG Chest Portable 1 View Result Date: 05/26/2023 CLINICAL DATA:  Increased heart rate and shortness of breath. EXAM: PORTABLE CHEST 1 VIEW COMPARISON:  May 25, 2023 FINDINGS: Multiple sternal wires are noted. Cardiac silhouette is mildly enlarged and unchanged in size. Low lung volumes are seen. Mild atelectasis is present within the bilateral lung bases. No pleural effusion or pneumothorax is identified. Multilevel degenerative changes are seen throughout the thoracic spine. IMPRESSION: 1. Evidence of prior median sternotomy. 2. Low lung volumes with mild bibasilar atelectasis. Electronically Signed   By: Virgle Grime M.D.   On: 05/26/2023 01:14   DG Chest Portable 1 View Result Date: 05/25/2023 CLINICAL DATA:  Cough, sepsis EXAM: PORTABLE CHEST 1 VIEW COMPARISON:  07/18/2021 FINDINGS: Single frontal view of the chest demonstrates stable postsurgical changes from median sternotomy. The cardiac silhouette is enlarged. Lung volumes are diminished, with crowding of the central vasculature. No airspace disease, effusion, or pneumothorax. IMPRESSION: 1. Low lung  volumes.  No acute airspace disease. Electronically Signed   By: Bobbye Burrow M.D.   On: 05/25/2023 21:52    Anti-infectives (From admission, onward)    Start     Dose/Rate Route Frequency Ordered Stop   05/27/23 2200  vancomycin  (VANCOCIN ) IVPB 1000 mg/200 mL premix        1,000 mg 200 mL/hr over 60 Minutes Intravenous Every 24 hours 05/27/23 0724     05/26/23 2200  vancomycin  (VANCOREADY) IVPB 1750 mg/350 mL  Status:  Discontinued        1,750 mg 175 mL/hr over 120 Minutes  Intravenous Every 24 hours 05/26/23 0050 05/27/23 0724   05/26/23 1000  metroNIDAZOLE  (FLAGYL ) IVPB 500 mg        500 mg 100 mL/hr over 60 Minutes Intravenous Every 12 hours 05/26/23 0415 06/02/23 0959   05/26/23 0415  ceFEPIme  (MAXIPIME ) 2 g in sodium chloride  0.9 % 100 mL IVPB  Status:  Discontinued        2 g 200 mL/hr over 30 Minutes Intravenous  Once 05/26/23 0411 05/26/23 0414   05/26/23 0415  metroNIDAZOLE  (FLAGYL ) IVPB 500 mg  Status:  Discontinued        500 mg 100 mL/hr over 60 Minutes Intravenous Every 12 hours 05/26/23 0411 05/26/23 0415   05/26/23 0415  vancomycin  (VANCOCIN ) IVPB 1000 mg/200 mL premix  Status:  Discontinued        1,000 mg 200 mL/hr over 60 Minutes Intravenous  Once 05/26/23 0411 05/26/23 0415   05/26/23 0400  ceFEPIme  (MAXIPIME ) 2 g in sodium chloride  0.9 % 100 mL IVPB        2 g 200 mL/hr over 30 Minutes Intravenous Every 8 hours 05/26/23 0050     05/25/23 2100  ceFEPIme  (MAXIPIME ) 2 g in sodium chloride  0.9 % 100 mL IVPB        2 g 200 mL/hr over 30 Minutes Intravenous  Once 05/25/23 2049 05/25/23 2137   05/25/23 2100  metroNIDAZOLE  (FLAGYL ) IVPB 500 mg        500 mg 100 mL/hr over 60 Minutes Intravenous  Once 05/25/23 2049 05/25/23 2312   05/25/23 2100  vancomycin  (VANCOCIN ) IVPB 1000 mg/200 mL premix        1,000 mg 200 mL/hr over 60 Minutes Intravenous  Once 05/25/23 2049 05/25/23 2207       Assessment/Plan Acute Cholecystitis   This is a 89 year old male who  presented w/ fever of unknown etiology.  Resp panel negative, ua negative, cxr/ct chest without pna.  RUQ US  with sludge, no stones and moderate wall thickening.  CT A/P with gallbladder wall thickening and pericholecystic fat stranding. WBC 18 > 12.3. No lipase has been checked - added on. AST 50. T. Bili 2.1 > 1.8 > 1.1. ALT/Alk Phos wnl. No blood cx obtained - consider checking.   Hospital course has been complicated by new onset A-fib with RVR.  Cardiology has seen.  They think this is 2/2 to infection. He is currently on Cardizem  and heparin  drip.  He has since been moved down to the ICU.  TRH working on resuscitation. Last lactic 2.0 - repeat pending. Fever resolved. BP improved to 131/70. He is not currently on pressors.   Discussed case with attending. Cont abx. Keep NPO. Will consult IR for perc chole.   I reviewed nursing notes, last 24 h vitals and pain scores, last 48 h intake and output, last 24 h labs and trends, and last 24 h imaging results.  Delton Filbert, [facility] 05/27/2023, 12:24 PM Please see Amion for pager number during day hours 7:00am-4:30pm

## 2023-05-27 NOTE — Progress Notes (Signed)
 PHARMACY - ANTICOAGULATION CONSULT NOTE  Pharmacy Consult for heparin  Indication: atrial fibrillation  Allergies  Allergen Reactions   Tape Other (See Comments)    Unknown    Patient Measurements: Height: 5' 6.5" (168.9 cm) Weight: 93.2 kg (205 lb 7.5 oz) IBW/kg (Calculated) : 64.95 Heparin  Dosing Weight: 84 kg  Vital Signs: Temp: 100.5 F (38.1 C) (02/10 0614) Temp Source: Oral (02/10 0446) BP: 137/74 (02/10 0614) Pulse Rate: 156 (02/10 0518)  Labs: Recent Labs    05/25/23 2042 05/25/23 2055 05/25/23 2130 05/26/23 0500 05/26/23 1038 05/27/23 0512  HGB 12.3*  --   --  11.8*  --  10.6*  HCT 37.1*  --   --  35.2*  --  32.9*  PLT 269  --   --  259  --  176  APTT  --  25  --   --   --   --   LABPROT  --  15.2  --  15.7*  --   --   INR  --  1.2  --  1.2  --   --   HEPARINUNFRC  --   --   --   --  <0.10*  --   CREATININE  --   --  0.81 0.74  --  0.68    Estimated Creatinine Clearance: 62.3 mL/min (by C-G formula based on SCr of 0.68 mg/dL).   Medical History: Past Medical History:  Diagnosis Date   Arthritis    Cancer (HCC)    hx colon cancer   Diabetes mellitus without complication (HCC)    no meds-watches diet   Hypertension    no meds now-   Wears dentures    upper-partial bottom   Wears glasses      Assessment: 89 yo male with afib with RVR; later in sinus tachycardia. Heparin  and diltiazem  stopped as he had converted to NSR. Patient now back in afib and heparin  to restart.  Pt has taken apixaban  in the past, says he doesn't take anymore, no recent fill hx (daughter confirmed pt is no longer taking apixaban ).  Goal of Therapy:  Heparin  level 0.3-0.7 units/ml Monitor platelets by anticoagulation protocol: Yes   Plan:  -Heparin  bolus 4000 units -Start heparin  infusion at 1200 units/hr -Check heparin  level in 8 hours -Daily CBC   Lolita Rise, PharmD, BCPS Clinical Pharmacist 05/27/2023 7:55 AM

## 2023-05-27 NOTE — Progress Notes (Signed)
 PROGRESS NOTE  Jonathan Owen  YQM:[id number] DOB: Sep 10, 1927 DOA: 05/25/2023 PCP: Madelyne Schiff, MD   Brief Narrative: Patient is a 36 male with history of insulin -dependent diabetes type 2, hypertension, colon cancer, osteoarthritis, prior PE who presented with shortness of breath, fever, chills, weakness from home. He felt like being smothered. On presentation, he was tachycardiac, febrile.  Suspected to have sepsis of unclear etiology.  UA was not suspicoius  UTI.  Chest x-ray did not show any pneumonia.  Also briefly went to A-fib with RVR in the emergency department.  Ultrasound of the abdomen/CT abdomen/pelvis showed acute cholecystitis.  General surgery, cardiology consulted.  Assessment & Plan:  Principal Problem:   Sepsis (HCC) Active Problems:   HTN (hypertension)   Type 2 diabetes mellitus without complication (HCC)   Hyponatremia   Atrial fibrillation with RVR (HCC)   Sepsis: Presented with fever, leukocytosis, tachycardia.  Likely from abdominal cause.  He did not complain of severe abdominal pain on admission, right upper quadrant is mostly nontender.  Continue broad spectrum antibiotics.  Follow-up cultures.  Currently blood pressure stable.  Mildly elevated lactate level on presentation.  Has leukocytosis but now improving  Acute cholecystitis: Presented with fever.  Liver enzymes normal.  CT abdomen/pelvis, right upper quadrant ultrasound shows stable for acute cholecystitis.  General surgery consulted.  Currently n.p.o.  New onset A-fib with RVR: Started on Cardizem  drip.  This is most likely triggered from sepsis.  Started heparin  drip.  Cardiology consulted  Insulin -dependent diabetes type 2: Recent A1c of 6.1.  Takes insulin  at home.  Continue sliding scale, long-acting insulin .  Monitor blood sugars  Hyponatremia: Continue IV fluid, improving  Hypertension: Currently BP stable.  Weakness: Patient lives alone.  Ambulatory at baseline.  Will consult PT when  appropriate  Confusion: This is most likely hospital-acquired delirium.  Minimize sedatives, narcotics continue frequent reorientation, delirium precautions  Goals of care: Goals of care discharge with daughter Edwina Gram.  She is expecting full recovery.  Patient is usually fully independent and healthy.  Patient remains full code.       DVT prophylaxis:iv heparin      Code Status: Full Code  Family Communication: called and discussed with daughter on phone on 2/9,2/10  Patient status: Inpatient  Patient is from : Home  Anticipated discharge to: Home  Estimated DC date: not sure at the moment   Consultants: General surgery, cardiology  Procedures: None  Antimicrobials:  Anti-infectives (From admission, onward)    Start     Dose/Rate Route Frequency Ordered Stop   05/27/23 2200  vancomycin  (VANCOCIN ) IVPB 1000 mg/200 mL premix        1,000 mg 200 mL/hr over 60 Minutes Intravenous Every 24 hours 05/27/23 0724     05/26/23 2200  vancomycin  (VANCOREADY) IVPB 1750 mg/350 mL  Status:  Discontinued        1,750 mg 175 mL/hr over 120 Minutes Intravenous Every 24 hours 05/26/23 0050 05/27/23 0724   05/26/23 1000  metroNIDAZOLE  (FLAGYL ) IVPB 500 mg        500 mg 100 mL/hr over 60 Minutes Intravenous Every 12 hours 05/26/23 0415 06/02/23 0959   05/26/23 0415  ceFEPIme  (MAXIPIME ) 2 g in sodium chloride  0.9 % 100 mL IVPB  Status:  Discontinued        2 g 200 mL/hr over 30 Minutes Intravenous  Once 05/26/23 0411 05/26/23 0414   05/26/23 0415  metroNIDAZOLE  (FLAGYL ) IVPB 500 mg  Status:  Discontinued  500 mg 100 mL/hr over 60 Minutes Intravenous Every 12 hours 05/26/23 0411 05/26/23 0415   05/26/23 0415  vancomycin  (VANCOCIN ) IVPB 1000 mg/200 mL premix  Status:  Discontinued        1,000 mg 200 mL/hr over 60 Minutes Intravenous  Once 05/26/23 0411 05/26/23 0415   05/26/23 0400  ceFEPIme  (MAXIPIME ) 2 g in sodium chloride  0.9 % 100 mL IVPB        2 g 200 mL/hr over 30 Minutes  Intravenous Every 8 hours 05/26/23 0050     05/25/23 2100  ceFEPIme  (MAXIPIME ) 2 g in sodium chloride  0.9 % 100 mL IVPB        2 g 200 mL/hr over 30 Minutes Intravenous  Once 05/25/23 2049 05/25/23 2137   05/25/23 2100  metroNIDAZOLE  (FLAGYL ) IVPB 500 mg        500 mg 100 mL/hr over 60 Minutes Intravenous  Once 05/25/23 2049 05/25/23 2312   05/25/23 2100  vancomycin  (VANCOCIN ) IVPB 1000 mg/200 mL premix        1,000 mg 200 mL/hr over 60 Minutes Intravenous  Once 05/25/23 2049 05/25/23 2207       Subjective: Patient seen and examined at bedside today.  During my evaluation, he was sleepy, drowsy, given Haldol  for agitation last night.  Remains in A-fib with RVR.  Blood pressure stable.  Had fever of 100.5 F this morning.  Remains on 2 L of oxygen per minute. I again had a long discussion with her daughter on phone who is currently in [place] .  We discussed about his management plan.  We discussed about goals of care, his comorbidities and advanced age. Daughter wants to continue aggressive approach of treatment  Objective: Vitals:   05/27/23 0629 05/27/23 0800 05/27/23 0815 05/27/23 1004  BP:  98/62 101/63 114/72  Pulse:  (!) 121 (!) 121 (!) 117  Resp: (!) 29 (!) 24 (!) 22   Temp:   98.8 F (37.1 C)   TempSrc:   Oral   SpO2:   100%   Weight:      Height:        Intake/Output Summary (Last 24 hours) at 05/27/2023 1018 Last data filed at 05/27/2023 0600 Gross per 24 hour  Intake 3511.42 ml  Output 1100 ml  Net 2411.42 ml   Filed Weights   05/26/23 0000  Weight: 93.2 kg    Examination:    General exam: Sleepy/drowsy Respiratory system:  no wheezes or crackles , mild diminished bilaterally at bases Cardiovascular system: A-fib with RVR  gastrointestinal system: Abdomen is nondistended, soft and nontender. Central nervous system: Not alert or oriented Extremities: No edema, no clubbing ,no cyanosis Skin: No rashes, no ulcers,no icterus     Data Reviewed: I have  personally reviewed following labs and imaging studies  CBC: Recent Labs  Lab 05/25/23 2042 05/26/23 0500 05/27/23 0512  WBC 16.3* 18.0* 12.3*  NEUTROABS 14.4*  --   --   HGB 12.3* 11.8* 10.6*  HCT 37.1* 35.2* 32.9*  MCV 90.3 90.5 90.9  PLT 269 259 176   Basic Metabolic Panel: Recent Labs  Lab 05/25/23 2130 05/26/23 0500 05/27/23 0512  NA 126* 127* 128*  K 4.5 4.0 4.0  CL 95* 97* 98  CO2 20* 22 21*  GLUCOSE 266* 324* 148*  BUN 14 11 12   CREATININE 0.81 0.74 0.68  CALCIUM  9.0 8.5* 8.2*     Recent Results (from the past 240 hours)  Culture, blood (Routine x 2)  Status: None (Preliminary result)   Collection Time: 05/25/23  8:43 PM   Specimen: BLOOD  Result Value Ref Range Status   Specimen Description   Final    BLOOD BLOOD LEFT ARM Performed at [facility], [address] [place], [place] [address]    Special Requests   Final    Blood Culture results may not be optimal due to an inadequate volume of blood received in culture bottles BOTTLES DRAWN AEROBIC AND ANAEROBIC Performed at [facility], [address], [place], [place] [address]    Culture   Final    NO GROWTH < 12 HOURS Performed at [facility], [address], [place], [place] [address]    Report Status PENDING  Incomplete  Culture, blood (Routine x 2)     Status: None (Preliminary result)   Collection Time: 05/25/23  8:45 PM   Specimen: BLOOD  Result Value Ref Range Status   Specimen Description   Final    BLOOD BLOOD RIGHT FOREARM Performed at [facility], [address], [place], [place] [address]    Special Requests   Final    Blood Culture results may not be optimal due to an inadequate volume of blood received in culture bottles BOTTLES DRAWN AEROBIC AND ANAEROBIC Performed at [facility], [address] [place], [place] [address]    Culture   Final    NO GROWTH < 12 HOURS Performed at [facility], [address] [place], [place] [address]    Report Status PENDING  Incomplete  Resp panel by RT-PCR (RSV, Flu A&B, Covid)     Status: None   Collection Time: 05/25/23  9:05 PM  Result Value Ref Range Status   SARS Coronavirus 2 by RT PCR NEGATIVE NEGATIVE Final    Comment: (NOTE) SARS-CoV-2 target nucleic acids are NOT DETECTED.  The SARS-CoV-2 RNA is generally detectable in upper respiratory specimens during the acute phase of infection. The lowest concentration of SARS-CoV-2 viral copies this assay can detect is 138 copies/mL. A negative result does not preclude SARS-Cov-2 infection and should not be used as the sole basis for treatment or other patient management decisions. A negative result may occur with  improper specimen collection/handling, submission of specimen other than nasopharyngeal swab, presence of viral mutation(s) within the areas targeted by this assay, and inadequate number of viral copies(<138 copies/mL). A negative result must be combined with clinical observations, patient history, and epidemiological information. The expected result is Negative.  Fact Sheet for Patients:  BloggerCourse.com  Fact Sheet for Healthcare Providers:  SeriousBroker.it  This test is no t yet approved or cleared by the [place]  FDA and  has been authorized for detection and/or diagnosis of SARS-CoV-2 by FDA under an Emergency Use Authorization (EUA). This EUA will remain  in effect (meaning this test can be used) for the duration of the COVID-19 declaration under Section 564(b)(1) of the Act, 21 U.S.C.section 360bbb-3(b)(1), unless the authorization is terminated  or revoked sooner.       Influenza A by PCR NEGATIVE NEGATIVE Final   Influenza B by PCR NEGATIVE NEGATIVE Final    Comment: (NOTE) The Xpert Xpress SARS-CoV-2/FLU/RSV plus assay is intended as an aid in the diagnosis of influenza from Nasopharyngeal  swab specimens and should not be used as a sole basis for treatment. Nasal washings and aspirates are unacceptable for Xpert Xpress SARS-CoV-2/FLU/RSV testing.  Fact Sheet for Patients: BloggerCourse.com  Fact Sheet for Healthcare Providers: SeriousBroker.it  This test is not yet approved or cleared by the [place]  FDA and has been authorized for detection and/or diagnosis of SARS-CoV-2 by FDA under an Emergency Use Authorization (EUA). This EUA will remain in effect (meaning this test can be used) for the duration of the COVID-19 declaration under Section 564(b)(1) of the Act, 21 U.S.C. section 360bbb-3(b)(1), unless the authorization is terminated or revoked.     Resp Syncytial Virus by PCR NEGATIVE NEGATIVE Final    Comment: (NOTE) Fact Sheet for Patients: BloggerCourse.com  Fact Sheet for Healthcare Providers: SeriousBroker.it  This test is not yet approved or cleared by the [place]  FDA and has been authorized for detection and/or diagnosis of SARS-CoV-2 by FDA under an Emergency Use Authorization (EUA). This EUA will remain in effect (meaning this test can be used) for the duration of the COVID-19 declaration under Section 564(b)(1) of the Act, 21 U.S.C. section 360bbb-3(b)(1), unless the authorization is terminated or revoked.  Performed at [facility], [address] [place], [place] [address]      Radiology Studies: CT ABDOMEN PELVIS W CONTRAST Result Date: 05/26/2023 CLINICAL DATA:  Sepsis . Pt was very agitated and fidgety through the entire exam. Best images attainable. EXAM: CT ABDOMEN AND PELVIS WITH CONTRAST TECHNIQUE: Multidetector CT imaging of the abdomen and pelvis was performed using the standard protocol following bolus administration of intravenous contrast. RADIATION DOSE REDUCTION: This exam was performed according to the  departmental dose-optimization program which includes automated exposure control, adjustment of the mA and/or kV according to patient size and/or use of iterative reconstruction technique. CONTRAST:  OMNIPAQUE  IOHEXOL  300 MG/ML  SOLN COMPARISON:  X-ray lumbar 03/19/2022, CT abdomen pelvis 11/10/2007 FINDINGS: Lower chest: Trace right pleural effusion. Hepatobiliary: Limited evaluation due to motion artifact with gallbladder wall thickening and pericholecystic fat stranding. Similar-appearing hypodensity of the left hepatic lobe likely a simple hepatic cyst (2:22). No CT evidence of gallstones. No pericholecystic fluid. No biliary dilatation. Pancreas: No focal lesion. Normal pancreatic contour. No surrounding inflammatory changes. No main pancreatic ductal dilatation. Spleen: Normal in size without focal abnormality. Adrenals/Urinary Tract: No adrenal nodule bilaterally. Bilateral kidneys enhance symmetrically. Parapelvic simple cysts of the left kidney. Simple renal cysts, in the absence of clinically indicated signs/symptoms, require no independent follow-up. No hydronephrosis. No hydroureter.  No nephroureterolithiasis. The urinary bladder is unremarkable. Minimal excretion of intravenous contrast of either kidneys on delayed view. Stomach/Bowel: Stomach is within normal limits. No evidence of bowel wall thickening or dilatation. Third portion of the duodenum diverticula. Appendix appears normal. Vascular/Lymphatic: No abdominal aorta or iliac aneurysm. Severe atherosclerotic plaque of the aorta and its branches. No abdominal, pelvic, or inguinal lymphadenopathy. Reproductive: Prostate is unremarkable. Other: No intraperitoneal free fluid. No intraperitoneal free gas. No organized fluid collection. Musculoskeletal: No abdominal wall hernia or abnormality. No suspicious lytic or blastic osseous lesions. No acute displaced fracture. Chronic severe L1 compression fracture. Multilevel severe degenerative  changes of the spine. Partially visualized left femur intramedullary nail fixation. IMPRESSION: 1. Gallbladder wall thickening and pericholecystic fat stranding. Question acute cholecystitis. Finding better evaluated on ultrasound abdomen 05/26/2023. 2. Trace right pleural effusion. 3. Third portion of the duodenum diverticula. Electronically Signed   By: Morgane  Naveau M.D.   On: 05/26/2023 22:24   US  Abdomen Limited RUQ (LIVER/GB) Result Date: 05/26/2023 CLINICAL DATA:  Fever.  Abnormal gallbladder on chest CT. EXAM: ULTRASOUND ABDOMEN LIMITED RIGHT UPPER QUADRANT COMPARISON:  CT chest from same  day. CT abdomen pelvis dated November 10, 2007. FINDINGS: Gallbladder: Distended with sludge. Moderate wall thickening. No gallstones. No sonographic Murphy sign noted by sonographer. Common bile duct: Not visualized, but nondilated on earlier CT. Liver: No focal lesion identified. Within normal limits in parenchymal echogenicity. Portal vein is patent on color Doppler imaging with normal direction of blood flow towards the liver. Other: None. IMPRESSION: 1. Distended gallbladder with sludge and moderate wall thickening. No gallstones. Findings are equivocal for acute cholecystitis. Consider further evaluation with nuclear medicine hepatobiliary scan. Electronically Signed   By: Aleta Anda M.D.   On: 05/26/2023 15:30   CT CHEST WO CONTRAST Result Date: 05/26/2023 CLINICAL DATA:  Shortness of breath. Cold symptoms for the past few weeks. EXAM: CT CHEST WITHOUT CONTRAST TECHNIQUE: Multidetector CT imaging of the chest was performed following the standard protocol without IV contrast. RADIATION DOSE REDUCTION: This exam was performed according to the departmental dose-optimization program which includes automated exposure control, adjustment of the mA and/or kV according to patient size and/or use of iterative reconstruction technique. COMPARISON:  Chest x-ray from same day. CT chest dated February 04, 2021. FINDINGS:  Cardiovascular: No significant vascular findings. Normal heart size. No pericardial effusion. No thoracic aortic aneurysm. Coronary, aortic arch, and branch vessel atherosclerotic vascular disease. Mediastinum/Nodes: No enlarged mediastinal or axillary lymph nodes. Thyroid  gland, trachea, and esophagus demonstrate no significant findings. Lungs/Pleura: Bibasilar subsegmental atelectasis. No focal consolidation, pleural effusion, or pneumothorax. Upper Abdomen: Distended gallbladder with mild wall thickening and surrounding inflammatory change. Musculoskeletal: No chest wall mass or suspicious bone lesions identified. IMPRESSION: 1. No acute intrathoracic process. 2. Distended gallbladder with mild wall thickening and surrounding inflammatory change, concerning for acute cholecystitis. Correlate with right upper quadrant pain and consider further evaluation with right upper quadrant ultrasound. 3.  Aortic Atherosclerosis (ICD10-I70.0). Electronically Signed   By: Aleta Anda M.D.   On: 05/26/2023 13:29   ECHOCARDIOGRAM COMPLETE Result Date: 05/26/2023    ECHOCARDIOGRAM REPORT   Patient Name:   JEVONTA OKRAY Leven Date of Exam: 05/26/2023 Medical Rec #:  [id number]         Height:       66.5 in Accession #:    [id number]        Weight:       205.5 lb Date of Birth:  [birth year]          BSA:          2.034 m Patient Age:    89 years          BP:           136/87 mmHg Patient Gender: M                 HR:           100 bpm. Exam Location:  Inpatient Procedure: 2D Echo, Cardiac Doppler and Color Doppler Indications:    Arrhythmia  History:        Patient has no prior history of Echocardiogram examinations.                 Risk Factors:Hypertension and Diabetes.  Sonographer:    Astrid Blamer Referring Phys: [id number] MOHAMMAD L GARBA IMPRESSIONS  1. Left ventricular ejection fraction, by estimation, is 60 to 65%. The left ventricle has normal function. Left ventricular endocardial border not optimally defined to evaluate  regional wall motion. Left ventricular diastolic parameters are consistent with Grade I diastolic dysfunction (impaired relaxation).  2. Right ventricular systolic function  was not well visualized. The right ventricular size is normal.  3. Left atrial size was moderately dilated.  4. The mitral valve is grossly normal. No evidence of mitral valve regurgitation. No evidence of mitral stenosis.  5. The aortic valve was not well visualized. There is moderate calcification of the aortic valve. Aortic valve regurgitation is not visualized. Aortic valve sclerosis/calcification is present, without any evidence of aortic stenosis. Aortic valve area, by VTI measures 2.09 cm. Aortic valve mean gradient measures 11.0 mmHg. Aortic valve Vmax measures 1.92 m/s.  6. Aortic dilatation noted. There is mild dilatation of the aortic root, measuring 42 mm. Normal size of ascending aorta. Comparison(s): No prior Echocardiogram. FINDINGS  Left Ventricle: Left ventricular ejection fraction, by estimation, is 60 to 65%. The left ventricle has normal function. Left ventricular endocardial border not optimally defined to evaluate regional wall motion. The left ventricular internal cavity size was normal in size. There is no left ventricular hypertrophy. Left ventricular diastolic parameters are consistent with Grade I diastolic dysfunction (impaired relaxation). Normal left ventricular filling pressure. Right Ventricle: The right ventricular size is normal. No increase in right ventricular wall thickness. Right ventricular systolic function was not well visualized. Left Atrium: Left atrial size was moderately dilated. Right Atrium: Right atrial size was normal in size. Pericardium: There is no evidence of pericardial effusion. Mitral Valve: The mitral valve is grossly normal. No evidence of mitral valve regurgitation. No evidence of mitral valve stenosis. Tricuspid Valve: The tricuspid valve is not well visualized. Tricuspid valve  regurgitation is trivial. No evidence of tricuspid stenosis. Aortic Valve: The aortic valve was not well visualized. There is moderate calcification of the aortic valve. Aortic valve regurgitation is not visualized. Aortic valve sclerosis/calcification is present, without any evidence of aortic stenosis. Aortic valve mean gradient measures 11.0 mmHg. Aortic valve peak gradient measures 14.7 mmHg. Aortic valve area, by VTI measures 2.09 cm. Pulmonic Valve: The pulmonic valve was not well visualized. Pulmonic valve regurgitation is trivial. No evidence of pulmonic stenosis. Aorta: Aortic dilatation noted. There is mild dilatation of the aortic root, measuring 42 mm. Venous: The inferior vena cava was not well visualized. IAS/Shunts: The interatrial septum was not well visualized.  LEFT VENTRICLE PLAX 2D LVIDd:         4.90 cm   Diastology LVIDs:         3.60 cm   LV e' medial:    8.49 cm/s LV PW:         1.00 cm   LV E/e' medial:  9.8 LV IVS:        1.00 cm   LV e' lateral:   10.10 cm/s LVOT diam:     2.00 cm   LV E/e' lateral: 8.3 LV SV:         74 LV SV Index:   37 LVOT Area:     3.14 cm  RIGHT VENTRICLE RV S prime:     23.00 cm/s TAPSE (M-mode): 2.8 cm LEFT ATRIUM             Index LA Vol (A2C):   45.8 ml 22.51 ml/m LA Vol (A4C):   88.1 ml 43.31 ml/m LA Biplane Vol: 64.3 ml 31.61 ml/m  AORTIC VALVE AV Area (Vmax):    1.85 cm AV Area (Vmean):   1.73 cm AV Area (VTI):     2.09 cm AV Vmax:           192.00 cm/s AV Vmean:  164.000 cm/s AV VTI:            0.356 m AV Peak Grad:      14.7 mmHg AV Mean Grad:      11.0 mmHg LVOT Vmax:         113.00 cm/s LVOT Vmean:        90.400 cm/s LVOT VTI:          0.237 m LVOT/AV VTI ratio: 0.67  AORTA Ao Root diam: 4.20 cm MITRAL VALVE               TRICUSPID VALVE MV Area (PHT): 3.27 cm    TR Peak grad:   35.0 mmHg MV Decel Time: 232 msec    TR Vmax:        296.00 cm/s MV E velocity: 83.60 cm/s MV A velocity: 84.00 cm/s  SHUNTS MV E/A ratio:  1.00        Systemic  VTI:  0.24 m                            Systemic Diam: 2.00 cm Vishnu Priya Mallipeddi Electronically signed by Lucetta Russel Mallipeddi Signature Date/Time: 05/26/2023/11:18:32 AM    Final    DG Chest Portable 1 View Result Date: 05/26/2023 CLINICAL DATA:  Increased heart rate and shortness of breath. EXAM: PORTABLE CHEST 1 VIEW COMPARISON:  May 25, 2023 FINDINGS: Multiple sternal wires are noted. Cardiac silhouette is mildly enlarged and unchanged in size. Low lung volumes are seen. Mild atelectasis is present within the bilateral lung bases. No pleural effusion or pneumothorax is identified. Multilevel degenerative changes are seen throughout the thoracic spine. IMPRESSION: 1. Evidence of prior median sternotomy. 2. Low lung volumes with mild bibasilar atelectasis. Electronically Signed   By: Virgle Grime M.D.   On: 05/26/2023 01:14   DG Chest Portable 1 View Result Date: 05/25/2023 CLINICAL DATA:  Cough, sepsis EXAM: PORTABLE CHEST 1 VIEW COMPARISON:  07/18/2021 FINDINGS: Single frontal view of the chest demonstrates stable postsurgical changes from median sternotomy. The cardiac silhouette is enlarged. Lung volumes are diminished, with crowding of the central vasculature. No airspace disease, effusion, or pneumothorax. IMPRESSION: 1. Low lung volumes.  No acute airspace disease. Electronically Signed   By: Bobbye Burrow M.D.   On: 05/25/2023 21:52    Scheduled Meds:  insulin  aspart  0-15 Units Subcutaneous TID WC   insulin  aspart  0-5 Units Subcutaneous QHS   melatonin  5 mg Oral QHS   tamsulosin   0.4 mg Oral Daily   Continuous Infusions:  ceFEPime  (MAXIPIME ) IV 2 g (05/27/23 0457)   diltiazem  (CARDIZEM ) infusion 5 mg/hr (05/27/23 1002)   heparin  1,200 Units/hr (05/27/23 0814)   lactated ringers  20 mL/hr at 05/27/23 0217   lactated ringers  75 mL/hr at 05/27/23 1007   metronidazole  500 mg (05/27/23 1017)   vancomycin        LOS: 1 day   Leona Rake, MD [facility] P2/01/2024, 10:18 AM

## 2023-05-28 ENCOUNTER — Other Ambulatory Visit: Payer: Self-pay | Admitting: Radiology

## 2023-05-28 ENCOUNTER — Inpatient Hospital Stay (HOSPITAL_COMMUNITY): Payer: Medicare Other

## 2023-05-28 DIAGNOSIS — I480 Paroxysmal atrial fibrillation: Secondary | ICD-10-CM | POA: Diagnosis not present

## 2023-05-28 DIAGNOSIS — Z79899 Other long term (current) drug therapy: Secondary | ICD-10-CM | POA: Diagnosis not present

## 2023-05-28 DIAGNOSIS — A419 Sepsis, unspecified organism: Secondary | ICD-10-CM | POA: Diagnosis not present

## 2023-05-28 HISTORY — PX: IR PERC CHOLECYSTOSTOMY: IMG2326

## 2023-05-28 HISTORY — DX: Paroxysmal atrial fibrillation: I48.0

## 2023-05-28 LAB — COMPREHENSIVE METABOLIC PANEL
ALT: 27 U/L (ref 0–44)
AST: 35 U/L (ref 15–41)
Albumin: 2.2 g/dL — ABNORMAL LOW (ref 3.5–5.0)
Alkaline Phosphatase: 59 U/L (ref 38–126)
Anion gap: 10 (ref 5–15)
BUN: 17 mg/dL (ref 8–23)
CO2: 19 mmol/L — ABNORMAL LOW (ref 22–32)
Calcium: 7.9 mg/dL — ABNORMAL LOW (ref 8.9–10.3)
Chloride: 100 mmol/L (ref 98–111)
Creatinine, Ser: 0.75 mg/dL (ref 0.61–1.24)
GFR, Estimated: >60 mL/min (ref >60)
Glucose, Bld: 149 mg/dL — ABNORMAL HIGH (ref 70–99)
Potassium: 4.2 mmol/L (ref 3.5–5.1)
Sodium: 129 mmol/L — ABNORMAL LOW (ref 135–145)
Total Bilirubin: 0.9 mg/dL (ref 0.0–1.2)
Total Protein: 5.3 g/dL — ABNORMAL LOW (ref 6.5–8.1)

## 2023-05-28 LAB — CBC
HCT: 35.8 % — ABNORMAL LOW (ref 39.0–52.0)
Hemoglobin: 11.7 g/dL — ABNORMAL LOW (ref 13.0–17.0)
MCH: 30.3 pg (ref 26.0–34.0)
MCHC: 32.7 g/dL (ref 30.0–36.0)
MCV: 92.7 fL (ref 80.0–100.0)
Platelets: 189 10*3/uL (ref 150–400)
RBC: 3.86 MIL/uL — ABNORMAL LOW (ref 4.22–5.81)
RDW: 13.6 % (ref 11.5–15.5)
WBC: 11 10*3/uL — ABNORMAL HIGH (ref 4.0–10.5)
nRBC: 0 % (ref 0.0–0.2)

## 2023-05-28 LAB — GLUCOSE, CAPILLARY
Glucose-Capillary: 143 mg/dL — ABNORMAL HIGH (ref 70–99)
Glucose-Capillary: 152 mg/dL — ABNORMAL HIGH (ref 70–99)
Glucose-Capillary: 143 mg/dL — ABNORMAL HIGH (ref 70–99)
Glucose-Capillary: 167 mg/dL — ABNORMAL HIGH (ref 70–99)

## 2023-05-28 LAB — CULTURE, BLOOD (ROUTINE X 2): Culture  Setup Time: NO GROWTH

## 2023-05-28 LAB — HEPARIN LEVEL (UNFRACTIONATED): Heparin Unfractionated: 0.3 [IU]/mL (ref 0.30–0.70)

## 2023-05-28 MED ORDER — LIDOCAINE-EPINEPHRINE 1 %-1:100000 IJ SOLN
INTRAMUSCULAR | Status: AC
  Filled 2023-05-28: qty 1

## 2023-05-28 MED ORDER — FENTANYL CITRATE (PF) 100 MCG/2ML IJ SOLN
INTRAMUSCULAR | Status: AC | PRN
  Administered 2023-05-28 (×3): 25 ug via INTRAVENOUS
  Administered 2023-05-28: 50 ug via INTRAVENOUS
  Administered 2023-05-28: 25 ug via INTRAVENOUS

## 2023-05-28 MED ORDER — FENTANYL CITRATE (PF) 100 MCG/2ML IJ SOLN
INTRAMUSCULAR | Status: AC
  Filled 2023-05-28: qty 2

## 2023-05-28 MED ORDER — HYDROMORPHONE HCL 1 MG/ML IJ SOLN
0.5000 mg | INTRAMUSCULAR | Status: DC | PRN
  Administered 2023-05-28 – 2023-05-29 (×2): 0.5 mg via INTRAVENOUS
  Filled 2023-05-28 (×2): qty 1

## 2023-05-28 MED ORDER — MIDAZOLAM HCL 2 MG/2ML IJ SOLN
INTRAMUSCULAR | Status: AC
  Filled 2023-05-28: qty 2

## 2023-05-28 MED ORDER — MEPERIDINE HCL 100 MG/ML IJ SOLN
INTRAMUSCULAR | Status: AC
  Filled 2023-05-28: qty 1

## 2023-05-28 MED ORDER — SODIUM CHLORIDE 0.9% FLUSH
5.0000 mL | Freq: Three times a day (TID) | INTRAVENOUS | Status: DC
  Administered 2023-05-28 – 2023-06-03 (×18): 5 mL

## 2023-05-28 MED ORDER — MIDAZOLAM HCL 2 MG/2ML IJ SOLN
INTRAMUSCULAR | Status: AC | PRN
  Administered 2023-05-28: .5 mg via INTRAVENOUS
  Administered 2023-05-28: 1 mg via INTRAVENOUS
  Administered 2023-05-28: .5 mg via INTRAVENOUS

## 2023-05-28 MED ORDER — HYDROMORPHONE HCL 1 MG/ML IJ SOLN
1.0000 mg | Freq: Once | INTRAMUSCULAR | Status: AC
  Administered 2023-05-28: 1 mg via INTRAVENOUS
  Filled 2023-05-28: qty 1

## 2023-05-28 MED ORDER — SODIUM CHLORIDE 0.9 % IV SOLN: INTRAVENOUS | Status: DC

## 2023-05-28 MED ORDER — HEPARIN (PORCINE) 25000 UT/250ML-% IV SOLN
1700.0000 [IU]/h | INTRAVENOUS | Status: DC
  Administered 2023-05-28 – 2023-05-29 (×2): 1550 [IU]/h via INTRAVENOUS
  Filled 2023-05-28: qty 250

## 2023-05-28 MED ORDER — HALOPERIDOL LACTATE 5 MG/ML IJ SOLN: 2.0000 mg | Freq: Four times a day (QID) | INTRAMUSCULAR | Status: DC | PRN

## 2023-05-28 MED ORDER — OXYCODONE HCL 5 MG PO TABS
5.0000 mg | ORAL_TABLET | Freq: Four times a day (QID) | ORAL | Status: DC | PRN
  Administered 2023-05-28 – 2023-06-01 (×3): 5 mg via ORAL
  Filled 2023-05-28 (×3): qty 1

## 2023-05-28 MED ORDER — LIDOCAINE-EPINEPHRINE 1 %-1:100000 IJ SOLN
20.0000 mL | Freq: Once | INTRAMUSCULAR | Status: AC
  Administered 2023-05-28: 10 mL via INTRADERMAL

## 2023-05-28 MED ORDER — PIPERACILLIN-TAZOBACTAM 3.375 G IVPB
3.3750 g | Freq: Three times a day (TID) | INTRAVENOUS | Status: DC
  Administered 2023-05-28 – 2023-05-30 (×6): 3.375 g via INTRAVENOUS
  Filled 2023-05-28 (×6): qty 50

## 2023-05-28 MED ORDER — IOHEXOL 300 MG/ML  SOLN
50.0000 mL | Freq: Once | INTRAMUSCULAR | Status: AC | PRN
Start: 2023-05-28 — End: 2023-05-28
  Administered 2023-05-28: 20 mL

## 2023-05-28 MED ORDER — QUETIAPINE FUMARATE 50 MG PO TABS: 25.0000 mg | ORAL_TABLET | Freq: Every day | ORAL | Status: DC

## 2023-05-28 NOTE — TOC Initial Note (Signed)
Transition of Care [facility]) - Initial/Assessment Note   Patient Details  Name: Jonathan Owen MRN: [id number] Date of Birth: [birth year]  Transition of Care [facility]) CM/SW Contact:    Ewing Schlein, LCSW Phone Number: 05/28/2023, 9:07 AM  Clinical Narrative: Patient is from home alone. PT consulted. TOC awaiting recommendations.  Expected Discharge Plan:  (TBD) Barriers to Discharge: Continued Medical Work up  Expected Discharge Plan and Services In-house Referral: Clinical Social Work Living arrangements for the past 2 months: Single Family Home  Prior Living Arrangements/Services Living arrangements for the past 2 months: Single Family Home Lives with:: Self Patient language and need for interpreter reviewed:: Yes Do you feel safe going back to the place where you live?: Yes      Need for Family Participation in Patient Care: No (Comment) Care giver support system in place?: Yes (comment) Criminal Activity/Legal Involvement Pertinent to Current Situation/Hospitalization: No - Comment as needed  Activities of Daily Living ADL Screening (condition at time of admission) Independently performs ADLs?: Yes (appropriate for developmental age) Is the patient deaf or have difficulty hearing?: No Does the patient have difficulty seeing, even when wearing glasses/contacts?: No Does the patient have difficulty concentrating, remembering, or making decisions?: No  Emotional Assessment Orientation: : Oriented to Self, Oriented to Place, Oriented to  Time Alcohol / Substance Use: Not Applicable Psych Involvement: No (comment)  Admission diagnosis:  Sepsis (HCC) [A41.9] Sepsis, due to unspecified organism, unspecified whether acute organ dysfunction present [facility]) [A41.9] Patient Active Problem List   Diagnosis Date Noted   Hyponatremia 05/26/2023   Chronic atrial fibrillation with RVR (HCC) 05/26/2023   Sepsis (HCC) 05/26/2023   Atrial fibrillation with RVR (HCC) 05/26/2023   Spinal  stenosis of lumbar region with neurogenic claudication 09/13/2022   Age-related osteoporosis without current pathological fracture 09/19/2020   Low back pain 03/22/2020   Displaced intertrochanteric fracture of left femur, subsequent encounter for closed fracture with routine healing 07/21/2018   History of malignant neoplasm of colon 07/21/2018   HTN (hypertension) 07/15/2018   Type 2 diabetes mellitus without complication (HCC) 08/01/2015   Breast mass in male 11/13/2012   PCP:  Noni Saupe, MD Pharmacy:   [facility] [phone] - [place], [place] - [address] [place] [address] Phone: [phone] Fax: [phone]  Social Drivers of Health (SDOH) Social History: SDOH Screenings   Food Insecurity: No Food Insecurity (05/26/2023)  Housing: Low Risk  (05/26/2023)  Transportation Needs: No Transportation Needs (05/26/2023)  Utilities: Not At Risk (05/26/2023)  Social Connections: Unknown (05/26/2023)  Tobacco Use: Low Risk  (05/25/2023)   SDOH Interventions:    Readmission Risk Interventions     No data to display

## 2023-05-28 NOTE — Progress Notes (Signed)
PHARMACY - ANTICOAGULATION CONSULT NOTE  Pharmacy Consult for heparin Indication: atrial fibrillation  Allergies  Allergen Reactions   Tape Other (See Comments)    Unknown    Patient Measurements: Height: 5\' 11"  (180.3 cm) Weight: 97.6 kg (215 lb 2.7 oz) IBW/kg (Calculated) : 75.3 Heparin Dosing Weight: 84 kg  Vital Signs: Temp: 98.4 F (36.9 C) (02/10 2350) Temp Source: Oral (02/10 2350) BP: 96/47 (02/11 0400) Pulse Rate: 93 (02/11 0400)  Labs: Recent Labs    05/25/23 2055 05/25/23 2130 05/26/23 0500 05/26/23 1038 05/27/23 0512 05/27/23 1818 05/28/23 0330  HGB  --   --  11.8*  --  10.6*  --  11.7*  HCT  --   --  35.2*  --  32.9*  --  35.8*  PLT  --   --  259  --  176  --  189  APTT 25  --   --   --   --   --   --   LABPROT 15.2  --  15.7*  --   --   --   --   INR 1.2  --  1.2  --   --   --   --   HEPARINUNFRC  --   --   --  <0.10*  --  <0.10* 0.30  CREATININE  --    < > 0.74  --  0.68  --  0.75   < > = values in this interval not displayed.    Estimated Creatinine Clearance: 68.7 mL/min (by C-G formula based on SCr of 0.75 mg/dL).   Medical History: Past Medical History:  Diagnosis Date   Arthritis    Cancer (HCC)    hx colon cancer   Diabetes mellitus without complication (HCC)    no meds-watches diet   Hypertension    no meds now-   Wears dentures    upper-partial bottom   Wears glasses      Assessment: 89 yo male with afib with RVR; later in sinus tachycardia. Heparin and diltiazem stopped as he had converted to NSR. Patient now back in afib and heparin to restart.  Pt has taken apixaban in the past, says he doesn't take anymore, no recent fill hx (daughter confirmed pt is no longer taking apixaban).  05/28/2023: Heparin level 0.3- now therapeutic at low end of goal range  on current heparin rate of 1450 units/hr No bleeding or infusion related concerns reported by RN CBC- Hg 11.7- low/stable, pltc 189- WNL.  Goal of Therapy:  Heparin  level 0.3-0.7 units/ml Monitor platelets by anticoagulation protocol: Yes   Plan:  -Increase heparin infusion slightly to 1550 units/hr -Check heparin level in 8 hours -Daily CBC  Junita Push, PharmD, BCPS 05/28/2023 4:18 AM

## 2023-05-28 NOTE — Progress Notes (Signed)
PHARMACY - ANTICOAGULATION CONSULT NOTE  Pharmacy Consult for heparin  Indication: new onset  atrial fibrillation  Allergies  Allergen Reactions   Tape Other (See Comments)    Unknown    Patient Measurements: Height: 5\' 11"  (180.3 cm) Weight: 97.6 kg (215 lb 2.7 oz) IBW/kg (Calculated) : 75.3 Heparin Dosing Weight: 95 kg  Vital Signs: Temp: 98.4 F (36.9 C) (02/11 0800) Temp Source: Oral (02/11 0800) BP: 141/83 (02/11 1240) Pulse Rate: 107 (02/11 1240)  Labs: Recent Labs    05/25/23 2055 05/25/23 2130 05/26/23 0500 05/26/23 1038 05/27/23 0512 05/27/23 1818 05/28/23 0330  HGB  --   --  11.8*  --  10.6*  --  11.7*  HCT  --   --  35.2*  --  32.9*  --  35.8*  PLT  --   --  259  --  176  --  189  APTT 25  --   --   --   --   --   --   LABPROT 15.2  --  15.7*  --   --   --   --   INR 1.2  --  1.2  --   --   --   --   HEPARINUNFRC  --   --   --  <0.10*  --  <0.10* 0.30  CREATININE  --    < > 0.74  --  0.68  --  0.75   < > = values in this interval not displayed.    Estimated Creatinine Clearance: 68.7 mL/min (by C-G formula based on SCr of 0.75 mg/dL).   Medical History: Past Medical History:  Diagnosis Date   Arthritis    Cancer (HCC)    hx colon cancer   Diabetes mellitus without complication (HCC)    no meds-watches diet   Hypertension    no meds now-   Wears dentures    upper-partial bottom   Wears glasses     Assessment: Patient is a 89 y.o M who presented to the ED on 05/25/23 with c/o generalized weakness and SOB. He was subsequently found to have acute cholecystitis and afib with RVR.  Heparin drip started on 05/28/23 for afib. He underwent perc chole tube placement on 05/28/23 with heparin drip d/ced at 9:22a for procedure. Per Dr. Lytle Butte via [facility] msg, ok to resume heparin drip back 6 hrs post procedure.   - patient had procedure at ~1p  on 2/11  Goal of Therapy:  Heparin level 0.3-0.7 units/ml Monitor platelets by anticoagulation protocol: Yes    Plan:  - resume heparin drip back at 1550 units/hr at 7p tonight - check 8 hr heparin level  - monitor for s/sx bleeding   Lion Fernandez P 05/28/2023,1:47 PM

## 2023-05-28 NOTE — Progress Notes (Signed)
Progress Note  Patient Name: Jonathan Owen Date of Encounter: 05/28/2023  Primary Cardiologist: None   Subjective   Patient seen and examined at his bedside.   Inpatient Medications    Scheduled Meds:  Chlorhexidine Gluconate Cloth  6 each Topical Daily   insulin aspart  0-15 Units Subcutaneous TID WC   insulin aspart  0-5 Units Subcutaneous QHS   melatonin  5 mg Oral QHS   tamsulosin  0.4 mg Oral Daily   Continuous Infusions:  diltiazem (CARDIZEM) infusion 10 mg/hr (05/28/23 0600)   heparin 1,550 Units/hr (05/28/23 0600)   PRN Meds: acetaminophen **OR** acetaminophen, clonazePAM, ondansetron **OR** ondansetron (ZOFRAN) IV, mouth rinse   Vital Signs    Vitals:   05/28/23 0300 05/28/23 0400 05/28/23 0500 05/28/23 0800  BP: (!) 112/53 (!) 96/47 (!) 113/48   Pulse: 87 93 75   Resp: (!) 25 (!) 27 (!) 21   Temp:  98.2 F (36.8 C)  98.4 F (36.9 C)  TempSrc:  Oral  Oral  SpO2: 98% 98% 99%   Weight:      Height:        Intake/Output Summary (Last 24 hours) at 05/28/2023 1047 Last data filed at 05/28/2023 0600 Gross per 24 hour  Intake 3254.22 ml  Output 800 ml  Net 2454.22 ml   Filed Weights   05/26/23 0000 05/27/23 1156  Weight: 93.2 kg 97.6 kg    Telemetry    Atrial fibrillation with controlled ventricular rate- Personally Reviewed  ECG     - Personally Reviewed  Physical Exam     General: Comfortable, sitting up in a chair Head: Atraumatic, normal size  Eyes: PEERLA, EOMI  Neck: Supple, normal JVD Cardiac: Normal S1, S2; RRR; no murmurs, rubs, or gallops Lungs: Clear to auscultation bilaterally Abd: Soft, nontender, no hepatomegaly  Ext: warm, no edema Musculoskeletal: No deformities, BUE and BLE strength normal and equal Skin: Warm and dry, no rashes   Neuro: Alert and oriented to person, place, time, and situation, CNII-XII grossly intact, no focal deficits  Psych: Normal mood and affect   Labs    Chemistry Recent Labs  Lab  05/26/23 0500 05/27/23 0512 05/28/23 0330  NA 127* 128* 129*  K 4.0 4.0 4.2  CL 97* 98 100  CO2 22 21* 19*  GLUCOSE 324* 148* 149*  BUN 11 12 17   CREATININE 0.74 0.68 0.75  CALCIUM 8.5* 8.2* 7.9*  PROT 5.8* 5.5* 5.3*  ALBUMIN 2.6* 2.4* 2.2*  AST 17 50* 35  ALT 12 25 27   ALKPHOS 54 55 59  BILITOT 1.8* 1.1 0.9  GFRNONAA >60 >60 >60  ANIONGAP 8 9 10      Hematology Recent Labs  Lab 05/26/23 0500 05/27/23 0512 05/28/23 0330  WBC 18.0* 12.3* 11.0*  RBC 3.89* 3.62* 3.86*  HGB 11.8* 10.6* 11.7*  HCT 35.2* 32.9* 35.8*  MCV 90.5 90.9 92.7  MCH 30.3 29.3 30.3  MCHC 33.5 32.2 32.7  RDW 12.8 13.2 13.6  PLT 259 176 189    Cardiac EnzymesNo results for input(s): "TROPONINI" in the last 168 hours. No results for input(s): "TROPIPOC" in the last 168 hours.   BNPNo results for input(s): "BNP", "PROBNP" in the last 168 hours.   DDimer No results for input(s): "DDIMER" in the last 168 hours.   Radiology    NM Hepatobiliary Liver Func Result Date: 05/27/2023 CLINICAL DATA:  Sepsis, cholecystitis on prior ultrasound and CT EXAM: NUCLEAR MEDICINE HEPATOBILIARY IMAGING TECHNIQUE: Sequential images of the  abdomen were obtained out to 60 minutes following intravenous administration of radiopharmaceutical. RADIOPHARMACEUTICALS:  4.97 mCi Tc-44m Choletec IV, 3 mg morphine IV COMPARISON:  None Available. FINDINGS: Physiologic uptake of radiotracer is seen throughout the hepatic parenchyma. Normal emptying through the common bile duct into the small bowel. The gallbladder is not identified at 60 minutes. Morphine was administered, with no visualization of the gallbladder after 30 additional minutes of imaging. IMPRESSION: 1. Nonvisualization of the gallbladder despite morphine administration, consistent with acute cholecystitis. Electronically Signed   By: Sharlet Salina M.D.   On: 05/27/2023 18:16   CT ABDOMEN PELVIS W CONTRAST Result Date: 05/26/2023 CLINICAL DATA:  Sepsis . Pt was very  agitated and fidgety through the entire exam. Best images attainable. EXAM: CT ABDOMEN AND PELVIS WITH CONTRAST TECHNIQUE: Multidetector CT imaging of the abdomen and pelvis was performed using the standard protocol following bolus administration of intravenous contrast. RADIATION DOSE REDUCTION: This exam was performed according to the departmental dose-optimization program which includes automated exposure control, adjustment of the mA and/or kV according to patient size and/or use of iterative reconstruction technique. CONTRAST:  OMNIPAQUE IOHEXOL 300 MG/ML  SOLN COMPARISON:  X-ray lumbar 03/19/2022, CT abdomen pelvis 11/10/2007 FINDINGS: Lower chest: Trace right pleural effusion. Hepatobiliary: Limited evaluation due to motion artifact with gallbladder wall thickening and pericholecystic fat stranding. Similar-appearing hypodensity of the left hepatic lobe likely a simple hepatic cyst (2:22). No CT evidence of gallstones. No pericholecystic fluid. No biliary dilatation. Pancreas: No focal lesion. Normal pancreatic contour. No surrounding inflammatory changes. No main pancreatic ductal dilatation. Spleen: Normal in size without focal abnormality. Adrenals/Urinary Tract: No adrenal nodule bilaterally. Bilateral kidneys enhance symmetrically. Parapelvic simple cysts of the left kidney. Simple renal cysts, in the absence of clinically indicated signs/symptoms, require no independent follow-up. No hydronephrosis. No hydroureter.  No nephroureterolithiasis. The urinary bladder is unremarkable. Minimal excretion of intravenous contrast of either kidneys on delayed view. Stomach/Bowel: Stomach is within normal limits. No evidence of bowel wall thickening or dilatation. Third portion of the duodenum diverticula. Appendix appears normal. Vascular/Lymphatic: No abdominal aorta or iliac aneurysm. Severe atherosclerotic plaque of the aorta and its branches. No abdominal, pelvic, or inguinal lymphadenopathy.  Reproductive: Prostate is unremarkable. Other: No intraperitoneal free fluid. No intraperitoneal free gas. No organized fluid collection. Musculoskeletal: No abdominal wall hernia or abnormality. No suspicious lytic or blastic osseous lesions. No acute displaced fracture. Chronic severe L1 compression fracture. Multilevel severe degenerative changes of the spine. Partially visualized left femur intramedullary nail fixation. IMPRESSION: 1. Gallbladder wall thickening and pericholecystic fat stranding. Question acute cholecystitis. Finding better evaluated on ultrasound abdomen 05/26/2023. 2. Trace right pleural effusion. 3. Third portion of the duodenum diverticula. Electronically Signed   By: Tish Frederickson M.D.   On: 05/26/2023 22:24   US Abdomen Limited RUQ (LIVER/GB) Result Date: 05/26/2023 CLINICAL DATA:  Fever.  Abnormal gallbladder on chest CT. EXAM: ULTRASOUND ABDOMEN LIMITED RIGHT UPPER QUADRANT COMPARISON:  CT chest from same day. CT abdomen pelvis dated November 10, 2007. FINDINGS: Gallbladder: Distended with sludge. Moderate wall thickening. No gallstones. No sonographic Murphy sign noted by sonographer. Common bile duct: Not visualized, but nondilated on earlier CT. Liver: No focal lesion identified. Within normal limits in parenchymal echogenicity. Portal vein is patent on color Doppler imaging with normal direction of blood flow towards the liver. Other: None. IMPRESSION: 1. Distended gallbladder with sludge and moderate wall thickening. No gallstones. Findings are equivocal for acute cholecystitis. Consider further evaluation with nuclear medicine hepatobiliary scan.  Electronically Signed   By: Obie Dredge M.D.   On: 05/26/2023 15:30   CT CHEST WO CONTRAST Result Date: 05/26/2023 CLINICAL DATA:  Shortness of breath. Cold symptoms for the past few weeks. EXAM: CT CHEST WITHOUT CONTRAST TECHNIQUE: Multidetector CT imaging of the chest was performed following the standard protocol without IV  contrast. RADIATION DOSE REDUCTION: This exam was performed according to the departmental dose-optimization program which includes automated exposure control, adjustment of the mA and/or kV according to patient size and/or use of iterative reconstruction technique. COMPARISON:  Chest x-ray from same day. CT chest dated February 04, 2021. FINDINGS: Cardiovascular: No significant vascular findings. Normal heart size. No pericardial effusion. No thoracic aortic aneurysm. Coronary, aortic arch, and branch vessel atherosclerotic vascular disease. Mediastinum/Nodes: No enlarged mediastinal or axillary lymph nodes. Thyroid gland, trachea, and esophagus demonstrate no significant findings. Lungs/Pleura: Bibasilar subsegmental atelectasis. No focal consolidation, pleural effusion, or pneumothorax. Upper Abdomen: Distended gallbladder with mild wall thickening and surrounding inflammatory change. Musculoskeletal: No chest wall mass or suspicious bone lesions identified. IMPRESSION: 1. No acute intrathoracic process. 2. Distended gallbladder with mild wall thickening and surrounding inflammatory change, concerning for acute cholecystitis. Correlate with right upper quadrant pain and consider further evaluation with right upper quadrant ultrasound. 3.  Aortic Atherosclerosis (ICD10-I70.0). Electronically Signed   By: Obie Dredge M.D.   On: 05/26/2023 13:29    Cardiac Studies   Echo reviewed  Patient Profile     89 y.o. male with history of diabetes, hypertension, colon cancer, osteoarthritis and A-fib RVR while being treated for sepsis  Assessment & Plan    Atrial fibrillation with rapid ventricular rate-rate has been controlled he is on Cardizem drip as well as heparin drip. CHADS2 Vasc score  is 5 which will indicate anticoagulation for stroke prevention-will convert the patient from heparin to Eliquis dose is 5 mg twice daily.  Spoke with the patient and his daughter, he may not want to be on  anticoagulation at the time of discharge.  The daughter is very apprehensive about it.  Discussed risk and benefit with them.  Will also place a monitor him at the time of discharge.  I expect his rhythm to continue to maintain control once his infection continue to resolve.  Diabetes mellitus per primary team.  Blood pressure stable.     For questions or updates, please contact [facility] Please consult [email or web address] for contact info under Cardiology/STEMI.      Osvaldo Shipper, DO  05/28/2023, 10:47 AM

## 2023-05-28 NOTE — Progress Notes (Signed)
Pharmacy Antibiotic Note  Jonathan Owen is a 89 y.o. male who is currently on cefepime and flagyl for acute cholecystitis with plan for perc chole tube placement by IR on 05/28/23. Pharmacy has been consulted on 05/28/23 to change abx to zosyn.  Today, 05/28/2023: - day #3 abx - -scr 0.75 (crcl~69)  Plan: - zosyn 3.375 gm IV q8h (infuse over 4 hrs) - With stable renal function, pharmacy will sign off for abx consult.  Reconsult [place] if need further assistance.  ____________________________________________  Height: 5\' 11"  (180.3 cm) Weight: 97.6 kg (215 lb 2.7 oz) IBW/kg (Calculated) : 75.3  Temp (24hrs), Avg:98.3 F (36.8 C), Min:98.1 F (36.7 C), Max:98.7 F (37.1 C)  Recent Labs  Lab 05/25/23 2042 05/25/23 2054 05/25/23 2130 05/25/23 2233 05/26/23 0500 05/27/23 0512 05/27/23 1829 05/27/23 2312 05/28/23 0330  WBC 16.3*  --   --   --  18.0* 12.3*  --   --  11.0*  CREATININE  --   --  0.81  --  0.74 0.68  --   --  0.75  LATICACIDVEN  --  2.2*  --  2.0*  --   --  1.5 1.2  --     Estimated Creatinine Clearance: 68.7 mL/min (by C-G formula based on SCr of 0.75 mg/dL).    Allergies  Allergen Reactions   Tape Other (See Comments)    Unknown     Thank you for allowing pharmacy to be a part of this patient's care.  Lucia Gaskins 05/28/2023 10:48 AM

## 2023-05-28 NOTE — Evaluation (Signed)
Physical Therapy Evaluation Patient Details Name: Jonathan Owen MRN: [id number] DOB: 04/24/1927 Today's Date: 05/28/2023  History of Present Illness  89 yo  male  who presented with SOB, fever, chills, & weakness. pt was tachycardiac, febrile. Suspected  sepsis of unclear etiology. CT chest negative. pt  went into A-fib with RVR in the ED. US of the abdomen/CT abdomen/pelvis showed acute cholecystitis. HIDA positive. General surgery, cardiology consulted. IR planning for percutaneous cholecystostomy drain 05/28/23.   PMH: insulin-dependent diabetes type 2, hypertension, colon cancer, osteoarthritis, prior PE  Clinical Impression  Pt admitted with above diagnosis.  Pt is independent at his baseline, amb with cane, lives alone. Pt presents today with limited activity tolerance, global deconditioning and decr ability to perform functional tasks.  Pt puts forth good effort during PT session however is limited by fatigue/weakness. Dtr present, supportive.   VS: resting BP 114/60-->HR 100-->RR 21-->SpO2=100% 1L Activity BP 139/64-->HR 122-->RR 29-->SpO=97% 1L Pt with continuing afib, rate controlled per cardiology Post activity BP 117/64 (pt reported dizziness after return to supine, resolved in less than one min)   Pt currently with functional limitations due to the deficits listed below (see PT Problem List). Pt will benefit from acute skilled PT to increase their independence and safety with mobility to allow discharge.           If plan is discharge home, recommend the following: A lot of help with walking and/or transfers;A lot of help with bathing/dressing/bathroom;Assistance with cooking/housework;Assist for transportation;Help with stairs or ramp for entrance   Can travel by private vehicle   Yes    Equipment Recommendations Other (comment) (defer to SNF; RW if home)  Recommendations for Other Services       Functional Status Assessment Patient has had a recent decline in their  functional status and demonstrates the ability to make significant improvements in function in a reasonable and predictable amount of time.     Precautions / Restrictions Precautions Precautions: Fall Restrictions Weight Bearing Restrictions Per Provider Order: No      Mobility  Bed Mobility Overal bed mobility: Needs Assistance Bed Mobility: Supine to Sit, Sit to Supine     Supine to sit: Mod assist, +2 for physical assistance, +2 for safety/equipment Sit to supine: +2 for safety/equipment, +2 for physical assistance, Mod assist   General bed mobility comments: assist to progress LEs on/off bed and elevate/control descent of  trunk; good pt effort to self assist    Transfers Overall transfer level: Needs assistance Equipment used: Rolling walker (2 wheels) Transfers: Sit to/from Stand Sit to Stand: Mod assist, +2 safety/equipment, +2 physical assistance           General transfer comment: assist to safely rise and transition to RW. LEs weak, shaky with intial standing. 2nd trial improved stability and pt able to take lateral steps to along EOB with min assist of 2 for stabilty and line management; reported dizziness after sitting EOB for~ 3 minutes, BP 139/64    Ambulation/Gait               General Gait Details: NT  Stairs            Wheelchair Mobility     Tilt Bed    Modified Rankin (Stroke Patients Only)       Balance Overall balance assessment: Needs assistance Sitting-balance support: Feet unsupported, Single extremity supported, No upper extremity supported Sitting balance-Leahy Scale: Fair     Standing balance support: During functional activity, Reliant on  assistive device for balance Standing balance-Leahy Scale: Poor Standing balance comment: reliant on  device and external assist                             Pertinent Vitals/Pain Pain Assessment Pain Assessment: No/denies pain    Home Living Family/patient expects  to be discharged to:: Private residence Living Arrangements: Alone Available Help at Discharge: Available PRN/intermittently Type of Home: House       Alternate Level Stairs-Number of Steps: has chair lift Home Layout: Two level Home Equipment: Cane - single point Additional Comments: dtr present for session, pt reports he is very active and IND at baseline, dtr confirms. pt has girlfriend of 4 years and dtr that can assist/provide supervision intermitently    Prior Function Prior Level of Function : Independent/Modified Independent             Mobility Comments: amb with cane       Extremity/Trunk Assessment   Upper Extremity Assessment Upper Extremity Assessment: Defer to OT evaluation;Generalized weakness    Lower Extremity Assessment Lower Extremity Assessment: Generalized weakness       Communication   Communication Communication: No apparent difficulties    Cognition Arousal: Alert Behavior During Therapy: WFL for tasks assessed/performed   PT - Cognitive impairments: No apparent impairments                       PT - Cognition Comments: dtr answers/confirms some of PLOF; pt is oriented to self, place, situation Following commands: Intact       Cueing Cueing Techniques: Verbal cues     General Comments      Exercises     Assessment/Plan    PT Assessment Patient needs continued PT services  PT Problem List Decreased strength;Decreased activity tolerance;Decreased balance;Decreased knowledge of use of DME;Decreased mobility;Cardiopulmonary status limiting activity       PT Treatment Interventions DME instruction;Therapeutic exercise;Gait training;Functional mobility training;Therapeutic activities;Patient/family education    PT Goals (Current goals can be found in the Care Plan section)  Acute Rehab PT Goals Patient Stated Goal: per dtr-rehab PT Goal Formulation: With patient/family Time For Goal Achievement: 06/11/23 Potential to  Achieve Goals: Good    Frequency Min 1X/week     Co-evaluation               AM-PAC PT "6 Clicks" Mobility  Outcome Measure Help needed turning from your back to your side while in a flat bed without using bedrails?: A Little Help needed moving from lying on your back to sitting on the side of a flat bed without using bedrails?: Total Help needed moving to and from a bed to a chair (including a wheelchair)?: Total Help needed standing up from a chair using your arms (e.g., wheelchair or bedside chair)?: Total Help needed to walk in hospital room?: Total Help needed climbing 3-5 steps with a railing? : Total 6 Click Score: 8    End of Session   Activity Tolerance: Patient limited by fatigue;Patient tolerated treatment well Patient left: in bed;with call bell/phone within reach;with family/visitor present Nurse Communication: Mobility status PT Visit Diagnosis: Other abnormalities of gait and mobility (R26.89);Unsteadiness on feet (R26.81);Muscle weakness (generalized) (M62.81)    Time: 1610-9604 PT Time Calculation (min) (ACUTE ONLY): 21 min   Charges:   PT Evaluation $PT Eval Low Complexity: 1 Low   PT General Charges $$ ACUTE PT VISIT: 1 Visit  Delice Bison, PT  Acute Rehab Dept [facility]) [phone]  05/28/2023   [facility] 05/28/2023, 11:53 AM

## 2023-05-28 NOTE — Progress Notes (Signed)
Progress Note: General Surgery Service   Chief Complaint/Subjective: Patient not interested in surgery.  Anticipating perc drain today.  Objective: Vital signs in last 24 hours: Temp:  [98.1 F (36.7 C)-98.7 F (37.1 C)] 98.4 F (36.9 C) (02/11 0800) Pulse Rate:  [75-130] 75 (02/11 0500) Resp:  [21-34] 21 (02/11 0500) BP: (93-141)/(41-93) 113/48 (02/11 0500) SpO2:  [96 %-100 %] 99 % (02/11 0500) Weight:  [97.6 kg] 97.6 kg (02/10 1156) Last BM Date :  (PTA)  Intake/Output from previous day: 02/10 0701 - 02/11 0700 In: 3254.2 [I.V.:2562.7; IV Piggyback:691.5] Out: 800 [Urine:800] Intake/Output this shift: No intake/output data recorded.  Constitutional: NAD; conversant; no deformities Eyes: Moist conjunctiva; no lid lag; anicteric; PERRL Neck: Trachea midline; no thyromegaly Lungs: Normal respiratory effort; no tactile fremitus CV: RRR; no palpable thrills; no pitting edema GI: Abd soft, nontender; no palpable hepatosplenomegaly MSK: Normal range of motion of extremities; no clubbing/cyanosis Psychiatric: Appropriate affect; alert and oriented x3 Lymphatic: No palpable cervical or axillary lymphadenopathy  Lab Results: CBC  Recent Labs    05/27/23 0512 05/28/23 0330  WBC 12.3* 11.0*  HGB 10.6* 11.7*  HCT 32.9* 35.8*  PLT 176 189   BMET Recent Labs    05/27/23 0512 05/28/23 0330  NA 128* 129*  K 4.0 4.2  CL 98 100  CO2 21* 19*  GLUCOSE 148* 149*  BUN 12 17  CREATININE 0.68 0.75  CALCIUM 8.2* 7.9*   PT/INR Recent Labs    05/25/23 2055 05/26/23 0500  LABPROT 15.2 15.7*  INR 1.2 1.2   ABG No results for input(s): "PHART", "HCO3" in the last 72 hours.  Invalid input(s): "PCO2", "PO2"  Anti-infectives: Anti-infectives (From admission, onward)    Start     Dose/Rate Route Frequency Ordered Stop   05/27/23 2200  vancomycin (VANCOCIN) IVPB 1000 mg/200 mL premix  Status:  Discontinued        1,000 mg 200 mL/hr over 60 Minutes Intravenous Every 24  hours 05/27/23 0724 05/27/23 1322   05/26/23 2200  vancomycin (VANCOREADY) IVPB 1750 mg/350 mL  Status:  Discontinued        1,750 mg 175 mL/hr over 120 Minutes Intravenous Every 24 hours 05/26/23 0050 05/27/23 0724   05/26/23 1000  metroNIDAZOLE (FLAGYL) IVPB 500 mg        500 mg 100 mL/hr over 60 Minutes Intravenous Every 12 hours 05/26/23 0415 06/02/23 0959   05/26/23 0415  ceFEPIme (MAXIPIME) 2 g in sodium chloride 0.9 % 100 mL IVPB  Status:  Discontinued        2 g 200 mL/hr over 30 Minutes Intravenous  Once 05/26/23 0411 05/26/23 0414   05/26/23 0415  metroNIDAZOLE (FLAGYL) IVPB 500 mg  Status:  Discontinued        500 mg 100 mL/hr over 60 Minutes Intravenous Every 12 hours 05/26/23 0411 05/26/23 0415   05/26/23 0415  vancomycin (VANCOCIN) IVPB 1000 mg/200 mL premix  Status:  Discontinued        1,000 mg 200 mL/hr over 60 Minutes Intravenous  Once 05/26/23 0411 05/26/23 0415   05/26/23 0400  ceFEPIme (MAXIPIME) 2 g in sodium chloride 0.9 % 100 mL IVPB        2 g 200 mL/hr over 30 Minutes Intravenous Every 8 hours 05/26/23 0050     05/25/23 2100  ceFEPIme (MAXIPIME) 2 g in sodium chloride 0.9 % 100 mL IVPB        2 g 200 mL/hr over 30 Minutes Intravenous  Once  05/25/23 2049 05/25/23 2137   05/25/23 2100  metroNIDAZOLE (FLAGYL) IVPB 500 mg        500 mg 100 mL/hr over 60 Minutes Intravenous  Once 05/25/23 2049 05/25/23 2312   05/25/23 2100  vancomycin (VANCOCIN) IVPB 1000 mg/200 mL premix        1,000 mg 200 mL/hr over 60 Minutes Intravenous  Once 05/25/23 2049 05/25/23 2207       Medications: Scheduled Meds:  Chlorhexidine Gluconate Cloth  6 each Topical Daily   insulin aspart  0-15 Units Subcutaneous TID WC   insulin aspart  0-5 Units Subcutaneous QHS   melatonin  5 mg Oral QHS   metoprolol tartrate  25 mg Oral BID   tamsulosin  0.4 mg Oral Daily   Continuous Infusions:  ceFEPime (MAXIPIME) IV Stopped (05/28/23 0437)   diltiazem (CARDIZEM) infusion 10 mg/hr (05/28/23  0600)   heparin 1,550 Units/hr (05/28/23 0600)   metronidazole Stopped (05/27/23 2259)   PRN Meds:.acetaminophen **OR** acetaminophen, clonazePAM, ondansetron **OR** ondansetron (ZOFRAN) IV, mouth rinse  Assessment/Plan: Jonathan Owen is a 89 year old gentleman with cholecystitis.  He is not a good candidate for anesthesia and plan is for IR drain placement.  If this drain is eventually removed, I would not plan on removing his gallbladder.  A new drain could be placed if symptoms recur.  The drain could also just stay in place indefinitely.  Discussed with patient who was in agreement with this plan. Surgery team will be available if needed.   LOS: 2 days   Jonathan Ore, MD  [facility] Use [email or web address] to contact on call provider  Daily Billing: [id number] - High MDM

## 2023-05-28 NOTE — Procedures (Signed)
Vascular and Interventional Radiology Procedure Note  Patient: Jonathan Owen DOB: September 18, 1927 Medical Record Number: [id number] Note Date/Time: 05/28/23 12:46 PM   Performing Physician: Roanna Banning, MD Assistant(s): None  Diagnosis: Acute cholecystitis  Procedure:  CHOLECYSTOSTOMY TUBE PLACEMENT ANTEROGRADE CHOLANGIOGRAM  Anesthesia: Conscious Sedation Complications: None Estimated Blood Loss: Minimal Specimens:  None  Findings:  Successful placement of 62F cholecystostomy tube.  Plan: Flush tube w 5 mL sterile NS q8h and record drain output qShift. Follow up for routine tube evaluation in 6-8 week(s).   See detailed procedure note with images in PACS. The patient tolerated the procedure well without incident or complication and was returned to Recovery in stable condition.    Roanna Banning, MD [facility]   Pager. [phone] Clinic. [phone]

## 2023-05-28 NOTE — Sedation Documentation (Addendum)
Bedside ICU RN bedside report given. Pt on Cardizem drip, has not needed titration of gtt since 0600. heparin gtt stopped at 930.

## 2023-05-28 NOTE — Progress Notes (Signed)
 PROGRESS NOTE  HURSCHEL PAYNTER  ZOX:[id number] DOB: 1927-11-29 DOA: 05/25/2023 PCP: Noni Saupe, MD   Brief Narrative: Patient is a 49 male with history of insulin-dependent diabetes type 2, hypertension, colon cancer, osteoarthritis, prior PE who presented with shortness of breath, fever, chills, weakness from home. He felt like being smothered. On presentation, he was tachycardiac, febrile.  Suspected to have sepsis of unclear etiology.  UA was not suspicoius  UTI.  Chest x-ray did not show any pneumonia.  Also went to A-fib with RVR in the emergency department.  Ultrasound of the abdomen/CT abdomen/pelvis showed acute cholecystitis. HIDA positive.  General surgery, cardiology consulted.  IR planning for percutaneous cholecystostomy drain  Assessment & Plan:  Principal Problem:   Sepsis (HCC) Active Problems:   HTN (hypertension)   Type 2 diabetes mellitus without complication (HCC)   Hyponatremia   Atrial fibrillation with RVR (HCC)   Sepsis: Presented with fever, leukocytosis, tachycardia.  Likely from abdominal cause.  He did not complain of severe abdominal pain on admission, right upper quadrant is mostly nontender.  Continue broad spectrum antibiotics,now on zosyn.  Follow-up cultures, anaerobic culture sets showed gram-positive cocci, most likely contamination.  Currently blood pressure stable.  Mildly elevated lactate level on presentation.  Has leukocytosis but now improving  Acute cholecystitis: Presented with fever.  Liver enzymes normal.  CT abdomen/pelvis, right upper quadrant ultrasound suggestive of acute cholecystitis.  HIDA positive.  General surgery consulted.  Currently n.p.o. General Surgery recommended IR guided drain placement.  IR planning for percutaneous cholecystostomy drain placement  New onset A-fib with RVR: Started on Cardizem drip.  This is most likely triggered from sepsis.  Started heparin drip.  Cardiology consulted and following  Insulin-dependent  diabetes type 2: Recent A1c of 6.1.  Takes insulin at home.  Continue sliding scale, long-acting insulin.  Monitor blood sugars  Hyponatremia: Continue gentle  IV fluid, improving  Hypertension: Currently BP stable.  Weakness: Patient lives alone.  Ambulatory at baseline.  Will consult PT when appropriate, likely tomorrow  Confusion: This is most likely hospital-acquired delirium.  Minimize sedatives, narcotics continue frequent reorientation, delirium precautions  Goals of care: Goals of care discharge with daughter [name].  She is expecting full recovery.  Patient is usually fully independent and healthy.  Patient remains full code.    Pressure Injury 05/27/23 Sacrum Medial Stage 1 -  Intact skin with non-blanchable redness of a localized area usually over a bony prominence. (Active)  05/27/23 1200  Location: Sacrum  Location Orientation: Medial  Staging: Stage 1 -  Intact skin with non-blanchable redness of a localized area usually over a bony prominence.  Wound Description (Comments):   Present on Admission: Yes  Dressing Type Foam - Lift dressing to assess site every shift 05/27/23 2000    DVT prophylaxis:iv heparin     Code Status: Full Code  Family Communication: Discussed with daughter at bedside on 2/11  Patient status: Inpatient  Patient is from : Home  Anticipated discharge to: Home  Estimated DC date: 2-3 days   Consultants: General surgery, cardiology,IR  Procedures: None  Antimicrobials:  Anti-infectives (From admission, onward)    Start     Dose/Rate Route Frequency Ordered Stop   05/27/23 2200  vancomycin (VANCOCIN) IVPB 1000 mg/200 mL premix  Status:  Discontinued        1,000 mg 200 mL/hr over 60 Minutes Intravenous Every 24 hours 05/27/23 0724 05/27/23 1322   05/26/23 2200  vancomycin (VANCOREADY) IVPB 1750 mg/350  mL  Status:  Discontinued        1,750 mg 175 mL/hr over 120 Minutes Intravenous Every 24 hours 05/26/23 0050 05/27/23 0724   05/26/23  1000  metroNIDAZOLE (FLAGYL) IVPB 500 mg        500 mg 100 mL/hr over 60 Minutes Intravenous Every 12 hours 05/26/23 0415 06/02/23 0959   05/26/23 0415  ceFEPIme (MAXIPIME) 2 g in sodium chloride 0.9 % 100 mL IVPB  Status:  Discontinued        2 g 200 mL/hr over 30 Minutes Intravenous  Once 05/26/23 0411 05/26/23 0414   05/26/23 0415  metroNIDAZOLE (FLAGYL) IVPB 500 mg  Status:  Discontinued        500 mg 100 mL/hr over 60 Minutes Intravenous Every 12 hours 05/26/23 0411 05/26/23 0415   05/26/23 0415  vancomycin (VANCOCIN) IVPB 1000 mg/200 mL premix  Status:  Discontinued        1,000 mg 200 mL/hr over 60 Minutes Intravenous  Once 05/26/23 0411 05/26/23 0415   05/26/23 0400  ceFEPIme (MAXIPIME) 2 g in sodium chloride 0.9 % 100 mL IVPB        2 g 200 mL/hr over 30 Minutes Intravenous Every 8 hours 05/26/23 0050     05/25/23 2100  ceFEPIme (MAXIPIME) 2 g in sodium chloride 0.9 % 100 mL IVPB        2 g 200 mL/hr over 30 Minutes Intravenous  Once 05/25/23 2049 05/25/23 2137   05/25/23 2100  metroNIDAZOLE (FLAGYL) IVPB 500 mg        500 mg 100 mL/hr over 60 Minutes Intravenous  Once 05/25/23 2049 05/25/23 2312   05/25/23 2100  vancomycin (VANCOCIN) IVPB 1000 mg/200 mL premix        1,000 mg 200 mL/hr over 60 Minutes Intravenous  Once 05/25/23 2049 05/25/23 2207       Subjective: Patient seen and examined at bedside today.  He looks comfortable this morning.  Heart rate well-controlled, still in A-fib rhythm.  Blood pressure better.  Daughter at bedside.  He is alert and oriented.  Denies any abdominal pain.  Afebrile this morning  Objective: Vitals:   05/28/23 0300 05/28/23 0400 05/28/23 0500 05/28/23 0800  BP: (!) 112/53 (!) 96/47 (!) 113/48   Pulse: 87 93 75   Resp: (!) 25 (!) 27 (!) 21   Temp:  98.2 F (36.8 C)  98.4 F (36.9 C)  TempSrc:  Oral  Oral  SpO2: 98% 98% 99%   Weight:      Height:        Intake/Output Summary (Last 24 hours) at 05/28/2023 1043 Last data filed at  05/28/2023 0600 Gross per 24 hour  Intake 3254.22 ml  Output 800 ml  Net 2454.22 ml   Filed Weights   05/26/23 0000 05/27/23 1156  Weight: 93.2 kg 97.6 kg    Examination:   General exam: Overall comfortable, not in distress HEENT: PERRL Respiratory system:  no wheezes or crackles , diminished air sounds on bilateral bases Cardiovascular system: Irregularly irregular rhythm Gastrointestinal system: Abdomen is nondistended, soft and nontender. Central nervous system: Alert and oriented Extremities: No edema, no clubbing ,no cyanosis Skin: No rashes, no ulcers,no icterus     Data Reviewed: I have personally reviewed following labs and imaging studies  CBC: Recent Labs  Lab 05/25/23 2042 05/26/23 0500 05/27/23 0512 05/28/23 0330  WBC 16.3* 18.0* 12.3* 11.0*  NEUTROABS 14.4*  --   --   --   HGB  12.3* 11.8* 10.6* 11.7*  HCT 37.1* 35.2* 32.9* 35.8*  MCV 90.3 90.5 90.9 92.7  PLT 269 259 176 189   Basic Metabolic Panel: Recent Labs  Lab 05/25/23 2130 05/26/23 0500 05/27/23 0512 05/27/23 1057 05/28/23 0330  NA 126* 127* 128*  --  129*  K 4.5 4.0 4.0  --  4.2  CL 95* 97* 98  --  100  CO2 20* 22 21*  --  19*  GLUCOSE 266* 324* 148*  --  149*  BUN 14 11 12   --  17  CREATININE 0.81 0.74 0.68  --  0.75  CALCIUM 9.0 8.5* 8.2*  --  7.9*  MG  --   --   --  1.3*  --      Recent Results (from the past 240 hours)  Culture, blood (Routine x 2)     Status: None (Preliminary result)   Collection Time: 05/25/23  8:43 PM   Specimen: BLOOD  Result Value Ref Range Status   Specimen Description   Final    BLOOD BLOOD LEFT ARM Performed at [facility], [address], [place], [place] [address]    Special Requests   Final    Blood Culture results may not be optimal due to an inadequate volume of blood received in culture bottles BOTTLES DRAWN AEROBIC AND ANAEROBIC Performed at [facility], [address] [place], [place] [address]     Culture  Setup Time   Final    GRAM POSITIVE COCCI ANAEROBIC BOTTLE ONLY CRITICAL RESULT CALLED TO, READ BACK BY AND VERIFIED WITH: PHARMD JUSTIN LEGGE ON 05/27/23 @ 1842 BY DRT Performed at [facility], [address], [place], [place] [address]    Culture GRAM POSITIVE COCCI  Final   Report Status PENDING  Incomplete  Blood Culture ID Panel (Reflexed)     Status: None   Collection Time: 05/25/23  8:43 PM  Result Value Ref Range Status   Enterococcus faecalis NOT DETECTED NOT DETECTED Final   Enterococcus Faecium NOT DETECTED NOT DETECTED Final   Listeria monocytogenes NOT DETECTED NOT DETECTED Final   Staphylococcus species NOT DETECTED NOT DETECTED Final   Staphylococcus aureus (BCID) NOT DETECTED NOT DETECTED Final   Staphylococcus epidermidis NOT DETECTED NOT DETECTED Final   Staphylococcus lugdunensis NOT DETECTED NOT DETECTED Final   Streptococcus species NOT DETECTED NOT DETECTED Final   Streptococcus agalactiae NOT DETECTED NOT DETECTED Final   Streptococcus pneumoniae NOT DETECTED NOT DETECTED Final   Streptococcus pyogenes NOT DETECTED NOT DETECTED Final   A.calcoaceticus-baumannii NOT DETECTED NOT DETECTED Final   Bacteroides fragilis NOT DETECTED NOT DETECTED Final   Enterobacterales NOT DETECTED NOT DETECTED Final   Enterobacter cloacae complex NOT DETECTED NOT DETECTED Final   Escherichia coli NOT DETECTED NOT DETECTED Final   Klebsiella aerogenes NOT DETECTED NOT DETECTED Final   Klebsiella oxytoca NOT DETECTED NOT DETECTED Final   Klebsiella pneumoniae NOT DETECTED NOT DETECTED Final   Proteus species NOT DETECTED NOT DETECTED Final   Salmonella species NOT DETECTED NOT DETECTED Final   Serratia marcescens NOT DETECTED NOT DETECTED Final   Haemophilus influenzae NOT DETECTED NOT DETECTED Final   Neisseria meningitidis NOT DETECTED NOT DETECTED Final   Pseudomonas aeruginosa NOT DETECTED NOT DETECTED Final   Stenotrophomonas maltophilia NOT DETECTED NOT  DETECTED Final   Candida albicans NOT DETECTED NOT DETECTED Final   Candida auris NOT DETECTED NOT DETECTED Final   Candida glabrata NOT DETECTED NOT DETECTED  Final   Candida krusei NOT DETECTED NOT DETECTED Final   Candida parapsilosis NOT DETECTED NOT DETECTED Final   Candida tropicalis NOT DETECTED NOT DETECTED Final   Cryptococcus neoformans/gattii NOT DETECTED NOT DETECTED Final    Comment: Performed at [facility], [address] [place], [place] [address]  Culture, blood (Routine x 2)     Status: None (Preliminary result)   Collection Time: 05/25/23  8:45 PM   Specimen: BLOOD  Result Value Ref Range Status   Specimen Description   Final    BLOOD BLOOD RIGHT FOREARM Performed at [facility], [address] [place], [place] [address]    Special Requests   Final    Blood Culture results may not be optimal due to an inadequate volume of blood received in culture bottles BOTTLES DRAWN AEROBIC AND ANAEROBIC Performed at [facility], [address], [place], [place] [address]    Culture   Final    NO GROWTH 2 DAYS Performed at [facility], [address] [place], [place] [address]    Report Status PENDING  Incomplete  Resp panel by RT-PCR (RSV, Flu A&B, Covid)     Status: None   Collection Time: 05/25/23  9:05 PM  Result Value Ref Range Status   SARS Coronavirus 2 by RT PCR NEGATIVE NEGATIVE Final    Comment: (NOTE) SARS-CoV-2 target nucleic acids are NOT DETECTED.  The SARS-CoV-2 RNA is generally detectable in upper respiratory specimens during the acute phase of infection. The lowest concentration of SARS-CoV-2 viral copies this assay can detect is 138 copies/mL. A negative result does not preclude SARS-Cov-2 infection and should not be used as the sole basis for treatment or other patient management decisions. A negative result may occur with  improper specimen collection/handling, submission of specimen other than  nasopharyngeal swab, presence of viral mutation(s) within the areas targeted by this assay, and inadequate number of viral copies(<138 copies/mL). A negative result must be combined with clinical observations, patient history, and epidemiological information. The expected result is Negative.  Fact Sheet for Patients:  BloggerCourse.com  Fact Sheet for Healthcare Providers:  SeriousBroker.it  This test is no t yet approved or cleared by the [place] FDA and  has been authorized for detection and/or diagnosis of SARS-CoV-2 by FDA under an Emergency Use Authorization (EUA). This EUA will remain  in effect (meaning this test can be used) for the duration of the COVID-19 declaration under Section 564(b)(1) of the Act, 21 U.S.C.section 360bbb-3(b)(1), unless the authorization is terminated  or revoked sooner.       Influenza A by PCR NEGATIVE NEGATIVE Final   Influenza B by PCR NEGATIVE NEGATIVE Final    Comment: (NOTE) The Xpert Xpress SARS-CoV-2/FLU/RSV plus assay is intended as an aid in the diagnosis of influenza from Nasopharyngeal swab specimens and should not be used as a sole basis for treatment. Nasal washings and aspirates are unacceptable for Xpert Xpress SARS-CoV-2/FLU/RSV testing.  Fact Sheet for Patients: BloggerCourse.com  Fact Sheet for Healthcare Providers: SeriousBroker.it  This test is not yet approved or cleared by the [place] FDA and has been authorized for detection and/or diagnosis of SARS-CoV-2 by FDA under an Emergency Use Authorization (EUA). This EUA will remain in effect (meaning this test can be used) for the duration of the COVID-19 declaration under Section 564(b)(1) of the Act, 21 U.S.C. section 360bbb-3(b)(1), unless the authorization is terminated or revoked.     Resp Syncytial  Virus by PCR NEGATIVE NEGATIVE Final    Comment:  (NOTE) Fact Sheet for Patients: BloggerCourse.com  Fact Sheet for Healthcare Providers: SeriousBroker.it  This test is not yet approved or cleared by the [place] FDA and has been authorized for detection and/or diagnosis of SARS-CoV-2 by FDA under an Emergency Use Authorization (EUA). This EUA will remain in effect (meaning this test can be used) for the duration of the COVID-19 declaration under Section 564(b)(1) of the Act, 21 U.S.C. section 360bbb-3(b)(1), unless the authorization is terminated or revoked.  Performed at [facility], [address], [place], [place] [address]   MRSA Next Gen by PCR, Nasal     Status: None   Collection Time: 05/27/23 12:13 PM   Specimen: Nasal Mucosa; Nasal Swab  Result Value Ref Range Status   MRSA by PCR Next Gen NOT DETECTED NOT DETECTED Final    Comment: (NOTE) The GeneXpert MRSA Assay (FDA approved for NASAL specimens only), is one component of a comprehensive MRSA colonization surveillance program. It is not intended to diagnose MRSA infection nor to guide or monitor treatment for MRSA infections. Test performance is not FDA approved in patients less than 53 years old. Performed at [facility], [address], [place], [place] [address]      Radiology Studies: NM Hepatobiliary Liver Func Result Date: 05/27/2023 CLINICAL DATA:  Sepsis, cholecystitis on prior ultrasound and CT EXAM: NUCLEAR MEDICINE HEPATOBILIARY IMAGING TECHNIQUE: Sequential images of the abdomen were obtained out to 60 minutes following intravenous administration of radiopharmaceutical. RADIOPHARMACEUTICALS:  4.97 mCi Tc-21m Choletec IV, 3 mg morphine IV COMPARISON:  None Available. FINDINGS: Physiologic uptake of radiotracer is seen throughout the hepatic parenchyma. Normal emptying through the common bile duct into the small bowel. The gallbladder is not identified at 60  minutes. Morphine was administered, with no visualization of the gallbladder after 30 additional minutes of imaging. IMPRESSION: 1. Nonvisualization of the gallbladder despite morphine administration, consistent with acute cholecystitis. Electronically Signed   By: Sharlet Salina M.D.   On: 05/27/2023 18:16   CT ABDOMEN PELVIS W CONTRAST Result Date: 05/26/2023 CLINICAL DATA:  Sepsis . Pt was very agitated and fidgety through the entire exam. Best images attainable. EXAM: CT ABDOMEN AND PELVIS WITH CONTRAST TECHNIQUE: Multidetector CT imaging of the abdomen and pelvis was performed using the standard protocol following bolus administration of intravenous contrast. RADIATION DOSE REDUCTION: This exam was performed according to the departmental dose-optimization program which includes automated exposure control, adjustment of the mA and/or kV according to patient size and/or use of iterative reconstruction technique. CONTRAST:  OMNIPAQUE IOHEXOL 300 MG/ML  SOLN COMPARISON:  X-ray lumbar 03/19/2022, CT abdomen pelvis 11/10/2007 FINDINGS: Lower chest: Trace right pleural effusion. Hepatobiliary: Limited evaluation due to motion artifact with gallbladder wall thickening and pericholecystic fat stranding. Similar-appearing hypodensity of the left hepatic lobe likely a simple hepatic cyst (2:22). No CT evidence of gallstones. No pericholecystic fluid. No biliary dilatation. Pancreas: No focal lesion. Normal pancreatic contour. No surrounding inflammatory changes. No main pancreatic ductal dilatation. Spleen: Normal in size without focal abnormality. Adrenals/Urinary Tract: No adrenal nodule bilaterally. Bilateral kidneys enhance symmetrically. Parapelvic simple cysts of the left kidney. Simple renal cysts, in the absence of clinically indicated signs/symptoms, require no independent follow-up. No hydronephrosis. No hydroureter.  No nephroureterolithiasis. The urinary bladder is unremarkable. Minimal excretion of  intravenous contrast of either kidneys on delayed view. Stomach/Bowel: Stomach is within normal limits. No evidence of bowel wall thickening or dilatation.  Third portion of the duodenum diverticula. Appendix appears normal. Vascular/Lymphatic: No abdominal aorta or iliac aneurysm. Severe atherosclerotic plaque of the aorta and its branches. No abdominal, pelvic, or inguinal lymphadenopathy. Reproductive: Prostate is unremarkable. Other: No intraperitoneal free fluid. No intraperitoneal free gas. No organized fluid collection. Musculoskeletal: No abdominal wall hernia or abnormality. No suspicious lytic or blastic osseous lesions. No acute displaced fracture. Chronic severe L1 compression fracture. Multilevel severe degenerative changes of the spine. Partially visualized left femur intramedullary nail fixation. IMPRESSION: 1. Gallbladder wall thickening and pericholecystic fat stranding. Question acute cholecystitis. Finding better evaluated on ultrasound abdomen 05/26/2023. 2. Trace right pleural effusion. 3. Third portion of the duodenum diverticula. Electronically Signed   By: Tish Frederickson M.D.   On: 05/26/2023 22:24   US Abdomen Limited RUQ (LIVER/GB) Result Date: 05/26/2023 CLINICAL DATA:  Fever.  Abnormal gallbladder on chest CT. EXAM: ULTRASOUND ABDOMEN LIMITED RIGHT UPPER QUADRANT COMPARISON:  CT chest from same day. CT abdomen pelvis dated November 10, 2007. FINDINGS: Gallbladder: Distended with sludge. Moderate wall thickening. No gallstones. No sonographic Murphy sign noted by sonographer. Common bile duct: Not visualized, but nondilated on earlier CT. Liver: No focal lesion identified. Within normal limits in parenchymal echogenicity. Portal vein is patent on color Doppler imaging with normal direction of blood flow towards the liver. Other: None. IMPRESSION: 1. Distended gallbladder with sludge and moderate wall thickening. No gallstones. Findings are equivocal for acute cholecystitis. Consider further  evaluation with nuclear medicine hepatobiliary scan. Electronically Signed   By: Obie Dredge M.D.   On: 05/26/2023 15:30   CT CHEST WO CONTRAST Result Date: 05/26/2023 CLINICAL DATA:  Shortness of breath. Cold symptoms for the past few weeks. EXAM: CT CHEST WITHOUT CONTRAST TECHNIQUE: Multidetector CT imaging of the chest was performed following the standard protocol without IV contrast. RADIATION DOSE REDUCTION: This exam was performed according to the departmental dose-optimization program which includes automated exposure control, adjustment of the mA and/or kV according to patient size and/or use of iterative reconstruction technique. COMPARISON:  Chest x-ray from same day. CT chest dated February 04, 2021. FINDINGS: Cardiovascular: No significant vascular findings. Normal heart size. No pericardial effusion. No thoracic aortic aneurysm. Coronary, aortic arch, and branch vessel atherosclerotic vascular disease. Mediastinum/Nodes: No enlarged mediastinal or axillary lymph nodes. Thyroid gland, trachea, and esophagus demonstrate no significant findings. Lungs/Pleura: Bibasilar subsegmental atelectasis. No focal consolidation, pleural effusion, or pneumothorax. Upper Abdomen: Distended gallbladder with mild wall thickening and surrounding inflammatory change. Musculoskeletal: No chest wall mass or suspicious bone lesions identified. IMPRESSION: 1. No acute intrathoracic process. 2. Distended gallbladder with mild wall thickening and surrounding inflammatory change, concerning for acute cholecystitis. Correlate with right upper quadrant pain and consider further evaluation with right upper quadrant ultrasound. 3.  Aortic Atherosclerosis (ICD10-I70.0). Electronically Signed   By: Obie Dredge M.D.   On: 05/26/2023 13:29    Scheduled Meds:  Chlorhexidine Gluconate Cloth  6 each Topical Daily   insulin aspart  0-15 Units Subcutaneous TID WC   insulin aspart  0-5 Units Subcutaneous QHS   melatonin  5 mg  Oral QHS   metoprolol tartrate  25 mg Oral BID   tamsulosin  0.4 mg Oral Daily   Continuous Infusions:  ceFEPime (MAXIPIME) IV Stopped (05/28/23 0437)   diltiazem (CARDIZEM) infusion 10 mg/hr (05/28/23 0600)   heparin 1,550 Units/hr (05/28/23 0600)   metronidazole Stopped (05/27/23 2259)     LOS: 2 days   Burnadette Pop, MD [facility] P2/02/2024, 10:43 AM

## 2023-05-28 NOTE — Plan of Care (Signed)
  Problem: Education: Goal: Ability to describe self-care measures that may prevent or decrease complications (Diabetes Survival Skills Education) will improve Outcome: Progressing   Problem: Coping: Goal: Ability to adjust to condition or change in health will improve Outcome: Progressing   Problem: Fluid Volume: Goal: Ability to maintain a balanced intake and output will improve Outcome: Progressing   Problem: Health Behavior/Discharge Planning: Goal: Ability to identify and utilize available resources and services will improve Outcome: Progressing Goal: Ability to manage health-related needs will improve Outcome: Progressing   Problem: Metabolic: Goal: Ability to maintain appropriate glucose levels will improve Outcome: Progressing   Problem: Nutritional: Goal: Maintenance of adequate nutrition will improve Outcome: Progressing Goal: Progress toward achieving an optimal weight will improve Outcome: Progressing   Problem: Skin Integrity: Goal: Risk for impaired skin integrity will decrease Outcome: Progressing   Problem: Tissue Perfusion: Goal: Adequacy of tissue perfusion will improve Outcome: Progressing   Problem: Fluid Volume: Goal: Hemodynamic stability will improve Outcome: Progressing   Problem: Clinical Measurements: Goal: Diagnostic test results will improve Outcome: Progressing Goal: Signs and symptoms of infection will decrease Outcome: Progressing   Problem: Respiratory: Goal: Ability to maintain adequate ventilation will improve Outcome: Progressing   Problem: Education: Goal: Knowledge of General Education information will improve Description: Including pain rating scale, medication(s)/side effects and non-pharmacologic comfort measures Outcome: Progressing   Problem: Health Behavior/Discharge Planning: Goal: Ability to manage health-related needs will improve Outcome: Progressing   Problem: Clinical Measurements: Goal: Ability to maintain  clinical measurements within normal limits will improve Outcome: Progressing Goal: Will remain free from infection Outcome: Progressing Goal: Diagnostic test results will improve Outcome: Progressing Goal: Respiratory complications will improve Outcome: Progressing Goal: Cardiovascular complication will be avoided Outcome: Progressing   Problem: Activity: Goal: Risk for activity intolerance will decrease Outcome: Progressing   Problem: Nutrition: Goal: Adequate nutrition will be maintained Outcome: Progressing   Problem: Coping: Goal: Level of anxiety will decrease Outcome: Progressing   Problem: Elimination: Goal: Will not experience complications related to bowel motility Outcome: Progressing Goal: Will not experience complications related to urinary retention Outcome: Progressing   Problem: Pain Managment: Goal: General experience of comfort will improve and/or be controlled Outcome: Progressing   Problem: Safety: Goal: Ability to remain free from injury will improve Outcome: Progressing   Problem: Skin Integrity: Goal: Risk for impaired skin integrity will decrease Outcome: Progressing   Cindy S. Clelia Croft BSN, RN, [employer], CCRN 05/28/2023 2:56 AM

## 2023-05-29 ENCOUNTER — Inpatient Hospital Stay (HOSPITAL_COMMUNITY): Payer: Medicare Other

## 2023-05-29 ENCOUNTER — Telehealth (HOSPITAL_COMMUNITY): Payer: Self-pay | Admitting: Pharmacy Technician

## 2023-05-29 ENCOUNTER — Other Ambulatory Visit (HOSPITAL_COMMUNITY): Payer: Self-pay

## 2023-05-29 DIAGNOSIS — A419 Sepsis, unspecified organism: Secondary | ICD-10-CM | POA: Diagnosis not present

## 2023-05-29 DIAGNOSIS — K810 Acute cholecystitis: Secondary | ICD-10-CM

## 2023-05-29 DIAGNOSIS — I4891 Unspecified atrial fibrillation: Secondary | ICD-10-CM | POA: Diagnosis not present

## 2023-05-29 DIAGNOSIS — Z7189 Other specified counseling: Secondary | ICD-10-CM | POA: Diagnosis not present

## 2023-05-29 DIAGNOSIS — Z515 Encounter for palliative care: Secondary | ICD-10-CM

## 2023-05-29 DIAGNOSIS — I480 Paroxysmal atrial fibrillation: Secondary | ICD-10-CM | POA: Diagnosis not present

## 2023-05-29 LAB — GLUCOSE, CAPILLARY
Glucose-Capillary: 166 mg/dL — ABNORMAL HIGH (ref 70–99)
Glucose-Capillary: 182 mg/dL — ABNORMAL HIGH (ref 70–99)
Glucose-Capillary: 183 mg/dL — ABNORMAL HIGH (ref 70–99)
Glucose-Capillary: 223 mg/dL — ABNORMAL HIGH (ref 70–99)

## 2023-05-29 LAB — COMPREHENSIVE METABOLIC PANEL
ALT: 23 U/L (ref 0–44)
AST: 23 U/L (ref 15–41)
Albumin: 2.1 g/dL — ABNORMAL LOW (ref 3.5–5.0)
Alkaline Phosphatase: 53 U/L (ref 38–126)
Anion gap: 10 (ref 5–15)
BUN: 19 mg/dL (ref 8–23)
CO2: 20 mmol/L — ABNORMAL LOW (ref 22–32)
Calcium: 7.4 mg/dL — ABNORMAL LOW (ref 8.9–10.3)
Chloride: 101 mmol/L (ref 98–111)
Creatinine, Ser: 0.78 mg/dL (ref 0.61–1.24)
GFR, Estimated: >60 mL/min (ref >60)
Glucose, Bld: 177 mg/dL — ABNORMAL HIGH (ref 70–99)
Potassium: 4.3 mmol/L (ref 3.5–5.1)
Sodium: 131 mmol/L — ABNORMAL LOW (ref 135–145)
Total Bilirubin: 1.1 mg/dL (ref 0.0–1.2)
Total Protein: 5.1 g/dL — ABNORMAL LOW (ref 6.5–8.1)

## 2023-05-29 LAB — HEPARIN LEVEL (UNFRACTIONATED): Heparin Unfractionated: 0.13 [IU]/mL — ABNORMAL LOW (ref 0.30–0.70)

## 2023-05-29 LAB — CBC
HCT: 35.6 % — ABNORMAL LOW (ref 39.0–52.0)
Hemoglobin: 11 g/dL — ABNORMAL LOW (ref 13.0–17.0)
MCH: 28.9 pg (ref 26.0–34.0)
MCHC: 30.9 g/dL (ref 30.0–36.0)
MCV: 93.7 fL (ref 80.0–100.0)
Platelets: 219 10*3/uL (ref 150–400)
RBC: 3.8 MIL/uL — ABNORMAL LOW (ref 4.22–5.81)
RDW: 14 % (ref 11.5–15.5)
WBC: 10.5 10*3/uL (ref 4.0–10.5)
nRBC: 0 % (ref 0.0–0.2)

## 2023-05-29 MED ORDER — IPRATROPIUM-ALBUTEROL 0.5-2.5 (3) MG/3ML IN SOLN
3.0000 mL | Freq: Four times a day (QID) | RESPIRATORY_TRACT | Status: DC
  Administered 2023-05-29 – 2023-05-30 (×4): 3 mL via RESPIRATORY_TRACT
  Filled 2023-05-29 (×4): qty 3

## 2023-05-29 MED ORDER — APIXABAN 5 MG PO TABS
5.0000 mg | ORAL_TABLET | Freq: Two times a day (BID) | ORAL | Status: DC
  Administered 2023-05-29 – 2023-06-03 (×11): 5 mg via ORAL
  Filled 2023-05-29 (×11): qty 1

## 2023-05-29 MED ORDER — ENSURE ENLIVE PO LIQD
237.0000 mL | Freq: Two times a day (BID) | ORAL | Status: DC
  Administered 2023-05-29 – 2023-05-30 (×3): 237 mL via ORAL

## 2023-05-29 MED ORDER — NITROGLYCERIN 0.4 MG SL SUBL: 0.4000 mg | SUBLINGUAL_TABLET | SUBLINGUAL | Status: DC | PRN

## 2023-05-29 MED ORDER — FUROSEMIDE 10 MG/ML IJ SOLN
20.0000 mg | Freq: Once | INTRAMUSCULAR | Status: AC
  Administered 2023-05-29: 20 mg via INTRAVENOUS
  Filled 2023-05-29: qty 2

## 2023-05-29 NOTE — Telephone Encounter (Signed)
Patient Product/process development scientist completed.    The patient is insured through [facility]. Patient has Medicare and is not eligible for a copay card, but may be able to apply for patient assistance or Medicare RX Payment Plan (Patient Must reach out to their plan, if eligible for payment plan), if available.    Ran test claim for Eliquis 5 mg and the current 30 day co-pay is $302.00 due to a $255.00 deductible.  Will be $47.00 once deductible is met.   This test claim was processed through [facility]- copay amounts may vary at other pharmacies due to pharmacy/plan contracts, or as the patient moves through the different stages of their insurance plan.     Roland Earl, CPHT Pharmacy Technician III Certified Patient Advocate [facility] Patient Advocate Team Direct Number: [phone]  Fax: [phone]

## 2023-05-29 NOTE — Progress Notes (Signed)
Durable healthcare power of attorney paperwork provided by health care agent, Margit Banda. Copies made and placed in chart, originals returned to Ms. Duignan.

## 2023-05-29 NOTE — Progress Notes (Signed)
PROGRESS NOTE  Jonathan Owen  ZOX:[id number] DOB: 23-Jan-1928 DOA: 05/25/2023 PCP: Noni Saupe, MD   Brief Narrative: Patient is a 89 male with history of insulin-dependent diabetes type 2, hypertension, colon cancer, osteoarthritis, prior PE who presented with shortness of breath, fever, chills, weakness from home. He felt like being smothered. On presentation, he was tachycardiac, febrile.  Suspected to have sepsis of unclear etiology.  UA was not suspicoius  UTI.  Chest x-ray did not show any pneumonia.  Also went to A-fib with RVR in the emergency department.  Ultrasound of the abdomen/CT abdomen/pelvis showed acute cholecystitis. HIDA positive.  General surgery, cardiology consulted. S/P IR guided percutaneous cholecystostomy drain on 2/11  Assessment & Plan:  Principal Problem:   Sepsis (HCC) Active Problems:   HTN (hypertension)   Type 2 diabetes mellitus without complication (HCC)   Hyponatremia   Atrial fibrillation with RVR (HCC)   PAF (paroxysmal atrial fibrillation) (HCC)   Medication management   Sepsis: Presented with fever, leukocytosis, tachycardia.  Likely from abdominal cause.  He did not complain of severe abdominal pain on admission, right upper quadrant is mostly nontender.  Continue broad spectrum antibiotics,now on zosyn.  Follow-up cultures, anaerobic culture sets showed gram-positive cocci, most likely contamination.  Aerobic/anaerobic culture showing few gram-negative rods .currently blood pressure stable.  Mildly elevated lactate level on presentation.  Had leukocytosis but now resolved.  Afebrile this morning  Acute cholecystitis: Presented with fever.  Liver enzymes normal.  CT abdomen/pelvis, right upper quadrant ultrasound suggestive of acute cholecystitis.  HIDA came out to be positive.  General surgery consulted.General Surgery recommended IR guided drain placement.  S/P  percutaneous cholecystostomy drain placement on 2/11.  He will continue with drain  on discharge and follow-up with general surgery and IR as an outpatient  New onset A-fib with RVR: Started on Cardizem drip.  This is most likely triggered from sepsis.  Started heparin drip.  Cardiology consulted and following.  Rhythm remains in A-fib this morning, with controlled heart rate.  Insulin-dependent diabetes type 2: Recent A1c of 6.1.  Takes insulin at home.  Continue sliding scale, long-acting insulin.  Monitor blood sugars  Hyponatremia: Improved, on gentle IV fluids  Hypertension: Currently BP stable.  Weakness: Patient lives alone.  Ambulatory at baseline.  PT recommended SNF on discharge  Confusion: This is most likely hospital-acquired delirium.  Minimize sedatives, narcotics continue frequent reorientation, delirium precautions  Goals of care: Goals of care discharge with daughter [name].  She is expecting full recovery.  Patient is usually fully independent and healthy.  Patient remains full code.  Palliative care also consulted    Pressure Injury 05/27/23 Sacrum Medial Stage 1 -  Intact skin with non-blanchable redness of a localized area usually over a bony prominence. (Active)  05/27/23 1200  Location: Sacrum  Location Orientation: Medial  Staging: Stage 1 -  Intact skin with non-blanchable redness of a localized area usually over a bony prominence.  Wound Description (Comments):   Present on Admission: Yes  Dressing Type Foam - Lift dressing to assess site every shift 05/28/23 2000    DVT prophylaxis:iv heparin     Code Status: Full Code  Family Communication: Discussed with daughter at bedside on 2/12  Patient status: Inpatient  Patient is from : Home  Anticipated discharge to: SNF  Estimated DC date: 2-3 days   Consultants: General surgery, cardiology,IR  Procedures: Right upper quadrant drain placement  Antimicrobials:  Anti-infectives (From admission, onward)  Start     Dose/Rate Route Frequency Ordered Stop   05/28/23 1200   piperacillin-tazobactam (ZOSYN) IVPB 3.375 g        3.375 g 12.5 mL/hr over 240 Minutes Intravenous Every 8 hours 05/28/23 1051     05/27/23 2200  vancomycin (VANCOCIN) IVPB 1000 mg/200 mL premix  Status:  Discontinued        1,000 mg 200 mL/hr over 60 Minutes Intravenous Every 24 hours 05/27/23 0724 05/27/23 1322   05/26/23 2200  vancomycin (VANCOREADY) IVPB 1750 mg/350 mL  Status:  Discontinued        1,750 mg 175 mL/hr over 120 Minutes Intravenous Every 24 hours 05/26/23 0050 05/27/23 0724   05/26/23 1000  metroNIDAZOLE (FLAGYL) IVPB 500 mg  Status:  Discontinued        500 mg 100 mL/hr over 60 Minutes Intravenous Every 12 hours 05/26/23 0415 05/28/23 1045   05/26/23 0415  ceFEPIme (MAXIPIME) 2 g in sodium chloride 0.9 % 100 mL IVPB  Status:  Discontinued        2 g 200 mL/hr over 30 Minutes Intravenous  Once 05/26/23 0411 05/26/23 0414   05/26/23 0415  metroNIDAZOLE (FLAGYL) IVPB 500 mg  Status:  Discontinued        500 mg 100 mL/hr over 60 Minutes Intravenous Every 12 hours 05/26/23 0411 05/26/23 0415   05/26/23 0415  vancomycin (VANCOCIN) IVPB 1000 mg/200 mL premix  Status:  Discontinued        1,000 mg 200 mL/hr over 60 Minutes Intravenous  Once 05/26/23 0411 05/26/23 0415   05/26/23 0400  ceFEPIme (MAXIPIME) 2 g in sodium chloride 0.9 % 100 mL IVPB  Status:  Discontinued        2 g 200 mL/hr over 30 Minutes Intravenous Every 8 hours 05/26/23 0050 05/28/23 1045   05/25/23 2100  ceFEPIme (MAXIPIME) 2 g in sodium chloride 0.9 % 100 mL IVPB        2 g 200 mL/hr over 30 Minutes Intravenous  Once 05/25/23 2049 05/25/23 2137   05/25/23 2100  metroNIDAZOLE (FLAGYL) IVPB 500 mg        500 mg 100 mL/hr over 60 Minutes Intravenous  Once 05/25/23 2049 05/25/23 2312   05/25/23 2100  vancomycin (VANCOCIN) IVPB 1000 mg/200 mL premix        1,000 mg 200 mL/hr over 60 Minutes Intravenous  Once 05/25/23 2049 05/25/23 2207       Subjective: Patient seen and examined at bedside today.  He  looks comfortable than yesterday.  He is alert and awake, slightly confused.  Heart rate well-controlled this morning with a stable blood pressure.  Afebrile this morning.  Discussed with daughter at bedside about management plan.  Objective: Vitals:   05/29/23 0515 05/29/23 0530 05/29/23 0545 05/29/23 0800  BP: (!) 121/46 (!) 120/58 (!) 111/45   Pulse: 86 91 (!) 104   Resp: 18 19 (!) 22   Temp:    98 F (36.7 C)  TempSrc:    Oral  SpO2: 98% 97% 99%   Weight:      Height:        Intake/Output Summary (Last 24 hours) at 05/29/2023 1027 Last data filed at 05/29/2023 0500 Gross per 24 hour  Intake 1597.37 ml  Output 645 ml  Net 952.37 ml   Filed Weights   05/26/23 0000 05/27/23 1156  Weight: 93.2 kg 97.6 kg    Examination:    General exam: Overall comfortable, not in distress, deconditioned  and HEENT: PERRL Respiratory system: Diminished air sounds bilaterally Cardiovascular system: Irregularly irregular rhythm Gastrointestinal system: Abdomen is nondistended, soft and nontender.  Right upper drain Central nervous system: Awake, oriented to place and person Extremities: No edema, no clubbing ,no cyanosis Skin: No rashes, no ulcers,no icterus     Data Reviewed: I have personally reviewed following labs and imaging studies  CBC: Recent Labs  Lab 05/25/23 2042 05/26/23 0500 05/27/23 0512 05/28/23 0330 05/29/23 0306  WBC 16.3* 18.0* 12.3* 11.0* 10.5  NEUTROABS 14.4*  --   --   --   --   HGB 12.3* 11.8* 10.6* 11.7* 11.0*  HCT 37.1* 35.2* 32.9* 35.8* 35.6*  MCV 90.3 90.5 90.9 92.7 93.7  PLT 269 259 176 189 219   Basic Metabolic Panel: Recent Labs  Lab 05/25/23 2130 05/26/23 0500 05/27/23 0512 05/27/23 1057 05/28/23 0330 05/29/23 0306  NA 126* 127* 128*  --  129* 131*  K 4.5 4.0 4.0  --  4.2 4.3  CL 95* 97* 98  --  100 101  CO2 20* 22 21*  --  19* 20*  GLUCOSE 266* 324* 148*  --  149* 177*  BUN 14 11 12   --  17 19  CREATININE 0.81 0.74 0.68  --  0.75 0.78   CALCIUM 9.0 8.5* 8.2*  --  7.9* 7.4*  MG  --   --   --  1.3*  --   --      Recent Results (from the past 240 hours)  Culture, blood (Routine x 2)     Status: Abnormal   Collection Time: 05/25/23  8:43 PM   Specimen: BLOOD  Result Value Ref Range Status   Specimen Description   Final    BLOOD BLOOD LEFT ARM Performed at [facility], [address] [place], [place] [address]    Special Requests   Final    Blood Culture results may not be optimal due to an inadequate volume of blood received in culture bottles BOTTLES DRAWN AEROBIC AND ANAEROBIC Performed at [facility], [address] [place], [place] [address]    Culture  Setup Time   Final    GRAM POSITIVE COCCI ANAEROBIC BOTTLE ONLY CRITICAL RESULT CALLED TO, READ BACK BY AND VERIFIED WITH: PHARMD JUSTIN LEGGE ON 05/27/23 @ 1842 BY DRT    Culture (A)  Final    ROTHIA MUCILAGINOSA Standardized susceptibility testing for this organism is not available. Performed at [facility], [address], [place], [place] [address]    Report Status 05/28/2023 FINAL  Final  Blood Culture ID Panel (Reflexed)     Status: None   Collection Time: 05/25/23  8:43 PM  Result Value Ref Range Status   Enterococcus faecalis NOT DETECTED NOT DETECTED Final   Enterococcus Faecium NOT DETECTED NOT DETECTED Final   Listeria monocytogenes NOT DETECTED NOT DETECTED Final   Staphylococcus species NOT DETECTED NOT DETECTED Final   Staphylococcus aureus (BCID) NOT DETECTED NOT DETECTED Final   Staphylococcus epidermidis NOT DETECTED NOT DETECTED Final   Staphylococcus lugdunensis NOT DETECTED NOT DETECTED Final   Streptococcus species NOT DETECTED NOT DETECTED Final   Streptococcus agalactiae NOT DETECTED NOT DETECTED Final   Streptococcus pneumoniae NOT DETECTED NOT DETECTED Final   Streptococcus pyogenes NOT DETECTED NOT DETECTED Final   A.calcoaceticus-baumannii NOT DETECTED NOT DETECTED Final    Bacteroides fragilis NOT DETECTED NOT DETECTED Final   Enterobacterales NOT DETECTED NOT DETECTED Final   Enterobacter  cloacae complex NOT DETECTED NOT DETECTED Final   Escherichia coli NOT DETECTED NOT DETECTED Final   Klebsiella aerogenes NOT DETECTED NOT DETECTED Final   Klebsiella oxytoca NOT DETECTED NOT DETECTED Final   Klebsiella pneumoniae NOT DETECTED NOT DETECTED Final   Proteus species NOT DETECTED NOT DETECTED Final   Salmonella species NOT DETECTED NOT DETECTED Final   Serratia marcescens NOT DETECTED NOT DETECTED Final   Haemophilus influenzae NOT DETECTED NOT DETECTED Final   Neisseria meningitidis NOT DETECTED NOT DETECTED Final   Pseudomonas aeruginosa NOT DETECTED NOT DETECTED Final   Stenotrophomonas maltophilia NOT DETECTED NOT DETECTED Final   Candida albicans NOT DETECTED NOT DETECTED Final   Candida auris NOT DETECTED NOT DETECTED Final   Candida glabrata NOT DETECTED NOT DETECTED Final   Candida krusei NOT DETECTED NOT DETECTED Final   Candida parapsilosis NOT DETECTED NOT DETECTED Final   Candida tropicalis NOT DETECTED NOT DETECTED Final   Cryptococcus neoformans/gattii NOT DETECTED NOT DETECTED Final    Comment: Performed at [facility], [address] [place], [place] [address]  Culture, blood (Routine x 2)     Status: None (Preliminary result)   Collection Time: 05/25/23  8:45 PM   Specimen: BLOOD  Result Value Ref Range Status   Specimen Description   Final    BLOOD BLOOD RIGHT FOREARM Performed at [facility], [address] [place], [place] [address]    Special Requests   Final    Blood Culture results may not be optimal due to an inadequate volume of blood received in culture bottles BOTTLES DRAWN AEROBIC AND ANAEROBIC Performed at [facility], [address], [place], [place] [address]    Culture   Final    NO GROWTH 3 DAYS Performed at [facility], [address], [place], [place]  [address]    Report Status PENDING  Incomplete  Resp panel by RT-PCR (RSV, Flu A&B, Covid)     Status: None   Collection Time: 05/25/23  9:05 PM  Result Value Ref Range Status   SARS Coronavirus 2 by RT PCR NEGATIVE NEGATIVE Final    Comment: (NOTE) SARS-CoV-2 target nucleic acids are NOT DETECTED.  The SARS-CoV-2 RNA is generally detectable in upper respiratory specimens during the acute phase of infection. The lowest concentration of SARS-CoV-2 viral copies this assay can detect is 138 copies/mL. A negative result does not preclude SARS-Cov-2 infection and should not be used as the sole basis for treatment or other patient management decisions. A negative result may occur with  improper specimen collection/handling, submission of specimen other than nasopharyngeal swab, presence of viral mutation(s) within the areas targeted by this assay, and inadequate number of viral copies(<138 copies/mL). A negative result must be combined with clinical observations, patient history, and epidemiological information. The expected result is Negative.  Fact Sheet for Patients:  BloggerCourse.com  Fact Sheet for Healthcare Providers:  SeriousBroker.it  This test is no t yet approved or cleared by the [place] FDA and  has been authorized for detection and/or diagnosis of SARS-CoV-2 by FDA under an Emergency Use Authorization (EUA). This EUA will remain  in effect (meaning this test can be used) for the duration of the COVID-19 declaration under Section 564(b)(1) of the Act, 21 U.S.C.section 360bbb-3(b)(1), unless the authorization is terminated  or revoked sooner.       Influenza A by PCR NEGATIVE NEGATIVE Final   Influenza B by PCR NEGATIVE NEGATIVE Final  Comment: (NOTE) The Xpert Xpress SARS-CoV-2/FLU/RSV plus assay is intended as an aid in the diagnosis of influenza from Nasopharyngeal swab specimens and should not be used as a  sole basis for treatment. Nasal washings and aspirates are unacceptable for Xpert Xpress SARS-CoV-2/FLU/RSV testing.  Fact Sheet for Patients: BloggerCourse.com  Fact Sheet for Healthcare Providers: SeriousBroker.it  This test is not yet approved or cleared by the [place] FDA and has been authorized for detection and/or diagnosis of SARS-CoV-2 by FDA under an Emergency Use Authorization (EUA). This EUA will remain in effect (meaning this test can be used) for the duration of the COVID-19 declaration under Section 564(b)(1) of the Act, 21 U.S.C. section 360bbb-3(b)(1), unless the authorization is terminated or revoked.     Resp Syncytial Virus by PCR NEGATIVE NEGATIVE Final    Comment: (NOTE) Fact Sheet for Patients: BloggerCourse.com  Fact Sheet for Healthcare Providers: SeriousBroker.it  This test is not yet approved or cleared by the [place] FDA and has been authorized for detection and/or diagnosis of SARS-CoV-2 by FDA under an Emergency Use Authorization (EUA). This EUA will remain in effect (meaning this test can be used) for the duration of the COVID-19 declaration under Section 564(b)(1) of the Act, 21 U.S.C. section 360bbb-3(b)(1), unless the authorization is terminated or revoked.  Performed at [facility], [address], [place], [place] [address]   MRSA Next Gen by PCR, Nasal     Status: None   Collection Time: 05/27/23 12:13 PM   Specimen: Nasal Mucosa; Nasal Swab  Result Value Ref Range Status   MRSA by PCR Next Gen NOT DETECTED NOT DETECTED Final    Comment: (NOTE) The GeneXpert MRSA Assay (FDA approved for NASAL specimens only), is one component of a comprehensive MRSA colonization surveillance program. It is not intended to diagnose MRSA infection nor to guide or monitor treatment for MRSA infections. Test performance is not  FDA approved in patients less than 80 years old. Performed at [facility], [address], [place], [place] [address]   Aerobic/Anaerobic Culture w Gram Stain (surgical/deep wound)     Status: None (Preliminary result)   Collection Time: 05/28/23 12:47 PM   Specimen: BILE  Result Value Ref Range Status   Specimen Description   Final    BILE Performed at [facility], [address], [place], [place] [address]    Special Requests   Final    NONE Performed at [facility], [address] [place], [place] [address]    Gram Stain NO WBC SEEN FEW GRAM NEGATIVE RODS   Final   Culture   Final    CULTURE REINCUBATED FOR BETTER GROWTH Performed at [facility], [address], [place], [place] [address]    Report Status PENDING  Incomplete     Radiology Studies: IR Perc Cholecystostomy Result Date: 05/28/2023 INDICATION: [id number] Cholecystitis, acute [id number] Briefly, 89 year old male comorbid with gallbladder distention and POSITIVE HIDA scan. EXAM: Procedures: 1. LIMITED ABDOMINAL ULTRASOUND 2. PERCUTANEOUS CHOLECYSTOSTOMY TUBE PLACEMENT COMPARISON:  CT AP, 05/26/2023.  NM HIDA, 05/27/2023. MEDICATIONS: The patient is currently admitted to the hospital and on intravenous antibiotics. Antibiotics were administered within an appropriate time frame prior to skin puncture. ANESTHESIA/SEDATION: Moderate (conscious) sedation was employed during this procedure. A total of Versed 2 mg and Fentanyl 125 mcg was administered intravenously. Moderate Sedation Time: 22 minutes. The patient's level of consciousness and vital signs were monitored continuously by radiology nursing throughout  the procedure under my direct supervision. CONTRAST:  20mL OMNIPAQUE IOHEXOL 300 MG/ML SOLN - administered into the gallbladder fossa. FLUOROSCOPY TIME:  Fluoroscopic dose; 8 mGy COMPLICATIONS: None immediate. PROCEDURE: Informed written consent was obtained from  the patient after a discussion of the risks, benefits and alternatives to treatment. Questions regarding the procedure were encouraged and answered. A timeout was performed prior to the initiation of the procedure. The RIGHT upper abdominal quadrant was prepped and draped in the usual sterile fashion, and a sterile drape was applied covering the operative field. Maximum barrier sterile technique with sterile gowns and gloves were used for the procedure. A timeout was performed prior to the initiation of the procedure. Local anesthesia was provided with 1% lidocaine with epinephrine. Limited abdominal ultrasound scanning of the RIGHT upper quadrant demonstrates a markedly dilated gallbladder. Of note, the patient reported pain with ultrasound imaging over the gallbladder. Utilizing a transhepatic approach, a 22 gauge needle was advanced into the gallbladder under direct ultrasound guidance. An ultrasound image was saved for documentation purposes. Appropriate intraluminal puncture was confirmed with the efflux of bile and advancement of an 0.018 wire into the gallbladder lumen. The needle was exchanged for an Accustick set. A small amount of contrast was injected to confirm appropriate intraluminal positioning. Over a Benson wire, a 10.2-French Cook cholecystomy tube was advanced into the gallbladder fossa, coiled and locked. Bile was aspirated and a small amount of contrast was injected as several post procedural spot radiographic images were obtained in various obliquities. The catheter was secured to the skin with suture, connected to a drainage bag and a dressing was placed. The patient tolerated the procedure well without immediate post procedural complication. IMPRESSION: 1. Distended gallbladder with gallbladder wall thickening. 2. Successful ultrasound and fluoroscopic guided placement of a 10 Fr percutaneous cholecystostomy tube, via a transhepatic approach. RECOMMENDATIONS: The patient will return to  Vascular Interventional Radiology (VIR) for routine drainage catheter evaluation and exchange in 6 weeks. Roanna Banning, MD [facility] Electronically Signed   By: Roanna Banning M.D.   On: 05/28/2023 16:37   US Abdomen Limited RUQ (LIVER/GB) Result Date: 05/28/2023 INDICATION: [id number] Cholecystitis, acute [id number] Briefly, 89 year old male comorbid with gallbladder distention and POSITIVE HIDA scan. EXAM: Procedures: 1. LIMITED ABDOMINAL ULTRASOUND 2. PERCUTANEOUS CHOLECYSTOSTOMY TUBE PLACEMENT COMPARISON:  CT AP, 05/26/2023.  NM HIDA, 05/27/2023. MEDICATIONS: The patient is currently admitted to the hospital and on intravenous antibiotics. Antibiotics were administered within an appropriate time frame prior to skin puncture. ANESTHESIA/SEDATION: Moderate (conscious) sedation was employed during this procedure. A total of Versed 2 mg and Fentanyl 125 mcg was administered intravenously. Moderate Sedation Time: 22 minutes. The patient's level of consciousness and vital signs were monitored continuously by radiology nursing throughout the procedure under my direct supervision. CONTRAST:  20mL OMNIPAQUE IOHEXOL 300 MG/ML SOLN - administered into the gallbladder fossa. FLUOROSCOPY TIME:  Fluoroscopic dose; 8 mGy COMPLICATIONS: None immediate. PROCEDURE: Informed written consent was obtained from the patient after a discussion of the risks, benefits and alternatives to treatment. Questions regarding the procedure were encouraged and answered. A timeout was performed prior to the initiation of the procedure. The RIGHT upper abdominal quadrant was prepped and draped in the usual sterile fashion, and a sterile drape was applied covering the operative field. Maximum barrier sterile technique with sterile gowns and gloves were used for the procedure. A timeout was performed prior to the initiation of the procedure. Local anesthesia was provided with 1% lidocaine with  epinephrine. Limited abdominal ultrasound scanning of the RIGHT upper quadrant demonstrates a markedly dilated gallbladder. Of note, the patient reported pain with ultrasound imaging over the gallbladder. Utilizing a transhepatic approach, a 22 gauge needle was advanced into the gallbladder under direct ultrasound guidance. An ultrasound image was saved for documentation purposes. Appropriate intraluminal puncture was confirmed with the efflux of bile and advancement of an 0.018 wire into the gallbladder lumen. The needle was exchanged for an Accustick set. A small amount of contrast was injected to confirm appropriate intraluminal positioning. Over a Benson wire, a 10.2-French Cook cholecystomy tube was advanced into the gallbladder fossa, coiled and locked. Bile was aspirated and a small amount of contrast was injected as several post procedural spot radiographic images were obtained in various obliquities. The catheter was secured to the skin with suture, connected to a drainage bag and a dressing was placed. The patient tolerated the procedure well without immediate post procedural complication. IMPRESSION: 1. Distended gallbladder with gallbladder wall thickening. 2. Successful ultrasound and fluoroscopic guided placement of a 10 Fr percutaneous cholecystostomy tube, via a transhepatic approach. RECOMMENDATIONS: The patient will return to Vascular Interventional Radiology (VIR) for routine drainage catheter evaluation and exchange in 6 weeks. Roanna Banning, MD [facility] Electronically Signed   By: Roanna Banning M.D.   On: 05/28/2023 16:37   NM Hepatobiliary Liver Func Result Date: 05/27/2023 CLINICAL DATA:  Sepsis, cholecystitis on prior ultrasound and CT EXAM: NUCLEAR MEDICINE HEPATOBILIARY IMAGING TECHNIQUE: Sequential images of the abdomen were obtained out to 60 minutes following intravenous administration of radiopharmaceutical.  RADIOPHARMACEUTICALS:  4.97 mCi Tc-58m Choletec IV, 3 mg morphine IV COMPARISON:  None Available. FINDINGS: Physiologic uptake of radiotracer is seen throughout the hepatic parenchyma. Normal emptying through the common bile duct into the small bowel. The gallbladder is not identified at 60 minutes. Morphine was administered, with no visualization of the gallbladder after 30 additional minutes of imaging. IMPRESSION: 1. Nonvisualization of the gallbladder despite morphine administration, consistent with acute cholecystitis. Electronically Signed   By: Sharlet Salina M.D.   On: 05/27/2023 18:16    Scheduled Meds:  Chlorhexidine Gluconate Cloth  6 each Topical Daily   feeding supplement  237 mL Oral BID BM   insulin aspart  0-15 Units Subcutaneous TID WC   insulin aspart  0-5 Units Subcutaneous QHS   melatonin  5 mg Oral QHS   sodium chloride flush  5 mL Intracatheter Q8H   tamsulosin  0.4 mg Oral Daily   Continuous Infusions:  sodium chloride 75 mL/hr at 05/29/23 0454   diltiazem (CARDIZEM) infusion 15 mg/hr (05/29/23 0454)   heparin 1,700 Units/hr (05/29/23 0454)   piperacillin-tazobactam (ZOSYN)  IV 12.5 mL/hr at 05/29/23 0454     LOS: 3 days   Burnadette Pop, MD [facility] P2/03/2024, 10:27 AM

## 2023-05-29 NOTE — Consult Note (Signed)
Consultation Note Date: 05/29/2023   Patient Name: Jonathan Owen  DOB: 01/20/1928  MRN: [id number]  Age / Sex: 89 y.o., male  PCP: Noni Saupe, MD Referring Physician: Burnadette Pop, MD  Reason for Consultation:  elderly male with sepsis from cholecystitis, afib with RVR  HPI/Patient Profile: 89 y.o. male  with past medical history of DM2, HTN, remote history of colon cancer s/p L colectomy- no evidence of recurrence, osteoarthritis, hx PE admitted on 05/25/2023 with sepsis d/t acute cholecystitis. Has had drain placed by IR and on IV antibiotics. Having a-fib with RVR- on cardizem and heparin infusions- cards recommend transition to Eliquis.  Palliative medicine consulted for goals of care.    Primary Decision Maker HCPOA - daughter Jonathan Owen- has HCPOA document and Living Will on chart - first HCPOA listed is Jonathan Owen- she is patient's deceased spouse.   Discussion: Chart reviewed including labs, progress notes, imaging from this and previous encounters.  Reviewed patient's HCPOA and Living Will document.  Patient awake, but lethargic. Oriented to person and place. Unable to discuss his health situation.  Son in Social worker at bedside.  Prior to admission patient was in good functional status. Ambulated around his home with a walker. Independent with ADL's.  Met later with patient's daughter at bedside. She notes that patient was living independently prior to admission, but was requiring assistance- they have caregivers that come 7 days a week.  Reviewed patient's current acute issues and chronic medical problems. Advanced Care Planning We discussed goals of care. Primary goal is to stabilize and return home. Patient and daughter are accepting that this illness is going to greatly affect his functional status- she is agreeable to rehab if recommended- patient would refuse to live long term in  a SNF.  Patient has HCPOA and Advance Directives on his chart indicating he would not want extraordinary measures and artificial life support in the face of terminal illness, however, he has indiciated he would want long term artificial feeding and hydration.  We discussed code status. Patient has stated he would want attempts made at resuscitation.  Encouraged patient/family to consider DNR/DNI status understanding evidenced based poor outcomes in similar hospitalized patients, as the cause of the arrest is likely associated with chronic/terminal disease rather than a reversible acute cardio-pulmonary event.  Jonathan Owen understands and would likely agree to DNR, however, would want patient to make that decision. She feels is is cognitively able and request PMT followup with patient tomorrow in hopes that his mental status is improved and he is able to discuss.     SUMMARY OF RECOMMENDATIONS -Continue current plan of care- full code, full scope -PMT will followup tomorrow for further discussion    Code Status/Advance Care Planning:   Code Status: Full Code    Prognosis:   Unable to determine  Discharge Planning: To Be Determined  Primary Diagnoses: Present on Admission:  HTN (hypertension)  Hyponatremia  Sepsis (HCC)  Atrial fibrillation with RVR (HCC)   Review of Systems  Physical Exam  Vital Signs: BP (!) 111/45   Pulse (!) 104   Temp 98 F (36.7 C) (Oral)   Resp (!) 22   Ht 5\' 11"  (1.803 m)   Wt 97.6 kg   SpO2 99%   BMI 30.01 kg/m  Pain Scale: 0-10   Pain Score: 0-No pain   SpO2: SpO2: 99 % O2 Device:SpO2: 99 % O2 Flow Rate: .O2 Flow Rate (L/min): 2 L/min  IO: Intake/output summary:  Intake/Output Summary (Last 24 hours) at 05/29/2023 1027 Last data filed at 05/29/2023 0500 Gross per 24 hour  Intake 1597.37 ml  Output 645 ml  Net 952.37 ml    LBM: Last BM Date :  (pta) Baseline Weight: Weight: 93.2 kg Most recent weight: Weight: 97.6 kg       Thank you for  this consult. Palliative medicine will continue to follow and assist as needed.  Time Total: 85 minutes Signed by: Ocie Bob, AGNP-C Palliative Medicine  Time includes:   Preparing to see the patient (e.g., review of tests) Obtaining and/or reviewing separately obtained history Performing a medically necessary appropriate examination and/or evaluation Counseling and educating the patient/family/caregiver Ordering medications, tests, or procedures Referring and communicating with other health care professionals (when not reported separately) Documenting clinical information in the electronic or other health record Independently interpreting results (not reported separately) and communicating results to the patient/family/caregiver Care coordination (not reported separately) Clinical documentation   Please contact Palliative Medicine Team phone at [phone] for questions and concerns.  For individual provider: See Loretha Stapler

## 2023-05-29 NOTE — Discharge Instructions (Signed)

## 2023-05-29 NOTE — Plan of Care (Signed)

## 2023-05-29 NOTE — TOC Progression Note (Signed)
Transition of Care [facility]) - Progression Note   Patient Details  Name: Jonathan Owen MRN: [id number] Date of Birth: [birth year]  [facility] CM/SW Contact  Ewing Schlein, LCSW Phone Number: 05/29/2023, 8:40 AM  Clinical Narrative: PT evaluation recommended SNF. However, patient is not yet medically ready to be faxed out for SNF at this time per hospitalist. TOC to follow.  Expected Discharge Plan: Skilled Nursing Facility Barriers to Discharge: Continued Medical Work up  Expected Discharge Plan and Services In-house Referral: Clinical Social Work Post Acute Care Choice: Skilled Nursing Facility Living arrangements for the past 2 months: Single Family Home           DME Arranged: N/A DME Agency: NA  Social Determinants of Health (SDOH) Interventions SDOH Screenings   Food Insecurity: No Food Insecurity (05/26/2023)  Housing: Low Risk  (05/26/2023)  Transportation Needs: No Transportation Needs (05/26/2023)  Utilities: Not At Risk (05/26/2023)  Social Connections: Unknown (05/26/2023)  Tobacco Use: Low Risk  (05/25/2023)   Readmission Risk Interventions     No data to display

## 2023-05-29 NOTE — Progress Notes (Signed)
Referring Physician(s): Stechschulte,P  Supervising Physician: Gilmer Mor  Patient Status:  [facility] - In-pt  Chief Complaint: Cholecystitis; s/p perc GB drain 2/11   Subjective: Pt doing ok; daughter in room; eating yogurt; denies worsening abd pain,N/V   Allergies: Tape  Medications: Prior to Admission medications   Medication Sig Start Date End Date Taking? Authorizing Provider  cholecalciferol (VITAMIN D3) 25 MCG (1000 UNIT) tablet Take 3,000 Units by mouth daily.   Yes [provider]  clonazePAM (KLONOPIN) 0.5 MG tablet Take 0.5 mg by mouth 2 (two) times daily as needed.   Yes [provider]  gabapentin (NEURONTIN) 300 MG capsule Take 300 mg by mouth at bedtime.   Yes [provider]  Melatonin 10 MG TABS Take 20 mg by mouth at bedtime as needed (Sleep).   Yes [provider]  tamsulosin (FLOMAX) 0.4 MG CAPS capsule Take 0.4 mg by mouth daily.   Yes [provider]  TOUJEO SOLOSTAR 300 UNIT/ML Solostar Pen Inject 22 Units into the skin in the morning.   Yes [provider]  amoxicillin (AMOXIL) 875 MG tablet Take 875 mg by mouth 2 (two) times daily. Patient not taking: Reported on 05/26/2023 04/22/23   [provider]  aspirin 81 MG tablet Take 81 mg by mouth daily. Patient not taking: Reported on 05/26/2023    [provider]  baclofen (LIORESAL) 10 MG tablet TAKE 1 TABLET BY MOUTH THREE TIMES A DAY Patient not taking: Reported on 05/26/2023 11/05/22   Juanda Chance, NP  Insulin Aspart FlexPen (NOVOLOG) 100 UNIT/ML USE AS DIRECTED. MAX DAILY DOSE IS 50 UNITS. Patient not taking: Reported on 05/25/2023 08/01/20   [provider]  LORazepam (ATIVAN) 1 MG tablet Take 0.5 mg by mouth at bedtime as needed.  Patient not taking: Reported on 05/26/2023    [provider]  traMADol (ULTRAM) 50 MG tablet Take 1 tablet (50 mg total) by mouth every 8 (eight) hours as needed for moderate pain or severe  pain. Patient not taking: Reported on 05/26/2023 09/24/22 09/24/23  Juanda Chance, NP  zolpidem (AMBIEN) 5 MG tablet Take 5 mg by mouth at bedtime as needed. Patient not taking: Reported on 05/26/2023 08/16/22   [provider]     Vital Signs: BP (!) 126/59   Pulse (!) 107   Temp 99 F (37.2 C) (Axillary)   Resp 19   Ht 5\' 11"  (1.803 m)   Wt 215 lb 2.7 oz (97.6 kg)   SpO2 98%   BMI 30.01 kg/m   Physical Exam: awake,answers simple questions ok; GB drain intact, insertion site ok, NT; OP 245 cc green bile; drain flushed without difficulty  Imaging: DG CHEST PORT 1 VIEW Result Date: 05/29/2023 CLINICAL DATA:  Shortness of breath EXAM: PORTABLE CHEST 1 VIEW COMPARISON:  X-ray 05/26/2023. FINDINGS: Film is under penetrated. Underinflated x-ray. Enlarged cardiopericardial silhouette calcified aorta. Bronchovascular crowding. Question tiny effusions with some adjacent lung base opacities. Atelectasis versus infiltrate. Recommend follow-up. Overlapping cardiac leads. IMPRESSION: Underinflation. Postop chest. Enlarged cardiopericardial silhouette. Lung base opacities with tiny effusions.  Recommend follow-up Electronically Signed   By: Karen Kays M.D.   On: 05/29/2023 14:49   IR Perc Cholecystostomy Result Date: 05/28/2023 INDICATION: [id number] Cholecystitis, acute [id number] Briefly, 89 year old male comorbid with gallbladder distention and POSITIVE HIDA scan. EXAM: Procedures: 1. LIMITED ABDOMINAL ULTRASOUND 2. PERCUTANEOUS CHOLECYSTOSTOMY TUBE PLACEMENT COMPARISON:  CT AP, 05/26/2023.  NM HIDA, 05/27/2023. MEDICATIONS: The patient is currently admitted to  the hospital and on intravenous antibiotics. Antibiotics were administered within an appropriate time frame prior to skin puncture. ANESTHESIA/SEDATION: Moderate (conscious) sedation was employed during this procedure. A total of Versed 2 mg and Fentanyl 125 mcg was administered intravenously. Moderate Sedation Time: 22 minutes. The patient's  level of consciousness and vital signs were monitored continuously by radiology nursing throughout the procedure under my direct supervision. CONTRAST:  20mL OMNIPAQUE IOHEXOL 300 MG/ML SOLN - administered into the gallbladder fossa. FLUOROSCOPY TIME:  Fluoroscopic dose; 8 mGy COMPLICATIONS: None immediate. PROCEDURE: Informed written consent was obtained from the patient after a discussion of the risks, benefits and alternatives to treatment. Questions regarding the procedure were encouraged and answered. A timeout was performed prior to the initiation of the procedure. The RIGHT upper abdominal quadrant was prepped and draped in the usual sterile fashion, and a sterile drape was applied covering the operative field. Maximum barrier sterile technique with sterile gowns and gloves were used for the procedure. A timeout was performed prior to the initiation of the procedure. Local anesthesia was provided with 1% lidocaine with epinephrine. Limited abdominal ultrasound scanning of the RIGHT upper quadrant demonstrates a markedly dilated gallbladder. Of note, the patient reported pain with ultrasound imaging over the gallbladder. Utilizing a transhepatic approach, a 22 gauge needle was advanced into the gallbladder under direct ultrasound guidance. An ultrasound image was saved for documentation purposes. Appropriate intraluminal puncture was confirmed with the efflux of bile and advancement of an 0.018 wire into the gallbladder lumen. The needle was exchanged for an Accustick set. A small amount of contrast was injected to confirm appropriate intraluminal positioning. Over a Benson wire, a 10.2-French Cook cholecystomy tube was advanced into the gallbladder fossa, coiled and locked. Bile was aspirated and a small amount of contrast was injected as several post procedural spot radiographic images were obtained in various obliquities. The catheter was secured to the skin with suture, connected to a drainage bag and a  dressing was placed. The patient tolerated the procedure well without immediate post procedural complication. IMPRESSION: 1. Distended gallbladder with gallbladder wall thickening. 2. Successful ultrasound and fluoroscopic guided placement of a 10 Fr percutaneous cholecystostomy tube, via a transhepatic approach. RECOMMENDATIONS: The patient will return to Vascular Interventional Radiology (VIR) for routine drainage catheter evaluation and exchange in 6 weeks. Roanna Banning, MD [facility] Radiology Electronically Signed   By: Roanna Banning M.D.   On: 05/28/2023 16:37   US Abdomen Limited RUQ (LIVER/GB) Result Date: 05/28/2023 INDICATION: [id number] Cholecystitis, acute [id number] Briefly, 89 year old male comorbid with gallbladder distention and POSITIVE HIDA scan. EXAM: Procedures: 1. LIMITED ABDOMINAL ULTRASOUND 2. PERCUTANEOUS CHOLECYSTOSTOMY TUBE PLACEMENT COMPARISON:  CT AP, 05/26/2023.  NM HIDA, 05/27/2023. MEDICATIONS: The patient is currently admitted to the hospital and on intravenous antibiotics. Antibiotics were administered within an appropriate time frame prior to skin puncture. ANESTHESIA/SEDATION: Moderate (conscious) sedation was employed during this procedure. A total of Versed 2 mg and Fentanyl 125 mcg was administered intravenously. Moderate Sedation Time: 22 minutes. The patient's level of consciousness and vital signs were monitored continuously by radiology nursing throughout the procedure under my direct supervision. CONTRAST:  20mL OMNIPAQUE IOHEXOL 300 MG/ML SOLN - administered into the gallbladder fossa. FLUOROSCOPY TIME:  Fluoroscopic dose; 8 mGy COMPLICATIONS: None immediate. PROCEDURE: Informed written consent was obtained from the patient after a discussion of the risks, benefits and alternatives to treatment. Questions regarding the procedure were encouraged and answered. A timeout was performed prior to the initiation  of the procedure. The  RIGHT upper abdominal quadrant was prepped and draped in the usual sterile fashion, and a sterile drape was applied covering the operative field. Maximum barrier sterile technique with sterile gowns and gloves were used for the procedure. A timeout was performed prior to the initiation of the procedure. Local anesthesia was provided with 1% lidocaine with epinephrine. Limited abdominal ultrasound scanning of the RIGHT upper quadrant demonstrates a markedly dilated gallbladder. Of note, the patient reported pain with ultrasound imaging over the gallbladder. Utilizing a transhepatic approach, a 22 gauge needle was advanced into the gallbladder under direct ultrasound guidance. An ultrasound image was saved for documentation purposes. Appropriate intraluminal puncture was confirmed with the efflux of bile and advancement of an 0.018 wire into the gallbladder lumen. The needle was exchanged for an Accustick set. A small amount of contrast was injected to confirm appropriate intraluminal positioning. Over a Benson wire, a 10.2-French Cook cholecystomy tube was advanced into the gallbladder fossa, coiled and locked. Bile was aspirated and a small amount of contrast was injected as several post procedural spot radiographic images were obtained in various obliquities. The catheter was secured to the skin with suture, connected to a drainage bag and a dressing was placed. The patient tolerated the procedure well without immediate post procedural complication. IMPRESSION: 1. Distended gallbladder with gallbladder wall thickening. 2. Successful ultrasound and fluoroscopic guided placement of a 10 Fr percutaneous cholecystostomy tube, via a transhepatic approach. RECOMMENDATIONS: The patient will return to Vascular Interventional Radiology (VIR) for routine drainage catheter evaluation and exchange in 6 weeks. Roanna Banning, MD [facility] Radiology Electronically Signed   By: Roanna Banning M.D.   On: 05/28/2023 16:37   NM Hepatobiliary Liver Func Result Date: 05/27/2023 CLINICAL DATA:  Sepsis, cholecystitis on prior ultrasound and CT EXAM: NUCLEAR MEDICINE HEPATOBILIARY IMAGING TECHNIQUE: Sequential images of the abdomen were obtained out to 60 minutes following intravenous administration of radiopharmaceutical. RADIOPHARMACEUTICALS:  4.97 mCi Tc-53m Choletec IV, 3 mg morphine IV COMPARISON:  None Available. FINDINGS: Physiologic uptake of radiotracer is seen throughout the hepatic parenchyma. Normal emptying through the common bile duct into the small bowel. The gallbladder is not identified at 60 minutes. Morphine was administered, with no visualization of the gallbladder after 30 additional minutes of imaging. IMPRESSION: 1. Nonvisualization of the gallbladder despite morphine administration, consistent with acute cholecystitis. Electronically Signed   By: Sharlet Salina M.D.   On: 05/27/2023 18:16   CT ABDOMEN PELVIS W CONTRAST Result Date: 05/26/2023 CLINICAL DATA:  Sepsis . Pt was very agitated and fidgety through the entire exam. Best images attainable. EXAM: CT ABDOMEN AND PELVIS WITH CONTRAST TECHNIQUE: Multidetector CT imaging of the abdomen and pelvis was performed using the standard protocol following bolus administration of intravenous contrast. RADIATION DOSE REDUCTION: This exam was performed according to the departmental dose-optimization program which includes automated exposure control, adjustment of the mA and/or kV according to patient size and/or use of iterative reconstruction technique. CONTRAST:  OMNIPAQUE IOHEXOL 300 MG/ML  SOLN COMPARISON:  X-ray lumbar 03/19/2022, CT abdomen pelvis 11/10/2007 FINDINGS: Lower chest: Trace right pleural effusion. Hepatobiliary: Limited evaluation due to motion artifact with gallbladder wall thickening and pericholecystic fat stranding. Similar-appearing hypodensity of the left hepatic lobe likely a simple hepatic cyst  (2:22). No CT evidence of gallstones. No pericholecystic fluid. No biliary dilatation. Pancreas: No focal lesion. Normal pancreatic contour. No surrounding inflammatory changes. No main pancreatic ductal dilatation. Spleen: Normal in size without focal  abnormality. Adrenals/Urinary Tract: No adrenal nodule bilaterally. Bilateral kidneys enhance symmetrically. Parapelvic simple cysts of the left kidney. Simple renal cysts, in the absence of clinically indicated signs/symptoms, require no independent follow-up. No hydronephrosis. No hydroureter.  No nephroureterolithiasis. The urinary bladder is unremarkable. Minimal excretion of intravenous contrast of either kidneys on delayed view. Stomach/Bowel: Stomach is within normal limits. No evidence of bowel wall thickening or dilatation. Third portion of the duodenum diverticula. Appendix appears normal. Vascular/Lymphatic: No abdominal aorta or iliac aneurysm. Severe atherosclerotic plaque of the aorta and its branches. No abdominal, pelvic, or inguinal lymphadenopathy. Reproductive: Prostate is unremarkable. Other: No intraperitoneal free fluid. No intraperitoneal free gas. No organized fluid collection. Musculoskeletal: No abdominal wall hernia or abnormality. No suspicious lytic or blastic osseous lesions. No acute displaced fracture. Chronic severe L1 compression fracture. Multilevel severe degenerative changes of the spine. Partially visualized left femur intramedullary nail fixation. IMPRESSION: 1. Gallbladder wall thickening and pericholecystic fat stranding. Question acute cholecystitis. Finding better evaluated on ultrasound abdomen 05/26/2023. 2. Trace right pleural effusion. 3. Third portion of the duodenum diverticula. Electronically Signed   By: Tish Frederickson M.D.   On: 05/26/2023 22:24   US Abdomen Limited RUQ (LIVER/GB) Result Date: 05/26/2023 CLINICAL DATA:  Fever.  Abnormal gallbladder on chest CT. EXAM: ULTRASOUND ABDOMEN LIMITED RIGHT UPPER QUADRANT  COMPARISON:  CT chest from same day. CT abdomen pelvis dated November 10, 2007. FINDINGS: Gallbladder: Distended with sludge. Moderate wall thickening. No gallstones. No sonographic Murphy sign noted by sonographer. Common bile duct: Not visualized, but nondilated on earlier CT. Liver: No focal lesion identified. Within normal limits in parenchymal echogenicity. Portal vein is patent on color Doppler imaging with normal direction of blood flow towards the liver. Other: None. IMPRESSION: 1. Distended gallbladder with sludge and moderate wall thickening. No gallstones. Findings are equivocal for acute cholecystitis. Consider further evaluation with nuclear medicine hepatobiliary scan. Electronically Signed   By: Obie Dredge M.D.   On: 05/26/2023 15:30   CT CHEST WO CONTRAST Result Date: 05/26/2023 CLINICAL DATA:  Shortness of breath. Cold symptoms for the past few weeks. EXAM: CT CHEST WITHOUT CONTRAST TECHNIQUE: Multidetector CT imaging of the chest was performed following the standard protocol without IV contrast. RADIATION DOSE REDUCTION: This exam was performed according to the departmental dose-optimization program which includes automated exposure control, adjustment of the mA and/or kV according to patient size and/or use of iterative reconstruction technique. COMPARISON:  Chest x-ray from same day. CT chest dated February 04, 2021. FINDINGS: Cardiovascular: No significant vascular findings. Normal heart size. No pericardial effusion. No thoracic aortic aneurysm. Coronary, aortic arch, and branch vessel atherosclerotic vascular disease. Mediastinum/Nodes: No enlarged mediastinal or axillary lymph nodes. Thyroid gland, trachea, and esophagus demonstrate no significant findings. Lungs/Pleura: Bibasilar subsegmental atelectasis. No focal consolidation, pleural effusion, or pneumothorax. Upper Abdomen: Distended gallbladder with mild wall thickening and surrounding inflammatory change. Musculoskeletal: No chest  wall mass or suspicious bone lesions identified. IMPRESSION: 1. No acute intrathoracic process. 2. Distended gallbladder with mild wall thickening and surrounding inflammatory change, concerning for acute cholecystitis. Correlate with right upper quadrant pain and consider further evaluation with right upper quadrant ultrasound. 3.  Aortic Atherosclerosis (ICD10-I70.0). Electronically Signed   By: Obie Dredge M.D.   On: 05/26/2023 13:29   ECHOCARDIOGRAM COMPLETE Result Date: 05/26/2023    ECHOCARDIOGRAM REPORT   Patient Name:   ZYDEN SUMAN Rathbone Date of Exam: 05/26/2023 Medical Rec #:  [id number]         Height:  66.5 in Accession #:    [id number]        Weight:       205.5 lb Date of Birth:  1927-10-24          BSA:          2.034 m Patient Age:    89 years          BP:           136/87 mmHg Patient Gender: M                 HR:           100 bpm. Exam Location:  Inpatient Procedure: 2D Echo, Cardiac Doppler and Color Doppler Indications:    Arrhythmia  History:        Patient has no prior history of Echocardiogram examinations.                 Risk Factors:Hypertension and Diabetes.  Sonographer:    Darlys Gales Referring Phys: [id number] MOHAMMAD L GARBA IMPRESSIONS  1. Left ventricular ejection fraction, by estimation, is 60 to 65%. The left ventricle has normal function. Left ventricular endocardial border not optimally defined to evaluate regional wall motion. Left ventricular diastolic parameters are consistent with Grade I diastolic dysfunction (impaired relaxation).  2. Right ventricular systolic function was not well visualized. The right ventricular size is normal.  3. Left atrial size was moderately dilated.  4. The mitral valve is grossly normal. No evidence of mitral valve regurgitation. No evidence of mitral stenosis.  5. The aortic valve was not well visualized. There is moderate calcification of the aortic valve. Aortic valve regurgitation is not visualized. Aortic valve sclerosis/calcification is  present, without any evidence of aortic stenosis. Aortic valve area, by VTI measures 2.09 cm. Aortic valve mean gradient measures 11.0 mmHg. Aortic valve Vmax measures 1.92 m/s.  6. Aortic dilatation noted. There is mild dilatation of the aortic root, measuring 42 mm. Normal size of ascending aorta. Comparison(s): No prior Echocardiogram. FINDINGS  Left Ventricle: Left ventricular ejection fraction, by estimation, is 60 to 65%. The left ventricle has normal function. Left ventricular endocardial border not optimally defined to evaluate regional wall motion. The left ventricular internal cavity size was normal in size. There is no left ventricular hypertrophy. Left ventricular diastolic parameters are consistent with Grade I diastolic dysfunction (impaired relaxation). Normal left ventricular filling pressure. Right Ventricle: The right ventricular size is normal. No increase in right ventricular wall thickness. Right ventricular systolic function was not well visualized. Left Atrium: Left atrial size was moderately dilated. Right Atrium: Right atrial size was normal in size. Pericardium: There is no evidence of pericardial effusion. Mitral Valve: The mitral valve is grossly normal. No evidence of mitral valve regurgitation. No evidence of mitral valve stenosis. Tricuspid Valve: The tricuspid valve is not well visualized. Tricuspid valve regurgitation is trivial. No evidence of tricuspid stenosis. Aortic Valve: The aortic valve was not well visualized. There is moderate calcification of the aortic valve. Aortic valve regurgitation is not visualized. Aortic valve sclerosis/calcification is present, without any evidence of aortic stenosis. Aortic valve mean gradient measures 11.0 mmHg. Aortic valve peak gradient measures 14.7 mmHg. Aortic valve area, by VTI measures 2.09 cm. Pulmonic Valve: The pulmonic valve was not well visualized. Pulmonic valve regurgitation is trivial. No evidence of pulmonic stenosis. Aorta:  Aortic dilatation noted. There is mild dilatation of the aortic root, measuring 42 mm. Venous: The inferior vena cava was not well visualized. IAS/Shunts:  The interatrial septum was not well visualized.  LEFT VENTRICLE PLAX 2D LVIDd:         4.90 cm   Diastology LVIDs:         3.60 cm   LV e' medial:    8.49 cm/s LV PW:         1.00 cm   LV E/e' medial:  9.8 LV IVS:        1.00 cm   LV e' lateral:   10.10 cm/s LVOT diam:     2.00 cm   LV E/e' lateral: 8.3 LV SV:         74 LV SV Index:   37 LVOT Area:     3.14 cm  RIGHT VENTRICLE RV S prime:     23.00 cm/s TAPSE (M-mode): 2.8 cm LEFT ATRIUM             Index LA Vol (A2C):   45.8 ml 22.51 ml/m LA Vol (A4C):   88.1 ml 43.31 ml/m LA Biplane Vol: 64.3 ml 31.61 ml/m  AORTIC VALVE AV Area (Vmax):    1.85 cm AV Area (Vmean):   1.73 cm AV Area (VTI):     2.09 cm AV Vmax:           192.00 cm/s AV Vmean:          164.000 cm/s AV VTI:            0.356 m AV Peak Grad:      14.7 mmHg AV Mean Grad:      11.0 mmHg LVOT Vmax:         113.00 cm/s LVOT Vmean:        90.400 cm/s LVOT VTI:          0.237 m LVOT/AV VTI ratio: 0.67  AORTA Ao Root diam: 4.20 cm MITRAL VALVE               TRICUSPID VALVE MV Area (PHT): 3.27 cm    TR Peak grad:   35.0 mmHg MV Decel Time: 232 msec    TR Vmax:        296.00 cm/s MV E velocity: 83.60 cm/s MV A velocity: 84.00 cm/s  SHUNTS MV E/A ratio:  1.00        Systemic VTI:  0.24 m                            Systemic Diam: 2.00 cm Vishnu Priya Mallipeddi Electronically signed by Winfield Rast Mallipeddi Signature Date/Time: 05/26/2023/11:18:32 AM    Final    DG Chest Portable 1 View Result Date: 05/26/2023 CLINICAL DATA:  Increased heart rate and shortness of breath. EXAM: PORTABLE CHEST 1 VIEW COMPARISON:  May 25, 2023 FINDINGS: Multiple sternal wires are noted. Cardiac silhouette is mildly enlarged and unchanged in size. Low lung volumes are seen. Mild atelectasis is present within the bilateral lung bases. No pleural effusion or pneumothorax  is identified. Multilevel degenerative changes are seen throughout the thoracic spine. IMPRESSION: 1. Evidence of prior median sternotomy. 2. Low lung volumes with mild bibasilar atelectasis. Electronically Signed   By: Aram Candela M.D.   On: 05/26/2023 01:14   DG Chest Portable 1 View Result Date: 05/25/2023 CLINICAL DATA:  Cough, sepsis EXAM: PORTABLE CHEST 1 VIEW COMPARISON:  07/18/2021 FINDINGS: Single frontal view of the chest demonstrates stable postsurgical changes from median sternotomy. The cardiac silhouette is enlarged. Lung volumes are diminished, with crowding  of the central vasculature. No airspace disease, effusion, or pneumothorax. IMPRESSION: 1. Low lung volumes.  No acute airspace disease. Electronically Signed   By: Sharlet Salina M.D.   On: 05/25/2023 21:52    Labs:  CBC: Recent Labs    05/26/23 0500 05/27/23 0512 05/28/23 0330 05/29/23 0306  WBC 18.0* 12.3* 11.0* 10.5  HGB 11.8* 10.6* 11.7* 11.0*  HCT 35.2* 32.9* 35.8* 35.6*  PLT 259 176 189 219    COAGS: Recent Labs    05/25/23 2055 05/26/23 0500  INR 1.2 1.2  APTT 25  --     BMP: Recent Labs    05/26/23 0500 05/27/23 0512 05/28/23 0330 05/29/23 0306  NA 127* 128* 129* 131*  K 4.0 4.0 4.2 4.3  CL 97* 98 100 101  CO2 22 21* 19* 20*  GLUCOSE 324* 148* 149* 177*  BUN 11 12 17 19   CALCIUM 8.5* 8.2* 7.9* 7.4*  CREATININE 0.74 0.68 0.75 0.78  GFRNONAA >60 >60 >60 >60    LIVER FUNCTION TESTS: Recent Labs    05/26/23 0500 05/27/23 0512 05/28/23 0330 05/29/23 0306  BILITOT 1.8* 1.1 0.9 1.1  AST 17 50* 35 23  ALT 12 25 27 23   ALKPHOS 54 55 59 53  PROT 5.8* 5.5* 5.3* 5.1*  ALBUMIN 2.6* 2.4* 2.2* 2.1*    Assessment and Plan: 89 y.o. male with past medical history significant for arthritis, colon cancer with prior partial colectomy 2006, diabetes, prior PE, hypertension who was admitted to [facility] on 06/22/2023 with acute cholecystitis; poor surgical candidate; s/p perc GB drain  2/11; afebrile,  WBC nl, hgb 11, creat nl, t bili nl, bile cx pend (gm neg rods); cont tid drain flushes, close OP monitoring; GB drain will need to remain in place at least 4 to 6 weeks to allow for tract maturation before considering removal unless cholecystectomy done in interim; pt tent scheduled for f/u cholangiogram/GB drain exchange on 07/09/23  Electronically Signed: D. Jeananne Rama, PA-C 05/29/2023, 3:58 PM   I spent a total of 15 Minutes at the the patient's bedside AND on the patient's hospital floor or unit, greater than 50% of which was counseling/coordinating care for gallbladder drain    Patient ID: Jonathan Owen, male   DOB: [birth year], 89 y.o.   MRN: [id number]

## 2023-05-29 NOTE — Progress Notes (Signed)
PHARMACY - ANTICOAGULATION CONSULT NOTE  Pharmacy Consult for heparin  Indication: new onset  atrial fibrillation  Allergies  Allergen Reactions   Tape Other (See Comments)    Unknown    Patient Measurements: Height: 5\' 11"  (180.3 cm) Weight: 97.6 kg (215 lb 2.7 oz) IBW/kg (Calculated) : 75.3 Heparin Dosing Weight: 95 kg  Vital Signs: Temp: 98.8 F (37.1 C) (02/12 0045) Temp Source: Axillary (02/12 0045) BP: 115/46 (02/12 0315) Pulse Rate: 98 (02/12 0315)  Labs: Recent Labs    05/26/23 0500 05/26/23 1038 05/27/23 0512 05/27/23 1818 05/28/23 0330 05/29/23 0306  HGB 11.8*  --  10.6*  --  11.7* 11.0*  HCT 35.2*  --  32.9*  --  35.8* 35.6*  PLT 259  --  176  --  189 219  LABPROT 15.7*  --   --   --   --   --   INR 1.2  --   --   --   --   --   HEPARINUNFRC  --    < >  --  <0.10* 0.30 0.13*  CREATININE 0.74  --  0.68  --  0.75 0.78   < > = values in this interval not displayed.    Estimated Creatinine Clearance: 68.7 mL/min (by C-G formula based on SCr of 0.78 mg/dL).   Medical History: Past Medical History:  Diagnosis Date   Arthritis    Cancer (HCC)    hx colon cancer   Diabetes mellitus without complication (HCC)    no meds-watches diet   Hypertension    no meds now-   Wears dentures    upper-partial bottom   Wears glasses     Assessment: Patient is a 89 y.o M who presented to the ED on 05/25/23 with c/o generalized weakness and SOB. He was subsequently found to have acute cholecystitis and afib with RVR.  Heparin drip started on 05/28/23 for afib. He underwent perc chole tube placement on 05/28/23 with heparin drip d/ced at 9:22a for procedure. Per Dr. Lytle Butte via [facility] msg, ok to resume heparin drip back 6 hrs post procedure.  - patient had procedure at ~1p  on 2/11  05/29/2023: Heparin level 0.13 is sub-therapeutic after resuming IV heparin 1550 units/hr.  He was previously therapeutic on this rate.  CBC: Hg 11-low/stable, pltc WNL No bleeding or infusion  related concerns reported by RN  Goal of Therapy:  Heparin level 0.3-0.7 units/ml Monitor platelets by anticoagulation protocol: Yes   Plan:  - No bolus for now since <24h post-procedure - Increase heparin infusion to 1700 units/hr (conservative increase since previously at goal on 1500 units/hr) - check heparin level 8 hr after rate increase - monitor for s/sx bleeding   Junita Push PharmD 05/29/2023,3:35 AM

## 2023-05-29 NOTE — Progress Notes (Addendum)
Patient Name: Jonathan Owen Date of Encounter: 05/29/2023 [facility] Cardiologist: None   Interval Summary  .    Patient resting comfortably.  Son-in-law's in the room.  Reporting improvement in overall clinical status.  Had his drain placed yesterday  Vital Signs .    Vitals:   05/29/23 0515 05/29/23 0530 05/29/23 0545 05/29/23 0800  BP: (!) 121/46 (!) 120/58 (!) 111/45   Pulse: 86 91 (!) 104   Resp: 18 19 (!) 22   Temp:    98 F (36.7 C)  TempSrc:    Oral  SpO2: 98% 97% 99%   Weight:      Height:        Intake/Output Summary (Last 24 hours) at 05/29/2023 1026 Last data filed at 05/29/2023 0500 Gross per 24 hour  Intake 1597.37 ml  Output 645 ml  Net 952.37 ml      05/27/2023   11:56 AM 05/26/2023   12:00 AM 11/25/2012   12:19 PM  Last 3 Weights  Weight (lbs) 215 lb 2.7 oz 205 lb 7.5 oz 206 lb 9.6 oz  Weight (kg) 97.6 kg 93.2 kg 93.713 kg      Telemetry/ECG    Atrial fibrillation heart rates generally in the 90s- Personally Reviewed  CV Studies    Echocardiogram 05/26/2023 1. Left ventricular ejection fraction, by estimation, is 60 to 65%. The  left ventricle has normal function. Left ventricular endocardial border  not optimally defined to evaluate regional wall motion. Left ventricular  diastolic parameters are consistent  with Grade I diastolic dysfunction (impaired relaxation).   2. Right ventricular systolic function was not well visualized. The right  ventricular size is normal.   3. Left atrial size was moderately dilated.   4. The mitral valve is grossly normal. No evidence of mitral valve  regurgitation. No evidence of mitral stenosis.   5. The aortic valve was not well visualized. There is moderate  calcification of the aortic valve. Aortic valve regurgitation is not  visualized. Aortic valve sclerosis/calcification is present, without any  evidence of aortic stenosis. Aortic valve area,  by VTI measures 2.09 cm. Aortic valve mean  gradient measures 11.0 mmHg.  Aortic valve Vmax measures 1.92 m/s.   6. Aortic dilatation noted. There is mild dilatation of the aortic root,  measuring 42 mm. Normal size of ascending aorta.   Comparison(s): No prior Echocardiogram.     Physical Exam .   GEN: No acute distress.   Neck: No JVD Cardiac: IRRR Respiratory: Clear to auscultation bilaterally. GI: Soft, nontender, non-distended  MS: No edema  Patient Profile    Jonathan Owen is a 89 y.o. male has hx of diabetes, hypertension, colon cancer, OA.  Admitted for cholecystitis found to be in new onset atrial fibrillation.  Assessment & Plan .     New onset atrial fibrillation  Likely secondary to acute cholecystitis.  Controlled ventricular rates on diltiazem drip 15.  Echo shows preserved EF 60-65% with not well-visualized RV function.  Moderately dilated LA.  Transition from IV heparin to Eliquis 5 mg twice daily. Need to speak with daughter about plans for long term anticoagulation.  Reports no falls and lives at home alone so likely a reasonable candidate, but daughter previously reported hesitancy. Would need to be on anticoagulation nonetheless in the short term for plans for eventual DCCV, after atleast 3 weeks of DOAC.  Continue IV diltiazem for now, once he improves more from a infection standpoint we will  transition this to p.o. and start titrating down.   Normal TSH.  Sepsis secondary to acute cholecystitis Per primary team.  Status post drainage tubes.  For questions or updates, please contact [facility] Please consult [email or web address] for contact info under    Signed, Abagail Kitchens, PA-C    Patient seen and examined, note reviewed with the signed Advanced Practice Provider. I personally reviewed laboratory data, imaging studies and relevant notes. I independently examined the patient and formulated the important aspects of the plan. I have personally discussed the plan with the patient and/or  family. Comments or changes to the note/plan are indicated below.  Atrial fibrillation with rapid ventricular rate-rate has been controlled he is on Cardizem drip as well as heparin drip.  CHADS2 Vasc score  is 5 which will indicate anticoagulation for stroke prevention-will convert the patient from heparin to Eliquis dose is 5 mg twice daily.   Hesitant to purse rhythm control in the inpatient because daughter and patient still not sure of anticoagulation use.   I expect his rhythm to continue to maintain control once his infection continue to resolve.   Diabetes mellitus per primary team.   Blood pressure stable.  Will need zio monitor at the time of discharge    Thomasene Ripple DO, MS [facility] Attending Cardiologist [facility]  [address] [place], [place] [address] [phone] Website: [email or web address]

## 2023-05-30 DIAGNOSIS — I10 Essential (primary) hypertension: Secondary | ICD-10-CM | POA: Diagnosis not present

## 2023-05-30 DIAGNOSIS — Z7189 Other specified counseling: Secondary | ICD-10-CM | POA: Diagnosis not present

## 2023-05-30 DIAGNOSIS — I4891 Unspecified atrial fibrillation: Secondary | ICD-10-CM | POA: Diagnosis not present

## 2023-05-30 DIAGNOSIS — A419 Sepsis, unspecified organism: Secondary | ICD-10-CM | POA: Diagnosis not present

## 2023-05-30 LAB — CBC
HCT: 34.6 % — ABNORMAL LOW (ref 39.0–52.0)
Hemoglobin: 11.1 g/dL — ABNORMAL LOW (ref 13.0–17.0)
MCH: 29.1 pg (ref 26.0–34.0)
MCHC: 32.1 g/dL (ref 30.0–36.0)
MCV: 90.6 fL (ref 80.0–100.0)
Platelets: 273 10*3/uL (ref 150–400)
RBC: 3.82 MIL/uL — ABNORMAL LOW (ref 4.22–5.81)
RDW: 14.2 % (ref 11.5–15.5)
WBC: 10.5 10*3/uL (ref 4.0–10.5)
nRBC: 0 % (ref 0.0–0.2)

## 2023-05-30 LAB — BASIC METABOLIC PANEL
Anion gap: 8 (ref 5–15)
BUN: 17 mg/dL (ref 8–23)
CO2: 22 mmol/L (ref 22–32)
Calcium: 7.6 mg/dL — ABNORMAL LOW (ref 8.9–10.3)
Chloride: 103 mmol/L (ref 98–111)
Creatinine, Ser: 0.62 mg/dL (ref 0.61–1.24)
GFR, Estimated: >60 mL/min (ref >60)
Glucose, Bld: 207 mg/dL — ABNORMAL HIGH (ref 70–99)
Potassium: 3.9 mmol/L (ref 3.5–5.1)
Sodium: 133 mmol/L — ABNORMAL LOW (ref 135–145)

## 2023-05-30 LAB — GLUCOSE, CAPILLARY
Glucose-Capillary: 216 mg/dL — ABNORMAL HIGH (ref 70–99)
Glucose-Capillary: 269 mg/dL — ABNORMAL HIGH (ref 70–99)
Glucose-Capillary: 185 mg/dL — ABNORMAL HIGH (ref 70–99)
Glucose-Capillary: 180 mg/dL — ABNORMAL HIGH (ref 70–99)

## 2023-05-30 MED ORDER — INSULIN GLARGINE-YFGN 100 UNIT/ML ~~LOC~~ SOLN
10.0000 [IU] | Freq: Every day | SUBCUTANEOUS | Status: DC
  Administered 2023-05-30 – 2023-06-03 (×5): 10 [IU] via SUBCUTANEOUS
  Filled 2023-05-30 (×5): qty 0.1

## 2023-05-30 MED ORDER — IPRATROPIUM-ALBUTEROL 0.5-2.5 (3) MG/3ML IN SOLN
3.0000 mL | Freq: Two times a day (BID) | RESPIRATORY_TRACT | Status: DC
  Administered 2023-05-30 – 2023-05-31 (×3): 3 mL via RESPIRATORY_TRACT
  Filled 2023-05-30 (×3): qty 3

## 2023-05-30 MED ORDER — DILTIAZEM HCL 60 MG PO TABS
60.0000 mg | ORAL_TABLET | Freq: Three times a day (TID) | ORAL | Status: DC
  Administered 2023-05-30 – 2023-05-31 (×3): 60 mg via ORAL
  Filled 2023-05-30 (×3): qty 1

## 2023-05-30 MED ORDER — CIPROFLOXACIN HCL 500 MG PO TABS
500.0000 mg | ORAL_TABLET | Freq: Two times a day (BID) | ORAL | Status: DC
Start: 2023-05-30 — End: 2023-06-10
  Administered 2023-05-30 – 2023-06-03 (×9): 500 mg via ORAL
  Filled 2023-05-30 (×9): qty 1

## 2023-05-30 MED ORDER — METRONIDAZOLE 500 MG PO TABS
500.0000 mg | ORAL_TABLET | Freq: Two times a day (BID) | ORAL | Status: DC
  Administered 2023-05-30 – 2023-06-03 (×9): 500 mg via ORAL
  Filled 2023-05-30 (×9): qty 1

## 2023-05-30 MED ORDER — GLUCERNA SHAKE PO LIQD
237.0000 mL | Freq: Three times a day (TID) | ORAL | Status: DC
  Administered 2023-05-30 – 2023-06-03 (×4): 237 mL via ORAL
  Filled 2023-05-30 (×14): qty 237

## 2023-05-30 MED ORDER — SALINE SPRAY 0.65 % NA SOLN
1.0000 | NASAL | Status: DC | PRN
  Filled 2023-05-30: qty 44

## 2023-05-30 NOTE — Progress Notes (Signed)
Physical Therapy Treatment Patient Details Name: Jonathan Owen MRN: [id number] DOB: [birth year] Today's Date: 05/30/2023   History of Present Illness 89 yo  male  who presented with SOB, fever, chills, & weakness. pt was tachycardiac, febrile. Suspected  sepsis of unclear etiology. CT chest negative. pt  went into A-fib with RVR in the ED. US of the abdomen/CT abdomen/pelvis showed acute cholecystitis. HIDA positive. General surgery, cardiology consulted. IR planning for percutaneous cholecystostomy drain 05/28/23.   PMH: insulin-dependent diabetes type 2, hypertension, colon cancer, osteoarthritis, prior PE    PT Comments  The patient reporting that he feels weak. Patient tolerated  mobility to sitting  on bed edge then stood at RW x 2. BP 140/72  sitting, then 138/48  after standing x 2. Max HR 112.  Patient assisted back to supine. Noted arms weeping.  Continue PT .    If plan is discharge home, recommend the following: A lot of help with walking and/or transfers;A lot of help with bathing/dressing/bathroom;Assistance with cooking/housework;Assist for transportation;Help with stairs or ramp for entrance   Can travel by private vehicle     Yes  Equipment Recommendations  None recommended by PT    Recommendations for Other Services       Precautions / Restrictions Precautions Precautions: Fall Precaution/Restrictions Comments: monitor sats and HR and BP Restrictions Weight Bearing Restrictions Per Provider Order: No     Mobility  Bed Mobility Overal bed mobility: Needs Assistance Bed Mobility: Supine to Sit, Sit to Supine     Supine to sit: +2 for physical assistance, +2 for safety/equipment, Max assist Sit to supine: Max assist, +2 for physical assistance, +2 for safety/equipment   General bed mobility comments: assist to progress LEs on/off bed and elevate/control descent of  trunk; good pt effort to self assist    Transfers Overall transfer level: Needs  assistance Equipment used: Rolling walker (2 wheels) Transfers: Sit to/from Stand Sit to Stand: Mod assist, +2 safety/equipment, +2 physical assistance, Max assist           General transfer comment: max  +2 to rise first trial,  stood x ~ 2  min.,mod to rise 2nd trail. Stood x < 1 "    Ambulation/Gait                   Comptroller Bed    Modified Rankin (Stroke Patients Only)       Balance Overall balance assessment: Needs assistance Sitting-balance support: Feet supported, Bilateral upper extremity supported Sitting balance-Leahy Scale: Fair     Standing balance support: During functional activity, Reliant on assistive device for balance, Bilateral upper extremity supported Standing balance-Leahy Scale: Poor Standing balance comment: reliant on  device and external assist                            Communication Communication Communication: No apparent difficulties Factors Affecting Communication: Hearing impaired  Cognition Arousal: Alert Behavior During Therapy: Flat affect   PT - Cognitive impairments: Awareness                       PT - Cognition Comments: patient forgets he is in hospital but states he is at [facility] Following commands: Intact      Cueing Cueing Techniques: Verbal cues  Exercises      General  Comments        Pertinent Vitals/Pain Pain Assessment Pain Assessment: No/denies pain    Home Living                          Prior Function            PT Goals (current goals can now be found in the care plan section) Progress towards PT goals: Progressing toward goals    Frequency    Min 1X/week      PT Plan      Co-evaluation              AM-PAC PT "6 Clicks" Mobility   Outcome Measure  Help needed turning from your back to your side while in a flat bed without using bedrails?: A Lot Help needed moving from lying on your back to  sitting on the side of a flat bed without using bedrails?: Total Help needed moving to and from a bed to a chair (including a wheelchair)?: Total Help needed standing up from a chair using your arms (e.g., wheelchair or bedside chair)?: Total Help needed to walk in hospital room?: Total Help needed climbing 3-5 steps with a railing? : Total 6 Click Score: 7    End of Session Equipment Utilized During Treatment: Gait belt Activity Tolerance: Patient limited by fatigue;Patient tolerated treatment well Patient left: in bed;with call bell/phone within reach;with family/visitor present;with bed alarm set Nurse Communication: Mobility status PT Visit Diagnosis: Other abnormalities of gait and mobility (R26.89);Unsteadiness on feet (R26.81);Muscle weakness (generalized) (M62.81)     Time: 3875-6433 PT Time Calculation (min) (ACUTE ONLY): 25 min  Charges:    $Therapeutic Activity: 23-37 mins PT General Charges $$ ACUTE PT VISIT: 1 Visit                     Blanchard Kelch PT Acute Rehabilitation Services Office [phone] Weekend pager-[phone]    Rada Hay 05/30/2023, 3:41 PM

## 2023-05-30 NOTE — Progress Notes (Signed)
 PROGRESS NOTE  Jonathan Owen  ZOX:[id number] DOB: 25-Apr-1927 DOA: 05/25/2023 PCP: Noni Saupe, MD   Brief Narrative: Patient is a 89 male with history of insulin-dependent diabetes type 2, hypertension, colon cancer, osteoarthritis, prior PE who presented with shortness of breath, fever, chills, weakness from home. He felt like being smothered. On presentation, he was tachycardiac, febrile.  Suspected to have sepsis of unclear etiology.  UA was not suspicoius  UTI.  Chest x-ray did not show any pneumonia.  Also went to A-fib with RVR in the emergency department.  Ultrasound of the abdomen/CT abdomen/pelvis showed acute cholecystitis. HIDA positive.  General surgery, cardiology consulted. S/P IR guided percutaneous cholecystostomy drain on 2/11.  Clinically improving  Assessment & Plan:  Principal Problem:   Sepsis (HCC) Active Problems:   HTN (hypertension)   Type 2 diabetes mellitus without complication (HCC)   Hyponatremia   Atrial fibrillation with RVR (HCC)   PAF (paroxysmal atrial fibrillation) (HCC)   Medication management   Sepsis: Presented with fever, leukocytosis, tachycardia.  Likely from abdominal cause.  He did not complain of severe abdominal pain on admission, right upper quadrant is mostly nontender. Blood  anaerobic bottle culture sets showed gram-positive cocci, most likely contamination.  Aerobic/anaerobic culture showed ESBL E. coli.  Antibiotics changed to  ciprofloxacin and flagyl  as per ID pharmacy.currently blood pressure stable.  Mildly elevated lactate level on presentation.  Had leukocytosis but now resolved.  Afebrile this morning  Acute cholecystitis: Presented with fever.  Liver enzymes normal.  CT abdomen/pelvis, right upper quadrant ultrasound suggestive of acute cholecystitis.  HIDA came out to be positive.  General surgery consulted.General Surgery recommended IR guided drain placement.  S/P  percutaneous cholecystostomy drain placement on 2/11.  He  will continue with drain on discharge and follow-up with general surgery and IR as an outpatient  New onset A-fib with RVR: Started on Cardizem drip.  This is most likely triggered from sepsis.    Cardiology consulted and following.  Rhythm remains in A-fib this morning, with improved heart rate.  Anticoagulation changed to Eliquis  Insulin-dependent diabetes type 2: Recent A1c of 6.1.  Takes insulin at home.  Continue sliding scale, long-acting insulin.  Monitor blood sugars  Hyponatremia: Improved  Hypertension: Currently BP stable.  Weakness: Patient lives alone.  Ambulatory at baseline.  PT recommended SNF on discharge  Confusion: Alert and oriented at baseline.  This is most likely hospital-acquired delirium.  Minimize sedatives, narcotics continue frequent reorientation, delirium precautions  Goals of care: Goals of care discharge with daughter [name].  She is expecting full recovery.  Patient is usually fully independent and healthy.  Patient remains full code.  Palliative care also consulted and following    Pressure Injury 05/27/23 Sacrum Medial Stage 1 -  Intact skin with non-blanchable redness of a localized area usually over a bony prominence. (Active)  05/27/23 1200  Location: Sacrum  Location Orientation: Medial  Staging: Stage 1 -  Intact skin with non-blanchable redness of a localized area usually over a bony prominence.  Wound Description (Comments):   Present on Admission: Yes  Dressing Type Foam - Lift dressing to assess site every shift 05/30/23 0931    DVT prophylaxis:iv heparin apixaban (ELIQUIS) tablet 5 mg     Code Status: Full Code  Family Communication: Discussed with daughter at bedside on 2/13  Patient status: Inpatient  Patient is from : Home  Anticipated discharge to: SNF  Estimated DC date: 2-3 days   Consultants:  General surgery, cardiology,IR  Procedures: Right upper quadrant drain placement  Antimicrobials:  Anti-infectives (From  admission, onward)    Start     Dose/Rate Route Frequency Ordered Stop   05/30/23 1045  ciprofloxacin (CIPRO) tablet 500 mg        500 mg Oral 2 times daily 05/30/23 0957     05/30/23 1045  metroNIDAZOLE (FLAGYL) tablet 500 mg        500 mg Oral Every 12 hours 05/30/23 0957     05/28/23 1200  piperacillin-tazobactam (ZOSYN) IVPB 3.375 g  Status:  Discontinued        3.375 g 12.5 mL/hr over 240 Minutes Intravenous Every 8 hours 05/28/23 1051 05/30/23 0957   05/27/23 2200  vancomycin (VANCOCIN) IVPB 1000 mg/200 mL premix  Status:  Discontinued        1,000 mg 200 mL/hr over 60 Minutes Intravenous Every 24 hours 05/27/23 0724 05/27/23 1322   05/26/23 2200  vancomycin (VANCOREADY) IVPB 1750 mg/350 mL  Status:  Discontinued        1,750 mg 175 mL/hr over 120 Minutes Intravenous Every 24 hours 05/26/23 0050 05/27/23 0724   05/26/23 1000  metroNIDAZOLE (FLAGYL) IVPB 500 mg  Status:  Discontinued        500 mg 100 mL/hr over 60 Minutes Intravenous Every 12 hours 05/26/23 0415 05/28/23 1045   05/26/23 0415  ceFEPIme (MAXIPIME) 2 g in sodium chloride 0.9 % 100 mL IVPB  Status:  Discontinued        2 g 200 mL/hr over 30 Minutes Intravenous  Once 05/26/23 0411 05/26/23 0414   05/26/23 0415  metroNIDAZOLE (FLAGYL) IVPB 500 mg  Status:  Discontinued        500 mg 100 mL/hr over 60 Minutes Intravenous Every 12 hours 05/26/23 0411 05/26/23 0415   05/26/23 0415  vancomycin (VANCOCIN) IVPB 1000 mg/200 mL premix  Status:  Discontinued        1,000 mg 200 mL/hr over 60 Minutes Intravenous  Once 05/26/23 0411 05/26/23 0415   05/26/23 0400  ceFEPIme (MAXIPIME) 2 g in sodium chloride 0.9 % 100 mL IVPB  Status:  Discontinued        2 g 200 mL/hr over 30 Minutes Intravenous Every 8 hours 05/26/23 0050 05/28/23 1045   05/25/23 2100  ceFEPIme (MAXIPIME) 2 g in sodium chloride 0.9 % 100 mL IVPB        2 g 200 mL/hr over 30 Minutes Intravenous  Once 05/25/23 2049 05/25/23 2137   05/25/23 2100  metroNIDAZOLE  (FLAGYL) IVPB 500 mg        500 mg 100 mL/hr over 60 Minutes Intravenous  Once 05/25/23 2049 05/25/23 2312   05/25/23 2100  vancomycin (VANCOCIN) IVPB 1000 mg/200 mL premix        1,000 mg 200 mL/hr over 60 Minutes Intravenous  Once 05/25/23 2049 05/25/23 2207       Subjective: Patient seen and examined at bedside today.  He looks better today.  Afebrile.  Blood pressure stable.  Heart rate in the range of 90-1 20, still in A-fib.  Off oxygen today.  Eating.  Denies abdomen pain or chest pain.  Slightly confused this morning.  Daughter at bedside  Objective: Vitals:   05/30/23 0900 05/30/23 0930 05/30/23 1000 05/30/23 1030  BP: (!) 124/52  (!) 109/46 (!) 119/45  Pulse: 100 100 (!) 101 96  Resp: (!) 30 (!) 27 (!) 28 (!) 26  Temp:      TempSrc:  SpO2: 97% 95% 98% 97%  Weight:      Height:        Intake/Output Summary (Last 24 hours) at 05/30/2023 1049 Last data filed at 05/30/2023 1610 Gross per 24 hour  Intake 1128.47 ml  Output 1270 ml  Net -141.53 ml   Filed Weights   05/26/23 0000 05/27/23 1156  Weight: 93.2 kg 97.6 kg    Examination:  General exam: Overall comfortable, not in distress, weak, deconditioned HEENT: PERRL Respiratory system: Diminished air sounds on bases, no wheezes or crackles  Cardiovascular system: Irregular irregular rhythm Gastrointestinal system: Abdomen is nondistended, soft and nontender.  Right upper quadrant drain Central nervous system: Alert and awake, oriented to place Extremities: No edema, no clubbing ,no cyanosis Skin: No rashes, no ulcers,no icterus     Data Reviewed: I have personally reviewed following labs and imaging studies  CBC: Recent Labs  Lab 05/25/23 2042 05/26/23 0500 05/27/23 0512 05/28/23 0330 05/29/23 0306 05/30/23 0733  WBC 16.3* 18.0* 12.3* 11.0* 10.5 10.5  NEUTROABS 14.4*  --   --   --   --   --   HGB 12.3* 11.8* 10.6* 11.7* 11.0* 11.1*  HCT 37.1* 35.2* 32.9* 35.8* 35.6* 34.6*  MCV 90.3 90.5 90.9 92.7  93.7 90.6  PLT 269 259 176 189 219 273   Basic Metabolic Panel: Recent Labs  Lab 05/26/23 0500 05/27/23 0512 05/27/23 1057 05/28/23 0330 05/29/23 0306 05/30/23 0733  NA 127* 128*  --  129* 131* 133*  K 4.0 4.0  --  4.2 4.3 3.9  CL 97* 98  --  100 101 103  CO2 22 21*  --  19* 20* 22  GLUCOSE 324* 148*  --  149* 177* 207*  BUN 11 12  --  17 19 17   CREATININE 0.74 0.68  --  0.75 0.78 0.62  CALCIUM 8.5* 8.2*  --  7.9* 7.4* 7.6*  MG  --   --  1.3*  --   --   --      Recent Results (from the past 240 hours)  Culture, blood (Routine x 2)     Status: Abnormal   Collection Time: 05/25/23  8:43 PM   Specimen: BLOOD  Result Value Ref Range Status   Specimen Description   Final    BLOOD BLOOD LEFT ARM Performed at [facility], [address] [place], [place] [address]    Special Requests   Final    Blood Culture results may not be optimal due to an inadequate volume of blood received in culture bottles BOTTLES DRAWN AEROBIC AND ANAEROBIC Performed at [facility], [address], [place], [place] [address]    Culture  Setup Time   Final    GRAM POSITIVE COCCI ANAEROBIC BOTTLE ONLY CRITICAL RESULT CALLED TO, READ BACK BY AND VERIFIED WITH: PHARMD JUSTIN LEGGE ON 05/27/23 @ 1842 BY DRT    Culture (A)  Final    ROTHIA MUCILAGINOSA Standardized susceptibility testing for this organism is not available. Performed at [facility], [address], [place], [place] [address]    Report Status 05/28/2023 FINAL  Final  Blood Culture ID Panel (Reflexed)     Status: None   Collection Time: 05/25/23  8:43 PM  Result Value Ref Range Status   Enterococcus faecalis NOT DETECTED NOT DETECTED Final   Enterococcus Faecium NOT DETECTED NOT DETECTED Final   Listeria monocytogenes NOT DETECTED NOT DETECTED Final   Staphylococcus species NOT DETECTED  NOT DETECTED Final   Staphylococcus aureus (BCID) NOT DETECTED NOT DETECTED Final   Staphylococcus  epidermidis NOT DETECTED NOT DETECTED Final   Staphylococcus lugdunensis NOT DETECTED NOT DETECTED Final   Streptococcus species NOT DETECTED NOT DETECTED Final   Streptococcus agalactiae NOT DETECTED NOT DETECTED Final   Streptococcus pneumoniae NOT DETECTED NOT DETECTED Final   Streptococcus pyogenes NOT DETECTED NOT DETECTED Final   A.calcoaceticus-baumannii NOT DETECTED NOT DETECTED Final   Bacteroides fragilis NOT DETECTED NOT DETECTED Final   Enterobacterales NOT DETECTED NOT DETECTED Final   Enterobacter cloacae complex NOT DETECTED NOT DETECTED Final   Escherichia coli NOT DETECTED NOT DETECTED Final   Klebsiella aerogenes NOT DETECTED NOT DETECTED Final   Klebsiella oxytoca NOT DETECTED NOT DETECTED Final   Klebsiella pneumoniae NOT DETECTED NOT DETECTED Final   Proteus species NOT DETECTED NOT DETECTED Final   Salmonella species NOT DETECTED NOT DETECTED Final   Serratia marcescens NOT DETECTED NOT DETECTED Final   Haemophilus influenzae NOT DETECTED NOT DETECTED Final   Neisseria meningitidis NOT DETECTED NOT DETECTED Final   Pseudomonas aeruginosa NOT DETECTED NOT DETECTED Final   Stenotrophomonas maltophilia NOT DETECTED NOT DETECTED Final   Candida albicans NOT DETECTED NOT DETECTED Final   Candida auris NOT DETECTED NOT DETECTED Final   Candida glabrata NOT DETECTED NOT DETECTED Final   Candida krusei NOT DETECTED NOT DETECTED Final   Candida parapsilosis NOT DETECTED NOT DETECTED Final   Candida tropicalis NOT DETECTED NOT DETECTED Final   Cryptococcus neoformans/gattii NOT DETECTED NOT DETECTED Final    Comment: Performed at [facility], [address] [place], [place] [address]  Culture, blood (Routine x 2)     Status: None (Preliminary result)   Collection Time: 05/25/23  8:45 PM   Specimen: BLOOD  Result Value Ref Range Status   Specimen Description   Final    BLOOD BLOOD RIGHT FOREARM Performed at [facility], [address]  [place], [place] [address]    Special Requests   Final    Blood Culture results may not be optimal due to an inadequate volume of blood received in culture bottles BOTTLES DRAWN AEROBIC AND ANAEROBIC Performed at [facility], [address], [place], [place] [address]    Culture   Final    NO GROWTH 4 DAYS Performed at [facility], [address], [place], [place] [address]    Report Status PENDING  Incomplete  Resp panel by RT-PCR (RSV, Flu A&B, Covid)     Status: None   Collection Time: 05/25/23  9:05 PM  Result Value Ref Range Status   SARS Coronavirus 2 by RT PCR NEGATIVE NEGATIVE Final    Comment: (NOTE) SARS-CoV-2 target nucleic acids are NOT DETECTED.  The SARS-CoV-2 RNA is generally detectable in upper respiratory specimens during the acute phase of infection. The lowest concentration of SARS-CoV-2 viral copies this assay can detect is 138 copies/mL. A negative result does not preclude SARS-Cov-2 infection and should not be used as the sole basis for treatment or other patient management decisions. A negative result may occur with  improper specimen collection/handling, submission of specimen other than nasopharyngeal swab, presence of viral mutation(s) within the areas targeted by this assay, and inadequate number of viral copies(<138 copies/mL). A negative result must be combined with clinical observations, patient history, and epidemiological information. The expected result is Negative.  Fact Sheet for Patients:  BloggerCourse.com  Fact Sheet for Healthcare Providers:  SeriousBroker.it  This test is no t yet approved or cleared by the [place] and  has been authorized for detection and/or diagnosis of SARS-CoV-2 by FDA under an Emergency Use Authorization (EUA). This EUA will remain  in effect (meaning this test can be used) for the duration of the COVID-19 declaration under Section  564(b)(1) of the Act, 21 U.S.C.section 360bbb-3(b)(1), unless the authorization is terminated  or revoked sooner.       Influenza A by PCR NEGATIVE NEGATIVE Final   Influenza B by PCR NEGATIVE NEGATIVE Final    Comment: (NOTE) The Xpert Xpress SARS-CoV-2/FLU/RSV plus assay is intended as an aid in the diagnosis of influenza from Nasopharyngeal swab specimens and should not be used as a sole basis for treatment. Nasal washings and aspirates are unacceptable for Xpert Xpress SARS-CoV-2/FLU/RSV testing.  Fact Sheet for Patients: BloggerCourse.com  Fact Sheet for Healthcare Providers: SeriousBroker.it  This test is not yet approved or cleared by the [place] FDA and has been authorized for detection and/or diagnosis of SARS-CoV-2 by FDA under an Emergency Use Authorization (EUA). This EUA will remain in effect (meaning this test can be used) for the duration of the COVID-19 declaration under Section 564(b)(1) of the Act, 21 U.S.C. section 360bbb-3(b)(1), unless the authorization is terminated or revoked.     Resp Syncytial Virus by PCR NEGATIVE NEGATIVE Final    Comment: (NOTE) Fact Sheet for Patients: BloggerCourse.com  Fact Sheet for Healthcare Providers: SeriousBroker.it  This test is not yet approved or cleared by the [place] FDA and has been authorized for detection and/or diagnosis of SARS-CoV-2 by FDA under an Emergency Use Authorization (EUA). This EUA will remain in effect (meaning this test can be used) for the duration of the COVID-19 declaration under Section 564(b)(1) of the Act, 21 U.S.C. section 360bbb-3(b)(1), unless the authorization is terminated or revoked.  Performed at [facility], [address], [place], [place] [address]   MRSA Next Gen by PCR, Nasal     Status: None   Collection Time: 05/27/23 12:13 PM   Specimen:  Nasal Mucosa; Nasal Swab  Result Value Ref Range Status   MRSA by PCR Next Gen NOT DETECTED NOT DETECTED Final    Comment: (NOTE) The GeneXpert MRSA Assay (FDA approved for NASAL specimens only), is one component of a comprehensive MRSA colonization surveillance program. It is not intended to diagnose MRSA infection nor to guide or monitor treatment for MRSA infections. Test performance is not FDA approved in patients less than 88 years old. Performed at [facility], [address], [place], [place] [address]   Aerobic/Anaerobic Culture w Gram Stain (surgical/deep wound)     Status: None (Preliminary result)   Collection Time: 05/28/23 12:47 PM   Specimen: BILE  Result Value Ref Range Status   Specimen Description   Final    BILE Performed at [facility], [address] [place], [place] [address]    Special Requests   Final    NONE Performed at [facility], [address], [place], [place] [address]    Gram Stain   Final    NO WBC SEEN FEW GRAM NEGATIVE RODS Performed at [facility], [address], [place], [place] [address]    Culture   Final    MODERATE ESCHERICHIA COLI Confirmed Extended Spectrum Beta-Lactamase Producer (ESBL).  In bloodstream infections from ESBL organisms, carbapenems are preferred over piperacillin/tazobactam. They are shown to have a lower  risk of mortality.    Report Status PENDING  Incomplete   Organism ID, Bacteria ESCHERICHIA COLI  Final      Susceptibility   Escherichia coli - MIC*    AMPICILLIN >=32 RESISTANT Resistant     CEFEPIME <=0.12 SENSITIVE Sensitive     CEFTAZIDIME RESISTANT Resistant     CEFTRIAXONE 1 SENSITIVE Sensitive     CIPROFLOXACIN <=0.25 SENSITIVE Sensitive     GENTAMICIN <=1 SENSITIVE Sensitive     IMIPENEM <=0.25 SENSITIVE Sensitive     TRIMETH/SULFA <=20 SENSITIVE Sensitive     AMPICILLIN/SULBACTAM >=32 RESISTANT Resistant     PIP/TAZO 64 INTERMEDIATE  Intermediate ug/mL    * MODERATE ESCHERICHIA COLI     Radiology Studies: DG CHEST PORT 1 VIEW Result Date: 05/29/2023 CLINICAL DATA:  Shortness of breath EXAM: PORTABLE CHEST 1 VIEW COMPARISON:  X-ray 05/26/2023. FINDINGS: Film is under penetrated. Underinflated x-ray. Enlarged cardiopericardial silhouette calcified aorta. Bronchovascular crowding. Question tiny effusions with some adjacent lung base opacities. Atelectasis versus infiltrate. Recommend follow-up. Overlapping cardiac leads. IMPRESSION: Underinflation. Postop chest. Enlarged cardiopericardial silhouette. Lung base opacities with tiny effusions.  Recommend follow-up Electronically Signed   By: Karen Kays M.D.   On: 05/29/2023 14:49   IR Perc Cholecystostomy Result Date: 05/28/2023 INDICATION: [id number] Cholecystitis, acute [id number] Briefly, 89 year old male comorbid with gallbladder distention and POSITIVE HIDA scan. EXAM: Procedures: 1. LIMITED ABDOMINAL ULTRASOUND 2. PERCUTANEOUS CHOLECYSTOSTOMY TUBE PLACEMENT COMPARISON:  CT AP, 05/26/2023.  NM HIDA, 05/27/2023. MEDICATIONS: The patient is currently admitted to the hospital and on intravenous antibiotics. Antibiotics were administered within an appropriate time frame prior to skin puncture. ANESTHESIA/SEDATION: Moderate (conscious) sedation was employed during this procedure. A total of Versed 2 mg and Fentanyl 125 mcg was administered intravenously. Moderate Sedation Time: 22 minutes. The patient's level of consciousness and vital signs were monitored continuously by radiology nursing throughout the procedure under my direct supervision. CONTRAST:  20mL OMNIPAQUE IOHEXOL 300 MG/ML SOLN - administered into the gallbladder fossa. FLUOROSCOPY TIME:  Fluoroscopic dose; 8 mGy COMPLICATIONS: None immediate. PROCEDURE: Informed written consent was obtained from the patient after a discussion of the risks, benefits and alternatives to treatment. Questions regarding the procedure were encouraged and  answered. A timeout was performed prior to the initiation of the procedure. The RIGHT upper abdominal quadrant was prepped and draped in the usual sterile fashion, and a sterile drape was applied covering the operative field. Maximum barrier sterile technique with sterile gowns and gloves were used for the procedure. A timeout was performed prior to the initiation of the procedure. Local anesthesia was provided with 1% lidocaine with epinephrine. Limited abdominal ultrasound scanning of the RIGHT upper quadrant demonstrates a markedly dilated gallbladder. Of note, the patient reported pain with ultrasound imaging over the gallbladder. Utilizing a transhepatic approach, a 22 gauge needle was advanced into the gallbladder under direct ultrasound guidance. An ultrasound image was saved for documentation purposes. Appropriate intraluminal puncture was confirmed with the efflux of bile and advancement of an 0.018 wire into the gallbladder lumen. The needle was exchanged for an Accustick set. A small amount of contrast was injected to confirm appropriate intraluminal positioning. Over a Benson wire, a 10.2-French Cook cholecystomy tube was advanced into the gallbladder fossa, coiled and locked. Bile was aspirated and a small amount of contrast was injected as several post procedural spot radiographic images were obtained in various obliquities. The catheter was secured to the skin with suture, connected to a drainage bag and a dressing was placed. The  patient tolerated the procedure well without immediate post procedural complication. IMPRESSION: 1. Distended gallbladder with gallbladder wall thickening. 2. Successful ultrasound and fluoroscopic guided placement of a 10 Fr percutaneous cholecystostomy tube, via a transhepatic approach. RECOMMENDATIONS: The patient will return to Vascular Interventional Radiology (VIR) for routine drainage catheter evaluation and exchange in 6 weeks. Roanna Banning, MD [facility] Electronically Signed   By: Roanna Banning M.D.   On: 05/28/2023 16:37   US Abdomen Limited RUQ (LIVER/GB) Result Date: 05/28/2023 INDICATION: [id number] Cholecystitis, acute [id number] Briefly, 89 year old male comorbid with gallbladder distention and POSITIVE HIDA scan. EXAM: Procedures: 1. LIMITED ABDOMINAL ULTRASOUND 2. PERCUTANEOUS CHOLECYSTOSTOMY TUBE PLACEMENT COMPARISON:  CT AP, 05/26/2023.  NM HIDA, 05/27/2023. MEDICATIONS: The patient is currently admitted to the hospital and on intravenous antibiotics. Antibiotics were administered within an appropriate time frame prior to skin puncture. ANESTHESIA/SEDATION: Moderate (conscious) sedation was employed during this procedure. A total of Versed 2 mg and Fentanyl 125 mcg was administered intravenously. Moderate Sedation Time: 22 minutes. The patient's level of consciousness and vital signs were monitored continuously by radiology nursing throughout the procedure under my direct supervision. CONTRAST:  20mL OMNIPAQUE IOHEXOL 300 MG/ML SOLN - administered into the gallbladder fossa. FLUOROSCOPY TIME:  Fluoroscopic dose; 8 mGy COMPLICATIONS: None immediate. PROCEDURE: Informed written consent was obtained from the patient after a discussion of the risks, benefits and alternatives to treatment. Questions regarding the procedure were encouraged and answered. A timeout was performed prior to the initiation of the procedure. The RIGHT upper abdominal quadrant was prepped and draped in the usual sterile fashion, and a sterile drape was applied covering the operative field. Maximum barrier sterile technique with sterile gowns and gloves were used for the procedure. A timeout was performed prior to the initiation of the procedure. Local anesthesia was provided with 1% lidocaine with epinephrine. Limited abdominal ultrasound scanning of the RIGHT upper quadrant demonstrates a markedly dilated gallbladder. Of note, the  patient reported pain with ultrasound imaging over the gallbladder. Utilizing a transhepatic approach, a 22 gauge needle was advanced into the gallbladder under direct ultrasound guidance. An ultrasound image was saved for documentation purposes. Appropriate intraluminal puncture was confirmed with the efflux of bile and advancement of an 0.018 wire into the gallbladder lumen. The needle was exchanged for an Accustick set. A small amount of contrast was injected to confirm appropriate intraluminal positioning. Over a Benson wire, a 10.2-French Cook cholecystomy tube was advanced into the gallbladder fossa, coiled and locked. Bile was aspirated and a small amount of contrast was injected as several post procedural spot radiographic images were obtained in various obliquities. The catheter was secured to the skin with suture, connected to a drainage bag and a dressing was placed. The patient tolerated the procedure well without immediate post procedural complication. IMPRESSION: 1. Distended gallbladder with gallbladder wall thickening. 2. Successful ultrasound and fluoroscopic guided placement of a 10 Fr percutaneous cholecystostomy tube, via a transhepatic approach. RECOMMENDATIONS: The patient will return to Vascular Interventional Radiology (VIR) for routine drainage catheter evaluation and exchange in 6 weeks. Roanna Banning, MD [facility] Electronically Signed   By: Roanna Banning M.D.   On: 05/28/2023 16:37    Scheduled Meds:  apixaban  5 mg Oral BID   Chlorhexidine Gluconate Cloth  6 each Topical Daily   ciprofloxacin  500 mg Oral BID   feeding supplement (GLUCERNA SHAKE)  237 mL Oral TID BM   insulin aspart  0-15 Units Subcutaneous TID WC   insulin aspart  0-5 Units Subcutaneous QHS   ipratropium-albuterol  3 mL Nebulization BID   melatonin  5 mg Oral QHS   metroNIDAZOLE  500 mg Oral Q12H   sodium chloride flush  5 mL Intracatheter Q8H    tamsulosin  0.4 mg Oral Daily   Continuous Infusions:  diltiazem (CARDIZEM) infusion 10 mg/hr (05/30/23 1036)     LOS: 4 days   Burnadette Pop, MD [facility] P2/13/2025, 10:49 AM

## 2023-05-30 NOTE — Progress Notes (Addendum)
Patient Name: Jonathan Owen Date of Encounter: 05/30/2023 [facility] Cardiologist: None   Interval Summary  .    Slightly more confused today however resting comfortably.  Daughter is in the room.  No complaints.  Had an episode of chest pain yesterday, EKG did not show any significant changes.  No further recurrences. Vital Signs .    Vitals:   05/30/23 0645 05/30/23 0700 05/30/23 0800 05/30/23 0819  BP: (!) 133/53 (!) 131/56 (!) 130/56   Pulse: 100 100 (!) 104   Resp: (!) 30 (!) 30 (!) 30   Temp:      TempSrc:      SpO2: 97% 96% 96% 96%  Weight:      Height:        Intake/Output Summary (Last 24 hours) at 05/30/2023 0901 Last data filed at 05/30/2023 0836 Gross per 24 hour  Intake 1113.59 ml  Output 1270 ml  Net -156.41 ml      05/27/2023   11:56 AM 05/26/2023   12:00 AM 11/25/2012   12:19 PM  Last 3 Weights  Weight (lbs) 215 lb 2.7 oz 205 lb 7.5 oz 206 lb 9.6 oz  Weight (kg) 97.6 kg 93.2 kg 93.713 kg      Telemetry/ECG    Atrial fibrillation heart rates generally in the 90s- Personally Reviewed  CV Studies    Echocardiogram 05/26/2023 1. Left ventricular ejection fraction, by estimation, is 60 to 65%. The  left ventricle has normal function. Left ventricular endocardial border  not optimally defined to evaluate regional wall motion. Left ventricular  diastolic parameters are consistent  with Grade I diastolic dysfunction (impaired relaxation).   2. Right ventricular systolic function was not well visualized. The right  ventricular size is normal.   3. Left atrial size was moderately dilated.   4. The mitral valve is grossly normal. No evidence of mitral valve  regurgitation. No evidence of mitral stenosis.   5. The aortic valve was not well visualized. There is moderate  calcification of the aortic valve. Aortic valve regurgitation is not  visualized. Aortic valve sclerosis/calcification is present, without any  evidence of aortic stenosis.  Aortic valve area,  by VTI measures 2.09 cm. Aortic valve mean gradient measures 11.0 mmHg.  Aortic valve Vmax measures 1.92 m/s.   6. Aortic dilatation noted. There is mild dilatation of the aortic root,  measuring 42 mm. Normal size of ascending aorta.   Comparison(s): No prior Echocardiogram.     Physical Exam .   GEN: No acute distress.   Neck: No JVD Cardiac: IRRR Respiratory: Clear to auscultation bilaterally. GI: Soft, nontender, non-distended  MS: No edema  Patient Profile    Jonathan Owen is a 89 y.o. male has hx of diabetes, hypertension, colon cancer, OA.  Admitted for cholecystitis found to be in new onset atrial fibrillation.  Assessment & Plan .     New onset atrial fibrillation  Likely secondary to acute cholecystitis.  Controlled ventricular rates on diltiazem drip 15.  Echo shows preserved EF 60-65% with not well-visualized RV function.  Moderately dilated LA.   Still remains on diltiazem drip at 15, heart rates are generally steady between 90-110.  This has been fixed. Goal today to start weaning down today to see what is p.o. dosing should be, aiming to have lower p.o. dose as diltiazem greater than 120 mg with Eliquis is associated with higher risk of bleeding.  Continue Eliquis 5 mg twice daily.  Patient and  daughter are okay with anticoagulation. Plans for eventual DCCV, after atleast 3 weeks of DOAC.  Normal TSH.  Sepsis secondary to acute cholecystitis Per primary team.  Status post drainage tubes.  For questions or updates, please contact [facility] Please consult [email or web address] for contact info under    Signed, Abagail Kitchens, PA-C   Patient seen and examined, note reviewed with the signed Advanced Practice Provider. I personally reviewed laboratory data, imaging studies and relevant notes. I independently examined the patient and formulated the important aspects of the plan. I have personally discussed the plan with the patient and/or  family. Comments or changes to the note/plan are indicated below.  From a cardiovascular standpoint: Stop the Cardizem drip start the patient on Cardizem 180 mg daily (for now use 60 mg every 8 hours).  Can titrate up Cardizem as blood pressure can tolerate.  Will continue Eliquis.  Will sign off at this time.  Thomasene Ripple DO, MS [facility] Attending Cardiologist [facility]  [address] [place], [place] [address] [phone] Website: [email or web address]

## 2023-05-30 NOTE — [place] FL2 (Signed)
[place] MEDICAID FL2 LEVEL OF CARE FORM     IDENTIFICATION  Patient Name: Jonathan Owen Birthdate: 08-Dec-1927 Sex: male Admission Date (Current Location): 05/25/2023  [facility] and [place] Number:  Producer, television/film/video and Address:  [facility],  [address] [place], [place] [address]      Provider Number: [id number]  Attending Physician Name and Address:  Burnadette Pop, MD  Relative Name and Phone Number:  Diangelo Radel (daughter) Ph: [phone]    Current Level of Care: Hospital Recommended Level of Care: Skilled Nursing Facility Prior Approval Number:    Date Approved/Denied:   PASRR Number: [id number]  Discharge Plan: SNF    Current Diagnoses: Patient Active Problem List   Diagnosis Date Noted   PAF (paroxysmal atrial fibrillation) (HCC) 05/28/2023   Medication management 05/28/2023   Hyponatremia 05/26/2023   Chronic atrial fibrillation with RVR (HCC) 05/26/2023   Sepsis (HCC) 05/26/2023   Atrial fibrillation with RVR (HCC) 05/26/2023   Spinal stenosis of lumbar region with neurogenic claudication 09/13/2022   Age-related osteoporosis without current pathological fracture 09/19/2020   Low back pain 03/22/2020   Displaced intertrochanteric fracture of left femur, subsequent encounter for closed fracture with routine healing 07/21/2018   History of malignant neoplasm of colon 07/21/2018   HTN (hypertension) 07/15/2018   Type 2 diabetes mellitus without complication (HCC) 08/01/2015   Breast mass in male 11/13/2012    Orientation RESPIRATION BLADDER Height & Weight     Self, Time, Situation, Place  Normal Continent Weight: 215 lb 2.7 oz (97.6 kg) Height:  5\' 11"  (180.3 cm)  BEHAVIORAL SYMPTOMS/MOOD NEUROLOGICAL BOWEL NUTRITION STATUS      Continent Diet (Regular diet)  AMBULATORY STATUS COMMUNICATION OF NEEDS Skin   Extensive Assist Verbally Other (Comment) (Rash: groin, abdomen; Erythema: bilateral arms, buttocks; Ecchymosis: scattered)                        Personal Care Assistance Level of Assistance  Bathing, Feeding, Dressing Bathing Assistance: Maximum assistance Feeding assistance: Limited assistance Dressing Assistance: Maximum assistance     Functional Limitations Info  Sight, Hearing, Speech Sight Info: Impaired Hearing Info: Adequate Speech Info: Adequate    SPECIAL CARE FACTORS FREQUENCY  PT (By licensed PT), OT (By licensed OT)     PT Frequency: 5x's/week OT Frequency: 5x's/week            Contractures Contractures Info: Not present    Additional Factors Info  Code Status, Allergies Code Status Info: Full Allergies Info: Tape           Current Medications (05/30/2023):  This is the current hospital active medication list Current Facility-Administered Medications  Medication Dose Route Frequency Provider Last Rate Last Admin   acetaminophen (TYLENOL) tablet 650 mg  650 mg Oral Q6H PRN Rometta Emery, MD   650 mg at 05/28/23 1414   Or   acetaminophen (TYLENOL) suppository 650 mg  650 mg Rectal Q6H PRN Rometta Emery, MD       apixaban (ELIQUIS) tablet 5 mg  5 mg Oral BID Yvonna Alanis L, PA-C   5 mg at 05/30/23 6644   Chlorhexidine Gluconate Cloth 2 % PADS 6 each  6 each Topical Daily Burnadette Pop, MD   6 each at 05/30/23 0931   ciprofloxacin (CIPRO) tablet 500 mg  500 mg Oral BID Burnadette Pop, MD   500 mg at 05/30/23 1031   clonazePAM (KLONOPIN) tablet 0.5 mg  0.5 mg Oral  BID PRN Burnadette Pop, MD   0.5 mg at 05/26/23 2255   diltiazem (CARDIZEM) tablet 60 mg  60 mg Oral Q8H Tobb, Kardie, DO       feeding supplement (GLUCERNA SHAKE) (GLUCERNA SHAKE) liquid 237 mL  237 mL Oral TID BM Adhikari, Amrit, MD       haloperidol lactate (HALDOL) injection 2 mg  2 mg Intravenous Q6H PRN Burnadette Pop, MD       HYDROmorphone (DILAUDID) injection 0.5 mg  0.5 mg Intravenous Q3H PRN Burnadette Pop, MD   0.5 mg at 05/29/23 1430   insulin aspart (novoLOG) injection 0-15 Units  0-15 Units  Subcutaneous TID WC Rometta Emery, MD   8 Units at 05/30/23 1201   insulin aspart (novoLOG) injection 0-5 Units  0-5 Units Subcutaneous QHS Rometta Emery, MD   2 Units at 05/29/23 2300   insulin glargine-yfgn (SEMGLEE) injection 10 Units  10 Units Subcutaneous Daily Burnadette Pop, MD   10 Units at 05/30/23 1200   ipratropium-albuterol (DUONEB) 0.5-2.5 (3) MG/3ML nebulizer solution 3 mL  3 mL Nebulization BID Burnadette Pop, MD       melatonin tablet 5 mg  5 mg Oral QHS Burnadette Pop, MD   5 mg at 05/29/23 2300   metroNIDAZOLE (FLAGYL) tablet 500 mg  500 mg Oral Q12H Adhikari, Willia Craze, MD   500 mg at 05/30/23 1031   nitroGLYCERIN (NITROSTAT) SL tablet 0.4 mg  0.4 mg Sublingual Q5 min PRN Burnadette Pop, MD       ondansetron (ZOFRAN) tablet 4 mg  4 mg Oral Q6H PRN Rometta Emery, MD       Or   ondansetron (ZOFRAN) injection 4 mg  4 mg Intravenous Q6H PRN Rometta Emery, MD       Oral care mouth rinse  15 mL Mouth Rinse PRN Adhikari, Amrit, MD       oxyCODONE (Oxy IR/ROXICODONE) immediate release tablet 5 mg  5 mg Oral Q6H PRN Burnadette Pop, MD   5 mg at 05/28/23 1414   sodium chloride flush (NS) 0.9 % injection 5 mL  5 mL Intracatheter Q8H Mugweru, Jon, MD   5 mL at 05/30/23 0645   tamsulosin (FLOMAX) capsule 0.4 mg  0.4 mg Oral Daily Earlie Lou L, MD   0.4 mg at 05/30/23 0930     Discharge Medications: Please see discharge summary for a list of discharge medications.  Relevant Imaging Results:  Relevant Lab Results:   Additional Information SSN: [id number]  Ewing Schlein, LCSW

## 2023-05-30 NOTE — Plan of Care (Signed)

## 2023-05-30 NOTE — Evaluation (Signed)
Clinical/Bedside Swallow Evaluation Patient Details  Name: Jonathan Owen MRN: [id number] Date of Birth: April 20, 1927  Today's Date: 05/30/2023 Time: SLP Start Time (ACUTE ONLY): 1120 SLP Stop Time (ACUTE ONLY): 1148 SLP Time Calculation (min) (ACUTE ONLY): 28 min  Past Medical History:  Past Medical History:  Diagnosis Date   Arthritis    Cancer (HCC)    hx colon cancer   Diabetes mellitus without complication (HCC)    no meds-watches diet   Hypertension    no meds now-   Wears dentures    upper-partial bottom   Wears glasses    Past Surgical History:  Past Surgical History:  Procedure Laterality Date   CARPAL TUNNEL RELEASE  03/04/2012   Procedure: CARPAL TUNNEL RELEASE;  Surgeon: Nicki Reaper, MD;  Location: [facility];  Service: Orthopedics;  Laterality: Right;   CARPAL TUNNEL RELEASE Left 09/09/2012   Procedure: CARPAL TUNNEL RELEASE;  Surgeon: Nicki Reaper, MD;  Location: [facility];  Service: Orthopedics;  Laterality: Left;   COLONOSCOPY     COLONOSCOPY W/ BIOPSIES AND POLYPECTOMY     IR PERC CHOLECYSTOSTOMY  05/28/2023   PARTIAL COLECTOMY  2006   sigmoid   STERNOTOMY  2006   medial-thymectomy   UPPER GASTROINTESTINAL ENDOSCOPY  2006   HPI:  Jonathan Owen is a 89 y.o. male with medical history significant of non-insulin-dependent diabetes, essential hypertension, history of colon cancer, osteoarthritis, who presents from home with shortness of breath that started on 05/26/23 with chills and fever.  He has been having flulike symptoms in the last few weeks on and off.  Patient on arrival had a temperature of 101 and heart rate of 131.  Appeared to have sepsis but the source is not clear.  Chest x-ray showed no acute findings.   With the shortness of breath early pneumonia suspected.  While in the ER patient went into A-fib with RVR which is new for the patient.  No prior history of A-fib.  At this point we will admit the patient for treatment  of sepsis of unknown cause as well as atrial fibrillation with rapid ventricular response. ST consulted for swallow evaluation.    Assessment / Plan / Recommendation  Clinical Impression  Pt seen for swallow evaluation with various consistencies with a normal oropharyngeal swallow observed with thin via cup/straw, puree and soft solids (mandarin oranges).  Slight decreased mastication d/t missing dentition/ill-fitting dentures (top), but family informed SLP pt consumes regular food/liquids at home requiring meat/vegetables to be cut up prior to consuming.  May consider either initiating a mechanical/soft diet and/or pt preferred regular foods with thin liquids.  General swallowing precautions recommended with slow rate and small bites/sips d/t deconditioning and presbyphagia (age related swallowing) impact.  No ST f/u required.  Education completed with pt/daughter (via phone)/caregiver.  ST will s/o in acute setting.   SLP Visit Diagnosis: Dysphagia, unspecified (R13.10)    Aspiration Risk  No limitations    Diet Recommendation   Thin;Age appropriate regular  Medication Administration: Whole meds with liquid    Other  Recommendations Oral Care Recommendations: Oral care BID;Staff/trained caregiver to provide oral care    Recommendations for follow up therapy are one component of a multi-disciplinary discharge planning process, led by the attending physician.  Recommendations may be updated based on patient status, additional functional criteria and insurance authorization.  Follow up Recommendations No SLP follow up      Assistance Recommended at Discharge  TBD  Functional Status Assessment Patient has had a recent decline in their functional status and demonstrates the ability to make significant improvements in function in a reasonable and predictable amount of time.  Frequency and Duration  (evaluation only)          Prognosis Prognosis for improved oropharyngeal function: Good       Swallow Study   General Date of Onset: 05/26/23 HPI: Jonathan Owen is a 89 y.o. male with medical history significant of non-insulin-dependent diabetes, essential hypertension, history of colon cancer, osteoarthritis, who presents from home with shortness of breath that started last night with chills and fever.  He has been having flulike symptoms in the last few weeks on and off.  Patient on arrival has a temperature of 101 and heart rate of 131.  He is having significant chills.  Appears to have sepsis but the source is not clear.  Urinalysis is negative.  Chest x-ray showed no acute findings.  No other obvious source at the moment.  With the shortness of breath early pneumonia suspected.  While in the ER patient went into A-fib with RVR which is new for the patient.  No prior history of A-fib.  At this point we will admit the patient for treatment of sepsis of unknown cause as well as atrial fibrillation with rapid ventricular response. ST consulted for swallow evaluation. Type of Study: Bedside Swallow Evaluation Previous Swallow Assessment: n/a Diet Prior to this Study: Regular;Thin liquids (Level 0) Temperature Spikes Noted: No Respiratory Status: Room air History of Recent Intubation: No Behavior/Cognition: Alert;Cooperative;Pleasant mood Oral Cavity Assessment: Within Functional Limits Oral Care Completed by SLP: Yes Oral Cavity - Dentition: Dentures, top;Missing dentition Vision: Functional for self-feeding Self-Feeding Abilities: Needs assist Patient Positioning: Upright in bed Baseline Vocal Quality: Normal Volitional Cough: Strong Volitional Swallow: Able to elicit    Oral/Motor/Sensory Function Overall Oral Motor/Sensory Function: Within functional limits   Ice Chips Ice chips: Not tested   Thin Liquid Thin Liquid: Within functional limits Presentation: Cup;Straw    Nectar Thick Nectar Thick Liquid: Not tested   Honey Thick Honey Thick Liquid: Not tested   Puree Puree:  Within functional limits Presentation: Self Fed;Spoon   Solid     Solid: Within functional limits Presentation: Self Fed Other Comments: small bites      Pat Jupiter Boys,M.S.,CCC-SLP 05/30/2023,12:03 PM

## 2023-05-30 NOTE — Progress Notes (Signed)
Daily Progress Note   Patient Name: Jonathan Owen       Date: 05/30/2023 DOB: 15-Jun-1927  Age: 89 y.o. MRN#: [id number] Attending Physician: Burnadette Pop, MD Primary Care Physician: Noni Saupe, MD Admit Date: 05/25/2023  Reason for Consultation/Follow-up: Establishing goals of care  Patient Profile/HPI:  89 y.o. male  with past medical history of DM2, HTN, remote history of colon cancer s/p L colectomy- no evidence of recurrence, osteoarthritis, hx PE admitted on 05/25/2023 with sepsis d/t acute cholecystitis. Has had drain placed by IR and on IV antibiotics. Having a-fib with RVR- on cardizem and heparin infusions- cards recommend transition to Eliquis.  Palliative medicine consulted for goals of care.    Subjective: Chart reviewed including labs, progress notes, imaging from this and previous encounters.  Patient awake, but lethargic. Caregiver at bedside. He is oriented to person and place but not situation or time- not yet in a state where he can participate in goals of care discussion.  Called daughter [name]- she agreed that he was less coherent today.  Encouraged [name] to continue goals of care and advanced care planning with patient within the context of his acute and chronic illnesses. Noted it was better to talk about in a place of calm before decisions are needed urgently.   Review of Systems  Unable to perform ROS: Mental status change     Physical Exam Vitals and nursing note reviewed.  Constitutional:      Appearance: He is ill-appearing.  Cardiovascular:     Rate and Rhythm: Normal rate.  Pulmonary:     Effort: Pulmonary effort is normal.  Neurological:     Comments: lethargic             Vital Signs: BP (!) 121/58   Pulse 93   Temp 98.2 F (36.8 C)  (Axillary)   Resp (!) 30   Ht 5\' 11"  (1.803 m)   Wt 97.6 kg   SpO2 97%   BMI 30.01 kg/m  SpO2: SpO2: 97 % O2 Device: O2 Device: Room Air O2 Flow Rate: O2 Flow Rate (L/min): 2 L/min  Intake/output summary:  Intake/Output Summary (Last 24 hours) at 05/30/2023 1132 Last data filed at 05/30/2023 1059 Gross per 24 hour  Intake 1144.92 ml  Output 1270 ml  Net -125.08 ml  LBM: Last BM Date :  (PTA) Baseline Weight: Weight: 93.2 kg Most recent weight: Weight: 97.6 kg       Palliative Assessment/Data:      Patient Active Problem List   Diagnosis Date Noted   PAF (paroxysmal atrial fibrillation) (HCC) 05/28/2023   Medication management 05/28/2023   Hyponatremia 05/26/2023   Chronic atrial fibrillation with RVR (HCC) 05/26/2023   Sepsis (HCC) 05/26/2023   Atrial fibrillation with RVR (HCC) 05/26/2023   Spinal stenosis of lumbar region with neurogenic claudication 09/13/2022   Age-related osteoporosis without current pathological fracture 09/19/2020   Low back pain 03/22/2020   Displaced intertrochanteric fracture of left femur, subsequent encounter for closed fracture with routine healing 07/21/2018   History of malignant neoplasm of colon 07/21/2018   HTN (hypertension) 07/15/2018   Type 2 diabetes mellitus without complication (HCC) 08/01/2015   Breast mass in male 11/13/2012    Palliative Care Assessment & Plan    Assessment/Recommendations/Plan  Continue full scope, full code- [name] is hopeful that patient's mental status will improve and he will be able to participate in goals of care discussion at a later time PMT will followup on Sunday   Code Status:   Code Status: Full Code   Prognosis:  Unable to determine  Discharge Planning: To Be Determined  Care plan was discussed with daughter [name].  Thank you for allowing the Palliative Medicine Team to assist in the care of this patient.  Total time:  45 mins Prolonged billing:  Time includes:   Preparing  to see the patient (e.g., review of tests) Obtaining and/or reviewing separately obtained history Performing a medically necessary appropriate examination and/or evaluation Counseling and educating the patient/family/caregiver Ordering medications, tests, or procedures Referring and communicating with other health care professionals (when not reported separately) Documenting clinical information in the electronic or other health record Independently interpreting results (not reported separately) and communicating results to the patient/family/caregiver Care coordination (not reported separately) Clinical documentation  Ocie Bob, AGNP-C Palliative Medicine   Please contact Palliative Medicine Team phone at [phone] for questions and concerns.

## 2023-05-30 NOTE — TOC Progression Note (Signed)
Transition of Care [facility]) - Progression Note   Patient Details  Name: Jonathan Owen MRN: [id number] Date of Birth: [birth year]  [facility] CM/SW Contact  Ewing Schlein, LCSW Phone Number: 05/30/2023, 1:25 PM  Clinical Narrative: CSW spoke with daughter, Jonathan Owen, regarding SNF. Daughter agreeable to patient being faxed out. Daughter reported the family would prefer [facility], [facility], [facility], or possibly [facility]. Per daughter, patient can also private pay for rehab if needed. FL2 done; PASRR confirmed. Initial referral faxed out in hub. TOC awaiting bed offers.  Expected Discharge Plan: Skilled Nursing Facility Barriers to Discharge: Continued Medical Work up  Expected Discharge Plan and Services In-house Referral: Clinical Social Work Post Acute Care Choice: Skilled Nursing Facility Living arrangements for the past 2 months: Single Family Home            DME Arranged: N/A DME Agency: NA  Social Determinants of Health (SDOH) Interventions SDOH Screenings   Food Insecurity: No Food Insecurity (05/26/2023)  Housing: Low Risk  (05/26/2023)  Transportation Needs: No Transportation Needs (05/26/2023)  Utilities: Not At Risk (05/26/2023)  Social Connections: Unknown (05/26/2023)  Tobacco Use: Low Risk  (05/25/2023)   Readmission Risk Interventions     No data to display

## 2023-05-30 NOTE — Plan of Care (Signed)
  Problem: Fluid Volume: Goal: Ability to maintain a balanced intake and output will improve Outcome: Progressing   Problem: Metabolic: Goal: Ability to maintain appropriate glucose levels will improve Outcome: Progressing   Problem: Nutritional: Goal: Maintenance of adequate nutrition will improve Outcome: Progressing   Problem: Skin Integrity: Goal: Risk for impaired skin integrity will decrease Outcome: Progressing   Problem: Clinical Measurements: Goal: Diagnostic test results will improve Outcome: Progressing Goal: Signs and symptoms of infection will decrease Outcome: Progressing

## 2023-05-31 DIAGNOSIS — A419 Sepsis, unspecified organism: Secondary | ICD-10-CM | POA: Diagnosis not present

## 2023-05-31 LAB — CBC
HCT: 37.2 % — ABNORMAL LOW (ref 39.0–52.0)
Hemoglobin: 11.6 g/dL — ABNORMAL LOW (ref 13.0–17.0)
MCH: 28.6 pg (ref 26.0–34.0)
MCHC: 31.2 g/dL (ref 30.0–36.0)
MCV: 91.9 fL (ref 80.0–100.0)
Platelets: 350 10*3/uL (ref 150–400)
RBC: 4.05 MIL/uL — ABNORMAL LOW (ref 4.22–5.81)
RDW: 14.2 % (ref 11.5–15.5)
WBC: 11.1 10*3/uL — ABNORMAL HIGH (ref 4.0–10.5)
nRBC: 0 % (ref 0.0–0.2)

## 2023-05-31 LAB — GLUCOSE, CAPILLARY
Glucose-Capillary: 156 mg/dL — ABNORMAL HIGH (ref 70–99)
Glucose-Capillary: 212 mg/dL — ABNORMAL HIGH (ref 70–99)
Glucose-Capillary: 180 mg/dL — ABNORMAL HIGH (ref 70–99)
Glucose-Capillary: 166 mg/dL — ABNORMAL HIGH (ref 70–99)

## 2023-05-31 LAB — CULTURE, BLOOD (ROUTINE X 2): Culture: NO GROWTH

## 2023-05-31 LAB — BASIC METABOLIC PANEL
Anion gap: 10 (ref 5–15)
BUN: 20 mg/dL (ref 8–23)
CO2: 23 mmol/L (ref 22–32)
Calcium: 8.2 mg/dL — ABNORMAL LOW (ref 8.9–10.3)
Chloride: 100 mmol/L (ref 98–111)
Creatinine, Ser: 0.59 mg/dL — ABNORMAL LOW (ref 0.61–1.24)
GFR, Estimated: >60 mL/min (ref >60)
Glucose, Bld: 203 mg/dL — ABNORMAL HIGH (ref 70–99)
Potassium: 4 mmol/L (ref 3.5–5.1)
Sodium: 133 mmol/L — ABNORMAL LOW (ref 135–145)

## 2023-05-31 LAB — MAGNESIUM: Magnesium: 2 mg/dL (ref 1.7–2.4)

## 2023-05-31 MED ORDER — DILTIAZEM HCL ER COATED BEADS 180 MG PO CP24
180.0000 mg | ORAL_CAPSULE | Freq: Every day | ORAL | Status: DC
  Administered 2023-05-31 – 2023-06-03 (×4): 180 mg via ORAL
  Filled 2023-05-31 (×4): qty 1

## 2023-05-31 MED ORDER — SENNOSIDES-DOCUSATE SODIUM 8.6-50 MG PO TABS
1.0000 | ORAL_TABLET | Freq: Two times a day (BID) | ORAL | Status: DC
  Administered 2023-05-31 – 2023-06-02 (×5): 1 via ORAL
  Filled 2023-05-31 (×6): qty 1

## 2023-05-31 MED ORDER — POLYETHYLENE GLYCOL 3350 17 G PO PACK
17.0000 g | PACK | Freq: Every day | ORAL | Status: DC
  Administered 2023-05-31 – 2023-06-02 (×3): 17 g via ORAL
  Filled 2023-05-31 (×4): qty 1

## 2023-05-31 NOTE — Plan of Care (Signed)
  Problem: Metabolic: Goal: Ability to maintain appropriate glucose levels will improve Outcome: Progressing   Problem: Nutritional: Goal: Maintenance of adequate nutrition will improve Outcome: Progressing   Problem: Skin Integrity: Goal: Risk for impaired skin integrity will decrease Outcome: Progressing   Problem: Clinical Measurements: Goal: Diagnostic test results will improve Outcome: Progressing   Problem: Respiratory: Goal: Ability to maintain adequate ventilation will improve Outcome: Progressing

## 2023-05-31 NOTE — TOC Progression Note (Addendum)
Transition of Care [facility]) - Progression Note   Patient Details  Name: [name] MRN: [id number] Date of Birth: 05-Jan-1928  Transition [facility]) CM/SW Contact  Ewing Schlein, LCSW Phone Number: 05/31/2023, 3:06 PM  Clinical Narrative: Patient received the following bed offers:  [facility] [address] [place], [place] [address] [phone] Overall rating ???? Above average  [facility] [address] [place], [place] [address] [phone] Overall rating ???? Above average  CSW spoke with daughter regarding bed offers. Daughter reported [place] is too far for her to travel and does not want the patient to go to [facility]. Daughter requested that CSW reached out to [place], [place], [place], and [place]. Daughter also asked about SNFs in [place]. CSW explained daughter would need to find a facility in [place] for CSW to fax a referral to as [facility] is not able to individually contact SNFs outside of the service area. Daughter reported she would not be able to call SNFs until Monday (06/03/23).  CSW reached out to [place] with [facility], [place] with [facility], [place] with [facility], and [place] with [facility] to have the referral reviewed. Per Alphonzo Lemmings, [facility] cannot take the patient's [facility] Medicare plan so the referral was denied. [facility] [place] and [place] reviewing referral. [place] with [facility] able to make a bed offer, but will not have an open bed until 06/02/22.  [facility] [address] [place], [place] [address] [phone] Overall rating ??? Average  Addendum: CSW notified by Lawer[facility] Cru[facility] with [facility] that a bed offer can be made, but the facility likely will not have a bed until 06/02/22.  [facility] [address] [place], [place] [address] [phone] Overall rating ????? Much above average  Expected Discharge Plan: Skilled Nursing  Facility Barriers to Discharge: Continued Medical Work up  Expected Discharge Plan and Services In-house Referral: Clinical Social Work Post Acute Care Choice: Skilled Nursing Facility Living arrangements for the past 2 months: Single Family Home            DME Arranged: N/A DME Agency: NA  Social Determinants of Health (SDOH) Interventions SDOH Screenings   Food Insecurity: No Food Insecurity (05/26/2023)  Housing: Low Risk  (05/26/2023)  Transportation Needs: No Transportation Needs (05/26/2023)  Utilities: Not At Risk (05/26/2023)  Social Connections: Unknown (05/26/2023)  Tobacco Use: Low Risk  (05/25/2023)   Readmission Risk Interventions     No data to display

## 2023-05-31 NOTE — Plan of Care (Signed)
Problem: Coping: Goal: Ability to adjust to condition or change in health will improve Outcome: Progressing   Problem: Health Behavior/Discharge Planning: Goal: Ability to manage health-related needs will improve Outcome: Progressing   Problem: Tissue Perfusion: Goal: Adequacy of tissue perfusion will improve Outcome: Progressing

## 2023-05-31 NOTE — Plan of Care (Signed)
Pt reoriented multiple times throughout the night.  Delirium protocol.  Denies pain.  Biliary drain remains patent and without s/s complications. Problem: Education: Goal: Ability to describe self-care measures that may prevent or decrease complications (Diabetes Survival Skills Education) will improve Outcome: Progressing Goal: Individualized Educational Video(s) Outcome: Progressing   Problem: Coping: Goal: Ability to adjust to condition or change in health will improve Outcome: Progressing   Problem: Fluid Volume: Goal: Ability to maintain a balanced intake and output will improve Outcome: Progressing   Problem: Health Behavior/Discharge Planning: Goal: Ability to identify and utilize available resources and services will improve Outcome: Progressing Goal: Ability to manage health-related needs will improve Outcome: Progressing   Problem: Metabolic: Goal: Ability to maintain appropriate glucose levels will improve Outcome: Progressing   Problem: Nutritional: Goal: Maintenance of adequate nutrition will improve Outcome: Progressing Goal: Progress toward achieving an optimal weight will improve Outcome: Progressing   Problem: Skin Integrity: Goal: Risk for impaired skin integrity will decrease Outcome: Progressing   Problem: Tissue Perfusion: Goal: Adequacy of tissue perfusion will improve Outcome: Progressing   Problem: Fluid Volume: Goal: Hemodynamic stability will improve Outcome: Progressing   Problem: Clinical Measurements: Goal: Diagnostic test results will improve Outcome: Progressing Goal: Signs and symptoms of infection will decrease Outcome: Progressing   Problem: Respiratory: Goal: Ability to maintain adequate ventilation will improve Outcome: Progressing   Problem: Education: Goal: Knowledge of General Education information will improve Description: Including pain rating scale, medication(s)/side effects and non-pharmacologic comfort  measures Outcome: Progressing   Problem: Health Behavior/Discharge Planning: Goal: Ability to manage health-related needs will improve Outcome: Progressing   Problem: Clinical Measurements: Goal: Ability to maintain clinical measurements within normal limits will improve Outcome: Progressing Goal: Will remain free from infection Outcome: Progressing Goal: Diagnostic test results will improve Outcome: Progressing Goal: Respiratory complications will improve Outcome: Progressing Goal: Cardiovascular complication will be avoided Outcome: Progressing   Problem: Activity: Goal: Risk for activity intolerance will decrease Outcome: Progressing   Problem: Nutrition: Goal: Adequate nutrition will be maintained Outcome: Progressing   Problem: Coping: Goal: Level of anxiety will decrease Outcome: Progressing   Problem: Elimination: Goal: Will not experience complications related to bowel motility Outcome: Progressing Goal: Will not experience complications related to urinary retention Outcome: Progressing   Problem: Pain Managment: Goal: General experience of comfort will improve and/or be controlled Outcome: Progressing   Problem: Safety: Goal: Ability to remain free from injury will improve Outcome: Progressing   Problem: Skin Integrity: Goal: Risk for impaired skin integrity will decrease Outcome: Progressing

## 2023-05-31 NOTE — Progress Notes (Addendum)
 PROGRESS NOTE  Jonathan Owen  QMV:[id number] DOB: [birth year] DOA: 05/25/2023 PCP: Noni Saupe, MD   Brief Narrative: Patient is a 89 male with history of insulin-dependent diabetes type 2, hypertension, colon cancer, osteoarthritis, prior PE who presented with shortness of breath, fever, chills, weakness from home. He felt like being smothered. On presentation, he was tachycardiac, febrile.  Suspected to have sepsis of unclear etiology.  UA was not suspicoius  UTI.  Chest x-ray did not show any pneumonia.  Also went to A-fib with RVR in the emergency department.  Ultrasound of the abdomen/CT abdomen/pelvis showed acute cholecystitis. HIDA positive.  General surgery, cardiology consulted. S/P IR guided percutaneous cholecystostomy drain on 2/11.  Clinically improving now.  PT recommending SNF on discharge.  Being transferred out of stepdown   Assessment & Plan:  Principal Problem:   Sepsis (HCC) Active Problems:   HTN (hypertension)   Type 2 diabetes mellitus without complication (HCC)   Hyponatremia   Atrial fibrillation with RVR (HCC)   PAF (paroxysmal atrial fibrillation) (HCC)   Medication management   Sepsis: Presented with fever, leukocytosis, tachycardia.  He did not complain of severe abdominal pain on admission, right upper quadrant is mostly nontender. Blood  anaerobic bottle culture sets showed gram-positive cocci, most likely contamination.  Aerobic/anaerobic culture showed ESBL E. coli.  Antibiotics changed to  ciprofloxacin and flagyl  as per ID pharmacy.plan to continue these antibiotics for 2 weeks course .currently blood pressure stable.  Had leukocytosis but now resolved.  Afebrile this morning  Acute cholecystitis: Presented with fever.  Liver enzymes normal.  CT abdomen/pelvis, right upper quadrant ultrasound suggestive of acute cholecystitis.  HIDA came out to be positive.  General surgery consulted.General Surgery recommended IR guided drain placement.  S/P   percutaneous cholecystostomy drain placement on 2/11.  He will continue with drain on discharge and follow-up with  IR as an outpatient  New onset A-fib with RVR: Started on Cardizem drip.  Cardiology consulted and was following.   Anticoagulation changed to Eliquis.  Now on diltiazem 180 mg daily.  Cardiology will follow him as an outpatient and plan for DCCV after at least 3 weeks of anticoagulation.  Insulin-dependent diabetes type 2: Recent A1c of 6.1.  Takes insulin at home.  Continue sliding scale, long-acting insulin.  Monitor blood sugars  Hyponatremia: Improved  Hypertension: Currently BP stable.  Weakness: Patient lives alone.  Ambulatory at baseline.  PT recommended SNF on discharge  Confusion: Alert and oriented at baseline.  This is most likely hospital-acquired delirium.  Minimize sedatives, narcotics continue frequent reorientation, delirium precautions.  This morning he was alert and oriented.  Goals of care: Goals of care discharge with daughter [name].  She is expecting full recovery.  Patient is usually fully independent and healthy.  Patient remains full code.  Palliative care also consulted   Pressure Injury 05/27/23 Sacrum Medial Stage 1 -  Intact skin with non-blanchable redness of a localized area usually over a bony prominence. (Active)  05/27/23 1200  Location: Sacrum  Location Orientation: Medial  Staging: Stage 1 -  Intact skin with non-blanchable redness of a localized area usually over a bony prominence.  Wound Description (Comments):   Present on Admission: Yes  Dressing Type Foam - Lift dressing to assess site every shift 05/30/23 2000    DVT prophylaxis:iv heparin apixaban (ELIQUIS) tablet 5 mg     Code Status: Full Code  Family Communication: Discussed with daughter at bedside on 2/14  Patient status:  Inpatient  Patient is from : Home  Anticipated discharge to: SNF  Estimated DC date: 2-3 days, likely on Monday   Consultants: General surgery,  cardiology,IR  Procedures: Right upper quadrant drain placement  Antimicrobials:  Anti-infectives (From admission, onward)    Start     Dose/Rate Route Frequency Ordered Stop   05/30/23 1045  ciprofloxacin (CIPRO) tablet 500 mg        500 mg Oral 2 times daily 05/30/23 0957     05/30/23 1045  metroNIDAZOLE (FLAGYL) tablet 500 mg        500 mg Oral Every 12 hours 05/30/23 0957     05/28/23 1200  piperacillin-tazobactam (ZOSYN) IVPB 3.375 g  Status:  Discontinued        3.375 g 12.5 mL/hr over 240 Minutes Intravenous Every 8 hours 05/28/23 1051 05/30/23 0957   05/27/23 2200  vancomycin (VANCOCIN) IVPB 1000 mg/200 mL premix  Status:  Discontinued        1,000 mg 200 mL/hr over 60 Minutes Intravenous Every 24 hours 05/27/23 0724 05/27/23 1322   05/26/23 2200  vancomycin (VANCOREADY) IVPB 1750 mg/350 mL  Status:  Discontinued        1,750 mg 175 mL/hr over 120 Minutes Intravenous Every 24 hours 05/26/23 0050 05/27/23 0724   05/26/23 1000  metroNIDAZOLE (FLAGYL) IVPB 500 mg  Status:  Discontinued        500 mg 100 mL/hr over 60 Minutes Intravenous Every 12 hours 05/26/23 0415 05/28/23 1045   05/26/23 0415  ceFEPIme (MAXIPIME) 2 g in sodium chloride 0.9 % 100 mL IVPB  Status:  Discontinued        2 g 200 mL/hr over 30 Minutes Intravenous  Once 05/26/23 0411 05/26/23 0414   05/26/23 0415  metroNIDAZOLE (FLAGYL) IVPB 500 mg  Status:  Discontinued        500 mg 100 mL/hr over 60 Minutes Intravenous Every 12 hours 05/26/23 0411 05/26/23 0415   05/26/23 0415  vancomycin (VANCOCIN) IVPB 1000 mg/200 mL premix  Status:  Discontinued        1,000 mg 200 mL/hr over 60 Minutes Intravenous  Once 05/26/23 0411 05/26/23 0415   05/26/23 0400  ceFEPIme (MAXIPIME) 2 g in sodium chloride 0.9 % 100 mL IVPB  Status:  Discontinued        2 g 200 mL/hr over 30 Minutes Intravenous Every 8 hours 05/26/23 0050 05/28/23 1045   05/25/23 2100  ceFEPIme (MAXIPIME) 2 g in sodium chloride 0.9 % 100 mL IVPB        2  g 200 mL/hr over 30 Minutes Intravenous  Once 05/25/23 2049 05/25/23 2137   05/25/23 2100  metroNIDAZOLE (FLAGYL) IVPB 500 mg        500 mg 100 mL/hr over 60 Minutes Intravenous  Once 05/25/23 2049 05/25/23 2312   05/25/23 2100  vancomycin (VANCOCIN) IVPB 1000 mg/200 mL premix        1,000 mg 200 mL/hr over 60 Minutes Intravenous  Once 05/25/23 2049 05/25/23 2207       Subjective: Patient seen and examined at bedside today.  He looks much better today.  He was in normal sinus rhythm.  Blood pressure stable.  Lying on bed.  Still weak but feels much better.  Alert and oriented.  Family at bedside  Objective: Vitals:   05/31/23 0700 05/31/23 0800 05/31/23 0807 05/31/23 1000  BP:  (!) 132/54  (!) 126/50  Pulse: 84 81  90  Resp: (!) 21 18  (!)  28  Temp:   98.4 F (36.9 C)   TempSrc:   Axillary   SpO2: 96% 97% 99% 92%  Weight:      Height:        Intake/Output Summary (Last 24 hours) at 05/31/2023 1157 Last data filed at 05/31/2023 1100 Gross per 24 hour  Intake 91.81 ml  Output 1015 ml  Net -923.19 ml   Filed Weights   05/26/23 0000 05/27/23 1156  Weight: 93.2 kg 97.6 kg    Examination:  General exam: Overall comfortable, not in distress, weak, deconditioned HEENT: PERRL Respiratory system:  no wheezes or crackles, diminished air sounds on bases Cardiovascular system: S1 & S2 heard, RRR.  Gastrointestinal system: Abdomen is nondistended, soft and nontender.  Right upper quadrant drain Central nervous system: Alert and oriented Extremities: No edema, no clubbing ,no cyanosis Skin: No rashes, no ulcers,no icterus     Data Reviewed: I have personally reviewed following labs and imaging studies  CBC: Recent Labs  Lab 05/25/23 2042 05/26/23 0500 05/27/23 0512 05/28/23 0330 05/29/23 0306 05/30/23 0733 05/31/23 0629  WBC 16.3*   < > 12.3* 11.0* 10.5 10.5 11.1*  NEUTROABS 14.4*  --   --   --   --   --   --   HGB 12.3*   < > 10.6* 11.7* 11.0* 11.1* 11.6*  HCT  37.1*   < > 32.9* 35.8* 35.6* 34.6* 37.2*  MCV 90.3   < > 90.9 92.7 93.7 90.6 91.9  PLT 269   < > 176 189 219 273 350   < > = values in this interval not displayed.   Basic Metabolic Panel: Recent Labs  Lab 05/27/23 0512 05/27/23 1057 05/28/23 0330 05/29/23 0306 05/30/23 0733 05/31/23 0629  NA 128*  --  129* 131* 133* 133*  K 4.0  --  4.2 4.3 3.9 4.0  CL 98  --  100 101 103 100  CO2 21*  --  19* 20* 22 23  GLUCOSE 148*  --  149* 177* 207* 203*  BUN 12  --  17 19 17 20   CREATININE 0.68  --  0.75 0.78 0.62 0.59*  CALCIUM 8.2*  --  7.9* 7.4* 7.6* 8.2*  MG  --  1.3*  --   --   --  2.0     Recent Results (from the past 240 hours)  Culture, blood (Routine x 2)     Status: Abnormal   Collection Time: 05/25/23  8:43 PM   Specimen: BLOOD  Result Value Ref Range Status   Specimen Description   Final    BLOOD BLOOD LEFT ARM Performed at [facility], [address] [place], [place] [address]    Special Requests   Final    Blood Culture results may not be optimal due to an inadequate volume of blood received in culture bottles BOTTLES DRAWN AEROBIC AND ANAEROBIC Performed at [facility], [address] [place], [place] [address]    Culture  Setup Time   Final    GRAM POSITIVE COCCI ANAEROBIC BOTTLE ONLY CRITICAL RESULT CALLED TO, READ BACK BY AND VERIFIED WITH: PHARMD JUSTIN LEGGE ON 05/27/23 @ 1842 BY DRT    Culture (A)  Final    ROTHIA MUCILAGINOSA Standardized susceptibility testing for this organism is not available. Performed at [facility], [address], [place], [place] [address]    Report Status 05/28/2023 FINAL  Final  Blood Culture ID Panel (Reflexed)  Status: None   Collection Time: 05/25/23  8:43 PM  Result Value Ref Range Status   Enterococcus faecalis NOT DETECTED NOT DETECTED Final   Enterococcus Faecium NOT DETECTED NOT DETECTED Final   Listeria monocytogenes NOT DETECTED NOT DETECTED Final    Staphylococcus species NOT DETECTED NOT DETECTED Final   Staphylococcus aureus (BCID) NOT DETECTED NOT DETECTED Final   Staphylococcus epidermidis NOT DETECTED NOT DETECTED Final   Staphylococcus lugdunensis NOT DETECTED NOT DETECTED Final   Streptococcus species NOT DETECTED NOT DETECTED Final   Streptococcus agalactiae NOT DETECTED NOT DETECTED Final   Streptococcus pneumoniae NOT DETECTED NOT DETECTED Final   Streptococcus pyogenes NOT DETECTED NOT DETECTED Final   A.calcoaceticus-baumannii NOT DETECTED NOT DETECTED Final   Bacteroides fragilis NOT DETECTED NOT DETECTED Final   Enterobacterales NOT DETECTED NOT DETECTED Final   Enterobacter cloacae complex NOT DETECTED NOT DETECTED Final   Escherichia coli NOT DETECTED NOT DETECTED Final   Klebsiella aerogenes NOT DETECTED NOT DETECTED Final   Klebsiella oxytoca NOT DETECTED NOT DETECTED Final   Klebsiella pneumoniae NOT DETECTED NOT DETECTED Final   Proteus species NOT DETECTED NOT DETECTED Final   Salmonella species NOT DETECTED NOT DETECTED Final   Serratia marcescens NOT DETECTED NOT DETECTED Final   Haemophilus influenzae NOT DETECTED NOT DETECTED Final   Neisseria meningitidis NOT DETECTED NOT DETECTED Final   Pseudomonas aeruginosa NOT DETECTED NOT DETECTED Final   Stenotrophomonas maltophilia NOT DETECTED NOT DETECTED Final   Candida albicans NOT DETECTED NOT DETECTED Final   Candida auris NOT DETECTED NOT DETECTED Final   Candida glabrata NOT DETECTED NOT DETECTED Final   Candida krusei NOT DETECTED NOT DETECTED Final   Candida parapsilosis NOT DETECTED NOT DETECTED Final   Candida tropicalis NOT DETECTED NOT DETECTED Final   Cryptococcus neoformans/gattii NOT DETECTED NOT DETECTED Final    Comment: Performed at [facility], [address], [place], [place] [address]  Culture, blood (Routine x 2)     Status: None   Collection Time: 05/25/23  8:45 PM   Specimen: BLOOD  Result Value Ref Range Status   Specimen  Description   Final    BLOOD BLOOD RIGHT FOREARM Performed at [facility], [address], [place], [place] [address]    Special Requests   Final    Blood Culture results may not be optimal due to an inadequate volume of blood received in culture bottles BOTTLES DRAWN AEROBIC AND ANAEROBIC Performed at [facility], [address] [place], [place] [address]    Culture   Final    NO GROWTH 5 DAYS Performed at [facility], [address], [place], [place] [address]    Report Status 05/31/2023 FINAL  Final  Resp panel by RT-PCR (RSV, Flu A&B, Covid)     Status: None   Collection Time: 05/25/23  9:05 PM  Result Value Ref Range Status   SARS Coronavirus 2 by RT PCR NEGATIVE NEGATIVE Final    Comment: (NOTE) SARS-CoV-2 target nucleic acids are NOT DETECTED.  The SARS-CoV-2 RNA is generally detectable in upper respiratory specimens during the acute phase of infection. The lowest concentration of SARS-CoV-2 viral copies this assay can detect is 138 copies/mL. A negative result does not preclude SARS-Cov-2 infection and should not be used as the sole basis for treatment or other patient management decisions. A negative result may occur with  improper specimen collection/handling, submission of specimen other than nasopharyngeal swab, presence of viral mutation(s)  within the areas targeted by this assay, and inadequate number of viral copies(<138 copies/mL). A negative result must be combined with clinical observations, patient history, and epidemiological information. The expected result is Negative.  Fact Sheet for Patients:  BloggerCourse.com  Fact Sheet for Healthcare Providers:  SeriousBroker.it  This test is no t yet approved or cleared by the [place] FDA and  has been authorized for detection and/or diagnosis of SARS-CoV-2 by FDA under an Emergency Use Authorization (EUA). This  EUA will remain  in effect (meaning this test can be used) for the duration of the COVID-19 declaration under Section 564(b)(1) of the Act, 21 U.S.C.section 360bbb-3(b)(1), unless the authorization is terminated  or revoked sooner.       Influenza A by PCR NEGATIVE NEGATIVE Final   Influenza B by PCR NEGATIVE NEGATIVE Final    Comment: (NOTE) The Xpert Xpress SARS-CoV-2/FLU/RSV plus assay is intended as an aid in the diagnosis of influenza from Nasopharyngeal swab specimens and should not be used as a sole basis for treatment. Nasal washings and aspirates are unacceptable for Xpert Xpress SARS-CoV-2/FLU/RSV testing.  Fact Sheet for Patients: BloggerCourse.com  Fact Sheet for Healthcare Providers: SeriousBroker.it  This test is not yet approved or cleared by the [place] FDA and has been authorized for detection and/or diagnosis of SARS-CoV-2 by FDA under an Emergency Use Authorization (EUA). This EUA will remain in effect (meaning this test can be used) for the duration of the COVID-19 declaration under Section 564(b)(1) of the Act, 21 U.S.C. section 360bbb-3(b)(1), unless the authorization is terminated or revoked.     Resp Syncytial Virus by PCR NEGATIVE NEGATIVE Final    Comment: (NOTE) Fact Sheet for Patients: BloggerCourse.com  Fact Sheet for Healthcare Providers: SeriousBroker.it  This test is not yet approved or cleared by the [place] FDA and has been authorized for detection and/or diagnosis of SARS-CoV-2 by FDA under an Emergency Use Authorization (EUA). This EUA will remain in effect (meaning this test can be used) for the duration of the COVID-19 declaration under Section 564(b)(1) of the Act, 21 U.S.C. section 360bbb-3(b)(1), unless the authorization is terminated or revoked.  Performed at [facility], [address] [place], [place] [address]   MRSA Next Gen by PCR, Nasal     Status: None   Collection Time: 05/27/23 12:13 PM   Specimen: Nasal Mucosa; Nasal Swab  Result Value Ref Range Status   MRSA by PCR Next Gen NOT DETECTED NOT DETECTED Final    Comment: (NOTE) The GeneXpert MRSA Assay (FDA approved for NASAL specimens only), is one component of a comprehensive MRSA colonization surveillance program. It is not intended to diagnose MRSA infection nor to guide or monitor treatment for MRSA infections. Test performance is not FDA approved in patients less than 45 years old. Performed at [facility], [address] [place], [place] [address]   Aerobic/Anaerobic Culture w Gram Stain (surgical/deep wound)     Status: None (Preliminary result)   Collection Time: 05/28/23 12:47 PM   Specimen: BILE  Result Value Ref Range Status   Specimen Description   Final    BILE Performed at [facility], [address] [place], [place] [address]    Special Requests   Final    NONE Performed at [facility], [address], [place], [place] [address]    Gram Stain   Final    NO WBC SEEN FEW GRAM NEGATIVE RODS Performed at  [facility], [address], [place], [place] [address]    Culture   Final    MODERATE ESCHERICHIA COLI Confirmed Extended Spectrum Beta-Lactamase Producer (ESBL).  In bloodstream infections from ESBL organisms, carbapenems are preferred over piperacillin/tazobactam. They are shown to have a lower risk of mortality. NO ANAEROBES ISOLATED; CULTURE IN PROGRESS FOR 5 DAYS    Report Status PENDING  Incomplete   Organism ID, Bacteria ESCHERICHIA COLI  Final      Susceptibility   Escherichia coli - MIC*    AMPICILLIN >=32 RESISTANT Resistant     CEFEPIME <=0.12 SENSITIVE Sensitive     CEFTAZIDIME RESISTANT Resistant     CEFTRIAXONE 1 SENSITIVE Sensitive     CIPROFLOXACIN <=0.25 SENSITIVE Sensitive     GENTAMICIN <=1  SENSITIVE Sensitive     IMIPENEM <=0.25 SENSITIVE Sensitive     TRIMETH/SULFA <=20 SENSITIVE Sensitive     AMPICILLIN/SULBACTAM >=32 RESISTANT Resistant     PIP/TAZO 64 INTERMEDIATE Intermediate ug/mL    * MODERATE ESCHERICHIA COLI     Radiology Studies: No results found.   Scheduled Meds:  apixaban  5 mg Oral BID   Chlorhexidine Gluconate Cloth  6 each Topical Daily   ciprofloxacin  500 mg Oral BID   diltiazem  60 mg Oral Q8H   feeding supplement (GLUCERNA SHAKE)  237 mL Oral TID BM   insulin aspart  0-15 Units Subcutaneous TID WC   insulin aspart  0-5 Units Subcutaneous QHS   insulin glargine-yfgn  10 Units Subcutaneous Daily   ipratropium-albuterol  3 mL Nebulization BID   melatonin  5 mg Oral QHS   metroNIDAZOLE  500 mg Oral Q12H   polyethylene glycol  17 g Oral Daily   senna-docusate  1 tablet Oral BID   sodium chloride flush  5 mL Intracatheter Q8H   tamsulosin  0.4 mg Oral Daily   Continuous Infusions:     LOS: 5 days   Burnadette Pop, MD [facility] P2/14/2025, 11:57 AM

## 2023-06-01 DIAGNOSIS — A419 Sepsis, unspecified organism: Secondary | ICD-10-CM | POA: Diagnosis not present

## 2023-06-01 LAB — CBC
HCT: 35.5 % — ABNORMAL LOW (ref 39.0–52.0)
Hemoglobin: 11.2 g/dL — ABNORMAL LOW (ref 13.0–17.0)
MCH: 29.3 pg (ref 26.0–34.0)
MCHC: 31.5 g/dL (ref 30.0–36.0)
MCV: 92.9 fL (ref 80.0–100.0)
Platelets: 374 10*3/uL (ref 150–400)
RBC: 3.82 MIL/uL — ABNORMAL LOW (ref 4.22–5.81)
RDW: 14.5 % (ref 11.5–15.5)
WBC: 11.7 10*3/uL — ABNORMAL HIGH (ref 4.0–10.5)
nRBC: 0 % (ref 0.0–0.2)

## 2023-06-01 LAB — GLUCOSE, CAPILLARY
Glucose-Capillary: 137 mg/dL — ABNORMAL HIGH (ref 70–99)
Glucose-Capillary: 169 mg/dL — ABNORMAL HIGH (ref 70–99)
Glucose-Capillary: 172 mg/dL — ABNORMAL HIGH (ref 70–99)
Glucose-Capillary: 144 mg/dL — ABNORMAL HIGH (ref 70–99)

## 2023-06-01 LAB — BASIC METABOLIC PANEL
Anion gap: 7 (ref 5–15)
BUN: 16 mg/dL (ref 8–23)
CO2: 25 mmol/L (ref 22–32)
Calcium: 8 mg/dL — ABNORMAL LOW (ref 8.9–10.3)
Chloride: 102 mmol/L (ref 98–111)
Creatinine, Ser: 0.49 mg/dL — ABNORMAL LOW (ref 0.61–1.24)
GFR, Estimated: >60 mL/min (ref >60)
Glucose, Bld: 170 mg/dL — ABNORMAL HIGH (ref 70–99)
Potassium: 4.2 mmol/L (ref 3.5–5.1)
Sodium: 134 mmol/L — ABNORMAL LOW (ref 135–145)

## 2023-06-01 MED ORDER — IPRATROPIUM-ALBUTEROL 0.5-2.5 (3) MG/3ML IN SOLN: 3.0000 mL | Freq: Four times a day (QID) | RESPIRATORY_TRACT | Status: DC | PRN

## 2023-06-01 NOTE — Plan of Care (Signed)
  Problem: Fluid Volume: Goal: Ability to maintain a balanced intake and output will improve Outcome: Progressing   Problem: Metabolic: Goal: Ability to maintain appropriate glucose levels will improve Outcome: Progressing   Problem: Skin Integrity: Goal: Risk for impaired skin integrity will decrease Outcome: Progressing   Problem: Tissue Perfusion: Goal: Adequacy of tissue perfusion will improve Outcome: Progressing   Problem: Clinical Measurements: Goal: Signs and symptoms of infection will decrease Outcome: Progressing

## 2023-06-01 NOTE — Progress Notes (Signed)
 Patient reported to nursing student that when he woke up from nap he felt "blah". Upon assessment, patient was leaning to left side in bed. Patient repositioned. Given IV Zofran due to patient's complaint of gastric discomfort. Patient stated, "I think I just ate too much". Denies feeling abdominal gas, cramping, or pain. Ice chips given to assist with gastric discomfort. Bed in lowest position. Bed alarm activated and functional. Call bell within reach. Will continue to monitor.

## 2023-06-01 NOTE — Progress Notes (Signed)
 PROGRESS NOTE  Jonathan Owen  WUJ:[id number] DOB: [birth year] DOA: 05/25/2023 PCP: Noni Saupe, MD   Brief Narrative: Patient is a 89 male with history of insulin-dependent diabetes type 2, hypertension, colon cancer, osteoarthritis, prior PE who presented with shortness of breath, fever, chills, weakness from home. He felt like being smothered.   On presentation, he was tachycardiac, febrile.  Suspected to have sepsis of unclear etiology.  UA was not suspicoius  UTI.  Chest x-ray did not show any pneumonia.  Also went to A-fib with RVR in the emergency department.  Ultrasound of the abdomen/CT abdomen/pelvis showed acute cholecystitis. HIDA positive. General surgery, cardiology consulted. S/P IR guided percutaneous cholecystostomy drain on 2/11.  Clinically improving since then.  PT recommending SNF on discharge. Placement is pending.   Assessment & Plan:  Principal Problem:   Sepsis (HCC) Active Problems:   HTN (hypertension)   Type 2 diabetes mellitus without complication (HCC)   Hyponatremia   Atrial fibrillation with RVR (HCC)   PAF (paroxysmal atrial fibrillation) (HCC)   Medication management   Sepsis: Presented with fever, leukocytosis, tachycardia.  He did not complain of severe abdominal pain on admission, right upper quadrant is mostly nontender. Blood  anaerobic bottle culture sets showed gram-positive cocci, most likely contamination.  Aerobic/anaerobic culture showed ESBL E. coli.  Antibiotics changed to  ciprofloxacin and flagyl  as per ID pharmacy.plan to continue these antibiotics for 2 weeks course .currently blood pressure stable.   Tx Cholecystitis as below  Acute cholecystitis: Presented with fever.  Liver enzymes normal.  CT abdomen/pelvis, right upper quadrant ultrasound suggestive of acute cholecystitis.  HIDA came out to be positive.  General surgery consulted.General Surgery recommended IR guided drain placement.  S/P  percutaneous cholecystostomy drain  placement on 2/11.   He will continue with drain on discharge and follow-up with  IR as an outpatient Continue cipro/flagyl  New onset A-fib with RVR: was on Cardizem drip.  Cardiology consulted and was following.    Anticoagulation changed to Eliquis.   on diltiazem 180 mg daily.   Cardiology will follow him as an outpatient and plan for DCCV after at least 3 weeks of anticoagulation.  Insulin-dependent diabetes type 2: Recent A1c of 6.1.  Takes insulin at home.   Continue sliding scale, long-acting insulin.  Monitor blood sugars  Hyponatremia: Improved Monitor BMP  Hypertension: Currently BP stable. Titrate meds as necessary  Weakness: Patient lives alone.  Ambulatory at baseline.   PT recommended SNF on discharge this is pending   Confusion/delirum improved : Alert and oriented at baseline.  This is most likely hospital-acquired delirium.  Minimize sedatives, narcotics continue frequent reorientation, delirium precautions.  This morning he was alert and oriented. Monitor  Scrotal maceration  Keep barrier cream as needed Scrotum elevation Keep dry - condom cath ok for now   Goals of care: Goals of care discussed by previous hospitalist with daughter [name].  She is expecting full recovery.  Patient is usually fully independent and healthy.   Patient remains full code.   Palliative care also consulted   Pressure Injury 05/27/23 Sacrum Medial Stage 1 -  Intact skin with non-blanchable redness of a localized area usually over a bony prominence. (Active)  05/27/23 1200  Location: Sacrum  Location Orientation: Medial  Staging: Stage 1 -  Intact skin with non-blanchable redness of a localized area usually over a bony prominence.  Wound Description (Comments):   Present on Admission: Yes  Dressing Type Foam - Lift  dressing to assess site every shift 05/31/23 1645    DVT prophylaxis:iv heparin apixaban (ELIQUIS) tablet 5 mg     Code Status: Full Code  Family Communication:  family at bedside on rounds all questions answered   Patient status: Inpatient  Patient is from : Home  Anticipated discharge to: SNF  Estimated DC date: likely on Monday   Consultants: General surgery, cardiology,IR  Procedures: Right upper quadrant drain placement  Antimicrobials:  Anti-infectives (From admission, onward)    Start     Dose/Rate Route Frequency Ordered Stop   05/30/23 1045  ciprofloxacin (CIPRO) tablet 500 mg        500 mg Oral 2 times daily 05/30/23 0957 06/10/23 2359   05/30/23 1045  metroNIDAZOLE (FLAGYL) tablet 500 mg        500 mg Oral Every 12 hours 05/30/23 0957 06/10/23 2359   05/28/23 1200  piperacillin-tazobactam (ZOSYN) IVPB 3.375 g  Status:  Discontinued        3.375 g 12.5 mL/hr over 240 Minutes Intravenous Every 8 hours 05/28/23 1051 05/30/23 0957   05/27/23 2200  vancomycin (VANCOCIN) IVPB 1000 mg/200 mL premix  Status:  Discontinued        1,000 mg 200 mL/hr over 60 Minutes Intravenous Every 24 hours 05/27/23 0724 05/27/23 1322   05/26/23 2200  vancomycin (VANCOREADY) IVPB 1750 mg/350 mL  Status:  Discontinued        1,750 mg 175 mL/hr over 120 Minutes Intravenous Every 24 hours 05/26/23 0050 05/27/23 0724   05/26/23 1000  metroNIDAZOLE (FLAGYL) IVPB 500 mg  Status:  Discontinued        500 mg 100 mL/hr over 60 Minutes Intravenous Every 12 hours 05/26/23 0415 05/28/23 1045   05/26/23 0415  ceFEPIme (MAXIPIME) 2 g in sodium chloride 0.9 % 100 mL IVPB  Status:  Discontinued        2 g 200 mL/hr over 30 Minutes Intravenous  Once 05/26/23 0411 05/26/23 0414   05/26/23 0415  metroNIDAZOLE (FLAGYL) IVPB 500 mg  Status:  Discontinued        500 mg 100 mL/hr over 60 Minutes Intravenous Every 12 hours 05/26/23 0411 05/26/23 0415   05/26/23 0415  vancomycin (VANCOCIN) IVPB 1000 mg/200 mL premix  Status:  Discontinued        1,000 mg 200 mL/hr over 60 Minutes Intravenous  Once 05/26/23 0411 05/26/23 0415   05/26/23 0400  ceFEPIme (MAXIPIME) 2 g in  sodium chloride 0.9 % 100 mL IVPB  Status:  Discontinued        2 g 200 mL/hr over 30 Minutes Intravenous Every 8 hours 05/26/23 0050 05/28/23 1045   05/25/23 2100  ceFEPIme (MAXIPIME) 2 g in sodium chloride 0.9 % 100 mL IVPB        2 g 200 mL/hr over 30 Minutes Intravenous  Once 05/25/23 2049 05/25/23 2137   05/25/23 2100  metroNIDAZOLE (FLAGYL) IVPB 500 mg        500 mg 100 mL/hr over 60 Minutes Intravenous  Once 05/25/23 2049 05/25/23 2312   05/25/23 2100  vancomycin (VANCOCIN) IVPB 1000 mg/200 mL premix        1,000 mg 200 mL/hr over 60 Minutes Intravenous  Once 05/25/23 2049 05/25/23 2207       Subjective: Patient seen and examined at bedside today.  He looks much better today.  He was in normal sinus rhythm.  Blood pressure stable.  Lying on bed.  Still weak but feels much better.  Alert and oriented.  Family at bedside  Objective: Vitals:   05/31/23 1637 05/31/23 2019 06/01/23 0017 06/01/23 0524  BP: (!) 141/61 (!) 143/65 128/73 130/66  Pulse: 92 (!) 105 86 90  Resp: (!) 22 (!) 24 20 16   Temp: 98.5 F (36.9 C) 99.4 F (37.4 C) 98 F (36.7 C) 98.6 F (37 C)  TempSrc: Oral Oral Oral Oral  SpO2: 96% 95% 96% 95%  Weight:      Height:        Intake/Output Summary (Last 24 hours) at 06/01/2023 0753 Last data filed at 06/01/2023 0530 Gross per 24 hour  Intake 10 ml  Output 1115 ml  Net -1105 ml   Filed Weights   05/26/23 0000 05/27/23 1156  Weight: 93.2 kg 97.6 kg    Examination:  General exam: Overall comfortable, not in distress, Respiratory system:  no wheezes or crackles, diminished air sounds on bases Cardiovascular system: S1 & S2 heard, RRR.  Gastrointestinal system: Abdomen is nondistended, soft and nontender.  Right upper quadrant drain Central nervous system: Alert and oriented Extremities: No edema, no clubbing ,no cyanosis Skin: scrotal rash /maceration mild   Data Reviewed: I have personally reviewed following labs and imaging  studies  CBC: Recent Labs  Lab 05/25/23 2042 05/26/23 0500 05/28/23 0330 05/29/23 0306 05/30/23 0733 05/31/23 0629 06/01/23 0406  WBC 16.3*   < > 11.0* 10.5 10.5 11.1* 11.7*  NEUTROABS 14.4*  --   --   --   --   --   --   HGB 12.3*   < > 11.7* 11.0* 11.1* 11.6* 11.2*  HCT 37.1*   < > 35.8* 35.6* 34.6* 37.2* 35.5*  MCV 90.3   < > 92.7 93.7 90.6 91.9 92.9  PLT 269   < > 189 219 273 350 374   < > = values in this interval not displayed.   Basic Metabolic Panel: Recent Labs  Lab 05/27/23 1057 05/28/23 0330 05/29/23 0306 05/30/23 0733 05/31/23 0629 06/01/23 0406  NA  --  129* 131* 133* 133* 134*  K  --  4.2 4.3 3.9 4.0 4.2  CL  --  100 101 103 100 102  CO2  --  19* 20* 22 23 25   GLUCOSE  --  149* 177* 207* 203* 170*  BUN  --  17 19 17 20 16   CREATININE  --  0.75 0.78 0.62 0.59* 0.49*  CALCIUM  --  7.9* 7.4* 7.6* 8.2* 8.0*  MG 1.3*  --   --   --  2.0  --      Recent Results (from the past 240 hours)  Culture, blood (Routine x 2)     Status: Abnormal   Collection Time: 05/25/23  8:43 PM   Specimen: BLOOD  Result Value Ref Range Status   Specimen Description   Final    BLOOD BLOOD LEFT ARM Performed at [facility], [address], [place], [place] [address]    Special Requests   Final    Blood Culture results may not be optimal due to an inadequate volume of blood received in culture bottles BOTTLES DRAWN AEROBIC AND ANAEROBIC Performed at [facility], [address], [place], [place] [address]    Culture  Setup Time   Final    GRAM POSITIVE COCCI ANAEROBIC BOTTLE ONLY CRITICAL RESULT CALLED TO, READ BACK BY AND VERIFIED WITH: PHARMD JUSTIN LEGGE ON 05/27/23 @ 1842 BY DRT    Culture (A)  Final  ROTHIA MUCILAGINOSA Standardized susceptibility testing for this organism is not available. Performed at [facility], [address] [place], [place] [address]    Report Status 05/28/2023 FINAL  Final  Blood Culture ID  Panel (Reflexed)     Status: None   Collection Time: 05/25/23  8:43 PM  Result Value Ref Range Status   Enterococcus faecalis NOT DETECTED NOT DETECTED Final   Enterococcus Faecium NOT DETECTED NOT DETECTED Final   Listeria monocytogenes NOT DETECTED NOT DETECTED Final   Staphylococcus species NOT DETECTED NOT DETECTED Final   Staphylococcus aureus (BCID) NOT DETECTED NOT DETECTED Final   Staphylococcus epidermidis NOT DETECTED NOT DETECTED Final   Staphylococcus lugdunensis NOT DETECTED NOT DETECTED Final   Streptococcus species NOT DETECTED NOT DETECTED Final   Streptococcus agalactiae NOT DETECTED NOT DETECTED Final   Streptococcus pneumoniae NOT DETECTED NOT DETECTED Final   Streptococcus pyogenes NOT DETECTED NOT DETECTED Final   A.calcoaceticus-baumannii NOT DETECTED NOT DETECTED Final   Bacteroides fragilis NOT DETECTED NOT DETECTED Final   Enterobacterales NOT DETECTED NOT DETECTED Final   Enterobacter cloacae complex NOT DETECTED NOT DETECTED Final   Escherichia coli NOT DETECTED NOT DETECTED Final   Klebsiella aerogenes NOT DETECTED NOT DETECTED Final   Klebsiella oxytoca NOT DETECTED NOT DETECTED Final   Klebsiella pneumoniae NOT DETECTED NOT DETECTED Final   Proteus species NOT DETECTED NOT DETECTED Final   Salmonella species NOT DETECTED NOT DETECTED Final   Serratia marcescens NOT DETECTED NOT DETECTED Final   Haemophilus influenzae NOT DETECTED NOT DETECTED Final   Neisseria meningitidis NOT DETECTED NOT DETECTED Final   Pseudomonas aeruginosa NOT DETECTED NOT DETECTED Final   Stenotrophomonas maltophilia NOT DETECTED NOT DETECTED Final   Candida albicans NOT DETECTED NOT DETECTED Final   Candida auris NOT DETECTED NOT DETECTED Final   Candida glabrata NOT DETECTED NOT DETECTED Final   Candida krusei NOT DETECTED NOT DETECTED Final   Candida parapsilosis NOT DETECTED NOT DETECTED Final   Candida tropicalis NOT DETECTED NOT DETECTED Final   Cryptococcus  neoformans/gattii NOT DETECTED NOT DETECTED Final    Comment: Performed at [facility], [address], [place], [place] [address]  Culture, blood (Routine x 2)     Status: None   Collection Time: 05/25/23  8:45 PM   Specimen: BLOOD  Result Value Ref Range Status   Specimen Description   Final    BLOOD BLOOD RIGHT FOREARM Performed at [facility], [address] [place], [place] [address]    Special Requests   Final    Blood Culture results may not be optimal due to an inadequate volume of blood received in culture bottles BOTTLES DRAWN AEROBIC AND ANAEROBIC Performed at [facility], [address], [place], [place] [address]    Culture   Final    NO GROWTH 5 DAYS Performed at [facility], [address], [place], [place] [address]    Report Status 05/31/2023 FINAL  Final  Resp panel by RT-PCR (RSV, Flu A&B, Covid)     Status: None   Collection Time: 05/25/23  9:05 PM  Result Value Ref Range Status   SARS Coronavirus 2 by RT PCR NEGATIVE NEGATIVE Final    Comment: (NOTE) SARS-CoV-2 target nucleic acids are NOT DETECTED.  The SARS-CoV-2 RNA is generally detectable in upper respiratory specimens during the acute phase of infection. The lowest concentration of SARS-CoV-2 viral copies this assay can detect is 138 copies/mL. A negative  result does not preclude SARS-Cov-2 infection and should not be used as the sole basis for treatment or other patient management decisions. A negative result may occur with  improper specimen collection/handling, submission of specimen other than nasopharyngeal swab, presence of viral mutation(s) within the areas targeted by this assay, and inadequate number of viral copies(<138 copies/mL). A negative result must be combined with clinical observations, patient history, and epidemiological information. The expected result is Negative.  Fact Sheet for Patients:   BloggerCourse.com  Fact Sheet for Healthcare Providers:  SeriousBroker.it  This test is no t yet approved or cleared by the [place] FDA and  has been authorized for detection and/or diagnosis of SARS-CoV-2 by FDA under an Emergency Use Authorization (EUA). This EUA will remain  in effect (meaning this test can be used) for the duration of the COVID-19 declaration under Section 564(b)(1) of the Act, 21 U.S.C.section 360bbb-3(b)(1), unless the authorization is terminated  or revoked sooner.       Influenza A by PCR NEGATIVE NEGATIVE Final   Influenza B by PCR NEGATIVE NEGATIVE Final    Comment: (NOTE) The Xpert Xpress SARS-CoV-2/FLU/RSV plus assay is intended as an aid in the diagnosis of influenza from Nasopharyngeal swab specimens and should not be used as a sole basis for treatment. Nasal washings and aspirates are unacceptable for Xpert Xpress SARS-CoV-2/FLU/RSV testing.  Fact Sheet for Patients: BloggerCourse.com  Fact Sheet for Healthcare Providers: SeriousBroker.it  This test is not yet approved or cleared by the [place] FDA and has been authorized for detection and/or diagnosis of SARS-CoV-2 by FDA under an Emergency Use Authorization (EUA). This EUA will remain in effect (meaning this test can be used) for the duration of the COVID-19 declaration under Section 564(b)(1) of the Act, 21 U.S.C. section 360bbb-3(b)(1), unless the authorization is terminated or revoked.     Resp Syncytial Virus by PCR NEGATIVE NEGATIVE Final    Comment: (NOTE) Fact Sheet for Patients: BloggerCourse.com  Fact Sheet for Healthcare Providers: SeriousBroker.it  This test is not yet approved or cleared by the [place] FDA and has been authorized for detection and/or diagnosis of SARS-CoV-2 by FDA under an Emergency Use  Authorization (EUA). This EUA will remain in effect (meaning this test can be used) for the duration of the COVID-19 declaration under Section 564(b)(1) of the Act, 21 U.S.C. section 360bbb-3(b)(1), unless the authorization is terminated or revoked.  Performed at [facility], [address], [place], [place] [address]   MRSA Next Gen by PCR, Nasal     Status: None   Collection Time: 05/27/23 12:13 PM   Specimen: Nasal Mucosa; Nasal Swab  Result Value Ref Range Status   MRSA by PCR Next Gen NOT DETECTED NOT DETECTED Final    Comment: (NOTE) The GeneXpert MRSA Assay (FDA approved for NASAL specimens only), is one component of a comprehensive MRSA colonization surveillance program. It is not intended to diagnose MRSA infection nor to guide or monitor treatment for MRSA infections. Test performance is not FDA approved in patients less than 54 years old. Performed at [facility], [address] [place], [place] [address]   Aerobic/Anaerobic Culture w Gram Stain (surgical/deep wound)     Status: None (Preliminary result)   Collection Time: 05/28/23 12:47 PM   Specimen: BILE  Result Value Ref Range Status   Specimen Description   Final    BILE Performed at [facility], [address] [place], [place] [address]  Special Requests   Final    NONE Performed at [facility], [address] [place], [place] [address]    Gram Stain   Final    NO WBC SEEN FEW GRAM NEGATIVE RODS Performed at [facility], [address], [place], [place] [address]    Culture   Final    MODERATE ESCHERICHIA COLI Confirmed Extended Spectrum Beta-Lactamase Producer (ESBL).  In bloodstream infections from ESBL organisms, carbapenems are preferred over piperacillin/tazobactam. They are shown to have a lower risk of mortality. NO ANAEROBES ISOLATED; CULTURE IN PROGRESS FOR 5 DAYS    Report Status PENDING  Incomplete    Organism ID, Bacteria ESCHERICHIA COLI  Final      Susceptibility   Escherichia coli - MIC*    AMPICILLIN >=32 RESISTANT Resistant     CEFEPIME <=0.12 SENSITIVE Sensitive     CEFTAZIDIME RESISTANT Resistant     CEFTRIAXONE 1 SENSITIVE Sensitive     CIPROFLOXACIN <=0.25 SENSITIVE Sensitive     GENTAMICIN <=1 SENSITIVE Sensitive     IMIPENEM <=0.25 SENSITIVE Sensitive     TRIMETH/SULFA <=20 SENSITIVE Sensitive     AMPICILLIN/SULBACTAM >=32 RESISTANT Resistant     PIP/TAZO 64 INTERMEDIATE Intermediate ug/mL    * MODERATE ESCHERICHIA COLI     Radiology Studies: No results found.   Scheduled Meds:  apixaban  5 mg Oral BID   ciprofloxacin  500 mg Oral BID   diltiazem  180 mg Oral Daily   feeding supplement (GLUCERNA SHAKE)  237 mL Oral TID BM   insulin aspart  0-15 Units Subcutaneous TID WC   insulin aspart  0-5 Units Subcutaneous QHS   insulin glargine-yfgn  10 Units Subcutaneous Daily   ipratropium-albuterol  3 mL Nebulization BID   melatonin  5 mg Oral QHS   metroNIDAZOLE  500 mg Oral Q12H   polyethylene glycol  17 g Oral Daily   senna-docusate  1 tablet Oral BID   sodium chloride flush  5 mL Intracatheter Q8H   tamsulosin  0.4 mg Oral Daily   Continuous Infusions:     LOS: 6 days   Sunnie Nielsen, MD [facility] P2/15/2025, 7:53 AM

## 2023-06-01 NOTE — Progress Notes (Signed)
 Patient still verbalizes he feels "blah". Vital signs obtained and recorded. Patient repositioned. Offered hydration. Patient declined. Patient denies pain at this time. Call bell within reach. Bed in lowest position. Bed alarm on and functional. Will continue to monitor.

## 2023-06-01 NOTE — Progress Notes (Signed)
 Physical Therapy Treatment Patient Details Name: Jonathan Owen MRN: [id number] DOB: [birth year] Today's Date: 06/01/2023   History of Present Illness 89 yo  male  who presented with SOB, fever, chills, & weakness. pt was tachycardiac, febrile. Suspected  sepsis of unclear etiology. CT chest negative. pt  went into A-fib with RVR in the ED. US of the abdomen/CT abdomen/pelvis showed acute cholecystitis. HIDA positive. General surgery, cardiology consulted. IR planning for percutaneous cholecystostomy drain 05/28/23.   PMH: insulin-dependent diabetes type 2, hypertension, colon cancer, osteoarthritis, prior PE    PT Comments  The patient is alert, daughter is  present.  The patient  requiring +2 max assistance for mobility  to sitting then to stand and pivot to recliner using the RW. Patient repeating " I am very weak."  Patient will benefit from continued inpatient follow up therapy, <3 hours/day    If plan is discharge home, recommend the following: A lot of help with walking and/or transfers;A lot of help with bathing/dressing/bathroom;Assistance with cooking/housework;Assist for transportation;Help with stairs or ramp for entrance   Can travel by private vehicle     No  Equipment Recommendations  None recommended by PT    Recommendations for Other Services       Precautions / Restrictions Precautions Precautions: Fall Precaution/Restrictions Comments: monitor sats and HR and BP Restrictions Weight Bearing Restrictions Per Provider Order: No     Mobility  Bed Mobility   Bed Mobility: Supine to Sit     Supine to sit: +2 for physical assistance, +2 for safety/equipment, Max assist     General bed mobility comments: assist to progress LEs on/off bed and elevate  trunk; good pt effort to self assist    Transfers Overall transfer level: Needs assistance Equipment used: Rolling walker (2 wheels) Transfers: Sit to/from Stand Sit to Stand: Mod assist, +2 safety/equipment, +2  physical assistance, Max assist           General transfer comment: max  +2 to rise shuffle steps to recliner,  assist to descend to sitting.    Ambulation/Gait                   Stairs             Wheelchair Mobility     Tilt Bed    Modified Rankin (Stroke Patients Only)       Balance Overall balance assessment: Needs assistance Sitting-balance support: Feet supported, Bilateral upper extremity supported Sitting balance-Leahy Scale: Fair     Standing balance support: During functional activity, Reliant on assistive device for balance, Bilateral upper extremity supported Standing balance-Leahy Scale: Poor Standing balance comment: reliant on  device and external assist                            Communication Communication Factors Affecting Communication: Hearing impaired  Cognition Arousal: Alert Behavior During Therapy: Flat affect   PT - Cognitive impairments: Awareness, Initiation, Sequencing                         Following commands: Intact      Cueing Cueing Techniques: Verbal cues, Tactile cues  Exercises      General Comments        Pertinent Vitals/Pain Pain Assessment Pain Assessment: No/denies pain    Home Living  Prior Function            PT Goals (current goals can now be found in the care plan section) Progress towards PT goals: Progressing toward goals    Frequency    Min 1X/week      PT Plan      Co-evaluation              AM-PAC PT "6 Clicks" Mobility   Outcome Measure  Help needed turning from your back to your side while in a flat bed without using bedrails?: A Lot Help needed moving from lying on your back to sitting on the side of a flat bed without using bedrails?: Total Help needed moving to and from a bed to a chair (including a wheelchair)?: Total Help needed standing up from a chair using your arms (e.g., wheelchair or bedside  chair)?: A Lot Help needed to walk in hospital room?: Total Help needed climbing 3-5 steps with a railing? : Total 6 Click Score: 8    End of Session Equipment Utilized During Treatment: Gait belt Activity Tolerance: Patient limited by fatigue;Patient tolerated treatment well Patient left: with family/visitor present;in chair;with chair alarm set Nurse Communication: Mobility status PT Visit Diagnosis: Other abnormalities of gait and mobility (R26.89);Unsteadiness on feet (R26.81);Muscle weakness (generalized) (M62.81)     Time: 2500-3704 PT Time Calculation (min) (ACUTE ONLY): 20 min  Charges:    $Therapeutic Activity: 8-22 mins PT General Charges $$ ACUTE PT VISIT: 1 Visit                     Blanchard Kelch PT Acute Rehabilitation Services Office [phone] Weekend pager-[phone]    Rada Hay 06/01/2023, 1:55 PM

## 2023-06-01 NOTE — Progress Notes (Signed)
   06/01/23 1800  Mobility  Activity Stood at International aid/development worker  Activity Response Tolerated fair  Hygiene  Linen Change Gown changed;Bed pad changed;Pillow case changed   Clinical research associate received inbound call from nursing secretary stating family was concerned about patient being "wet". Upon assessment, patient noted to have saturation on right side of gown and on right side of bed pad. Daughter requested that these linens be changed. Corky Crafts was changed per request. Once patient was standing at bedside, bed pads and pillow cases were changed. Patient was assisted with transfer to bed and repositioned to a position of comfort. Bed in lowest position. Call bell within reach. Offered hydration. Patient declined.

## 2023-06-02 DIAGNOSIS — I4891 Unspecified atrial fibrillation: Secondary | ICD-10-CM | POA: Diagnosis not present

## 2023-06-02 DIAGNOSIS — A419 Sepsis, unspecified organism: Secondary | ICD-10-CM | POA: Diagnosis not present

## 2023-06-02 LAB — GLUCOSE, CAPILLARY
Glucose-Capillary: 158 mg/dL — ABNORMAL HIGH (ref 70–99)
Glucose-Capillary: 172 mg/dL — ABNORMAL HIGH (ref 70–99)
Glucose-Capillary: 177 mg/dL — ABNORMAL HIGH (ref 70–99)
Glucose-Capillary: 200 mg/dL — ABNORMAL HIGH (ref 70–99)

## 2023-06-02 LAB — CREATININE, SERUM
Creatinine, Ser: 0.55 mg/dL — ABNORMAL LOW (ref 0.61–1.24)
GFR, Estimated: >60 mL/min (ref >60)

## 2023-06-02 LAB — URINALYSIS, ROUTINE W REFLEX MICROSCOPIC
Bilirubin Urine: NEGATIVE
Glucose, UA: NEGATIVE mg/dL
Hgb urine dipstick: NEGATIVE
Ketones, ur: NEGATIVE mg/dL
Nitrite: NEGATIVE
Protein, ur: NEGATIVE mg/dL
Specific Gravity, Urine: 1.02 (ref 1.005–1.030)
pH: 5 (ref 5.0–8.0)

## 2023-06-02 LAB — AEROBIC/ANAEROBIC CULTURE W GRAM STAIN (SURGICAL/DEEP WOUND): Gram Stain: NONE SEEN

## 2023-06-02 MED ORDER — ALUM & MAG HYDROXIDE-SIMETH 200-200-20 MG/5ML PO SUSP: 30.0000 mL | ORAL | Status: DC | PRN

## 2023-06-02 MED ORDER — MAGNESIUM CITRATE PO SOLN
1.0000 | Freq: Once | ORAL | Status: AC
  Administered 2023-06-02: 1 via ORAL
  Filled 2023-06-02: qty 296

## 2023-06-02 MED ORDER — CALCIUM CARBONATE ANTACID 500 MG PO CHEW
2.0000 | CHEWABLE_TABLET | Freq: Three times a day (TID) | ORAL | Status: DC | PRN
  Administered 2023-06-02: 400 mg via ORAL
  Filled 2023-06-02: qty 2

## 2023-06-02 NOTE — Hospital Course (Signed)
 Jonathan Owen is a 89 yo male with PMH DMII, HTN, colon cancer, OA, prior PE who presented with SOB, fever, chills, weakness, and abdominal pain.  On presentation, he was tachycardiac, febrile.  Suspected to have sepsis of unclear etiology.  UA was not suspicoius  UTI.  Chest x-ray did not show any pneumonia.  Also went to A-fib with RVR in the emergency department.  Ultrasound of the abdomen/CT abdomen/pelvis showed acute cholecystitis. HIDA positive. General surgery, cardiology consulted. S/P IR guided percutaneous cholecystostomy drain on 2/11.  Clinically improving since then.  PT recommending SNF on discharge. Placement is pending.

## 2023-06-02 NOTE — TOC Initial Note (Addendum)
 Transition of Care [facility]) - Initial/Assessment Note    Patient Details  Name: Jonathan Owen MRN: [id number] Date of Birth: [birth year]  [facility]) CM/SW Contact:    Diona Browner, LCSW Phone Number: 06/02/2023, 3:33 PM  Clinical Narrative:                 CSW spoke w/ pt dtr. Dtr accepted bed offer from [facility]. Insurance auth started.  UPDATE- Insurance auth approved.   Expected Discharge Plan: Skilled Nursing Facility Barriers to Discharge: Continued Medical Work up   Patient Goals and CMS Choice            Expected Discharge Plan and Services In-house Referral: Clinical Social Work   Post Acute Care Choice: Skilled Nursing Facility Living arrangements for the past 2 months: Single Family Home                 DME Arranged: N/A DME Agency: NA                  Prior Living Arrangements/Services Living arrangements for the past 2 months: Single Family Home Lives with:: Self Patient language and need for interpreter reviewed:: Yes Do you feel safe going back to the place where you live?: Yes      Need for Family Participation in Patient Care: No (Comment) Care giver support system in place?: Yes (comment)   Criminal Activity/Legal Involvement Pertinent to Current Situation/Hospitalization: No - Comment as needed  Activities of Daily Living   ADL Screening (condition at time of admission) Independently performs ADLs?: Yes (appropriate for developmental age) Is the patient deaf or have difficulty hearing?: No Does the patient have difficulty seeing, even when wearing glasses/contacts?: No Does the patient have difficulty concentrating, remembering, or making decisions?: No  Permission Sought/Granted                  Emotional Assessment       Orientation: : Oriented to Self, Oriented to Place, Oriented to  Time Alcohol / Substance Use: Not Applicable Psych Involvement: No (comment)  Admission diagnosis:  Sepsis (HCC)  [A41.9] Sepsis, due to unspecified organism, unspecified whether acute organ dysfunction present [facility]) [A41.9] Patient Active Problem List   Diagnosis Date Noted   PAF (paroxysmal atrial fibrillation) (HCC) 05/28/2023   Medication management 05/28/2023   Hyponatremia 05/26/2023   Chronic atrial fibrillation with RVR (HCC) 05/26/2023   Sepsis (HCC) 05/26/2023   Atrial fibrillation with RVR (HCC) 05/26/2023   Spinal stenosis of lumbar region with neurogenic claudication 09/13/2022   Age-related osteoporosis without current pathological fracture 09/19/2020   Low back pain 03/22/2020   Displaced intertrochanteric fracture of left femur, subsequent encounter for closed fracture with routine healing 07/21/2018   History of malignant neoplasm of colon 07/21/2018   HTN (hypertension) 07/15/2018   Type 2 diabetes mellitus without complication (HCC) 08/01/2015   Breast mass in male 11/13/2012   PCP:  Noni Saupe, MD Pharmacy:   [facility] [phone] - [place],  - [address] [place] [address] Phone: [phone] Fax: [phone]     Social Drivers of Health (SDOH) Social History: SDOH Screenings   Food Insecurity: No Food Insecurity (05/26/2023)  Housing: Low Risk  (05/26/2023)  Transportation Needs: No Transportation Needs (05/26/2023)  Utilities: Not At Risk (05/26/2023)  Social Connections: Unknown (05/26/2023)  Tobacco Use: Low Risk  (05/25/2023)   SDOH Interventions:     Readmission Risk Interventions     No data  to display

## 2023-06-02 NOTE — Progress Notes (Signed)
 Progress Note    Jonathan Owen   ZOX:[id number]  DOB: 1927-08-04  DOA: 05/25/2023     7 PCP: Noni Saupe, MD  Initial CC: fever, chills, weakness, SOB  Hospital Course: [name] is a 89 yo male with PMH DMII, HTN, colon cancer, OA, prior PE who presented with SOB, fever, chills, weakness, and abdominal pain.  On presentation, he was tachycardiac, febrile.  Suspected to have sepsis of unclear etiology.  UA was not suspicoius  UTI.  Chest x-ray did not show any pneumonia.  Also went to A-fib with RVR in the emergency department.  Ultrasound of the abdomen/CT abdomen/pelvis showed acute cholecystitis. HIDA positive. General surgery, cardiology consulted. S/P IR guided percutaneous cholecystostomy drain on 2/11.  Clinically improving since then.  PT recommending SNF on discharge. Placement is pending.   Interval History:  No events overnight.  Daughter present bedside this morning as well.  He did have a little bit of dysuria reported treated with oxycodone overnight.  This morning complaining of no other symptoms.  Daughter concerned for possible UTI despite antibiotics; we will check urine studies today.  Assessment and Plan:  Sepsis due to acute cholecystitis - Presented with fever, leukocytosis, tachycardia.  He did not complain of severe abdominal pain on admission, right upper quadrant is mostly nontender -RUQ showed distended gallbladder with gallbladder wall thickening concerning for acute cholecystitis; CT also noting similar with pericholecystic fat stranding; HIDA also performed and positive - Underwent percutaneous drain placement with IR on 05/28/2023 -Bile culture growing E. coli ESBL; continue on Cipro and Flagyl; Case previously discussed with ID - outpt follow up with drain clinic with IR  New onset A-fib with RVR: was on Cardizem drip.  Cardiology consulted and was following.    - Anticoagulation changed to Eliquis.   - on diltiazem 180 mg daily.   -  Cardiology will follow him as an outpatient and plan for DCCV after at least 3 weeks of anticoagulation.  Dysuria - brief episode overnight; patient slightly poor historian and may have had some scrotal irritation too - will check UA  Blood culture contamination - 1/4 bottles with Rothia on admission; considered contamination    Insulin-dependent diabetes type 2: Recent A1c of 6.1.  Takes insulin at home.   - Continue sliding scale, long-acting insulin.  Monitor blood sugars   Hyponatremia: Improved Monitor BMP   Hypertension: Currently BP stable. Titrate meds as necessary   Weakness: Patient lives alone.  Ambulatory at baseline.   PT recommended SNF on discharge this is pending    Confusion/delirum improved : Alert and oriented at baseline.  This is most likely hospital-acquired delirium.  Minimize sedatives, narcotics continue frequent reorientation, delirium precautions.  This morning he was alert and oriented. Monitor   Scrotal maceration  Keep barrier cream as needed Scrotum elevation Keep dry - condom cath ok for now    Goals of care: Goals of care discussed by previous hospitalist with daughter [name].  She is expecting full recovery.  Patient is usually fully independent and healthy.   Patient remains full code.   Palliative care also consulted; full scope of care      Pressure Injury 05/27/23 Sacrum Medial Stage 1 -  Intact skin with non-blanchable redness of a localized area usually over a bony prominence. (Active)  05/27/23 1200  Location: Sacrum  Location Orientation: Medial  Staging: Stage 1 -  Intact skin with non-blanchable redness of a localized area usually over a bony prominence.  Wound Description (Comments):   Present on Admission: Yes  Dressing Type Foam - Lift dressing to assess site every shift 02/14/    Old records reviewed in assessment of this patient  Antimicrobials: Cefepime 05/26/2023 >> 05/28/2023 Zosyn 05/28/2023 >> 05/30/2023 Vancomycin  05/25/2023 >> 05/27/2023 Flagyl 05/25/2023 >> current Ciprofloxacin 05/30/2023 >> current  DVT prophylaxis:   apixaban (ELIQUIS) tablet 5 mg   Code Status:   Code Status: Full Code  Mobility Assessment (Last 72 Hours)     Mobility Assessment     Row Name 06/02/23 0856 06/01/23 2100 06/01/23 1351 06/01/23 1100 05/31/23 2000   Does patient have an order for bedrest or is patient medically unstable No - Continue assessment No - Continue assessment -- No - Continue assessment No - Continue assessment   What is the highest level of mobility based on the progressive mobility assessment? Level 4 (Walks with assist in room) - Balance while marching in place and cannot step forward and back - Complete Level 4 (Walks with assist in room) - Balance while marching in place and cannot step forward and back - Complete Level 4 (Walks with assist in room) - Balance while marching in place and cannot step forward and back - Complete Level 2 (Chairfast) - Balance while sitting on edge of bed and cannot stand Level 2 (Chairfast) - Balance while sitting on edge of bed and cannot stand   Is the above level different from baseline mobility prior to current illness? Yes - Recommend PT order Yes - Recommend PT order -- Yes - Recommend PT order --    Row Name 05/31/23 1645 05/31/23 0800 05/30/23 2000 05/30/23 1539     Does patient have an order for bedrest or is patient medically unstable No - Continue assessment No - Continue assessment No - Continue assessment --    What is the highest level of mobility based on the progressive mobility assessment? Level 2 (Chairfast) - Balance while sitting on edge of bed and cannot stand Level 2 (Chairfast) - Balance while sitting on edge of bed and cannot stand Level 1 (Bedfast) - Unable to balance while sitting on edge of bed Level 3 (Stands with assist) - Balance while standing  and cannot march in place    Is the above level different from baseline mobility prior to current illness?  Yes - Recommend PT order Yes - Recommend PT order -- --             Barriers to discharge: none Disposition Plan:  SNF HH orders placed: n/a Status is: Inpt  Objective: Blood pressure (!) 144/71, pulse (!) 104, temperature 98.1 F (36.7 C), temperature source Oral, resp. rate (!) 24, height 5\' 11"  (1.803 m), weight 97.6 kg, SpO2 95%.  Examination:  Physical Exam Constitutional:      General: He is not in acute distress.    Appearance: Normal appearance.  HENT:     Head: Normocephalic and atraumatic.     Mouth/Throat:     Mouth: Mucous membranes are moist.  Eyes:     Extraocular Movements: Extraocular movements intact.  Cardiovascular:     Rate and Rhythm: Normal rate and regular rhythm.  Pulmonary:     Effort: Pulmonary effort is normal. No respiratory distress.     Breath sounds: Normal breath sounds. No wheezing.  Abdominal:     General: Bowel sounds are normal. There is no distension.     Palpations: Abdomen is soft.     Tenderness: There is  abdominal tenderness (mild; right perc drain in place).  Genitourinary:    Comments: Mild erythema on scrotum, no large skin breakdown noted Musculoskeletal:        General: Normal range of motion.     Cervical back: Normal range of motion and neck supple.  Skin:    General: Skin is warm and dry.  Neurological:     General: No focal deficit present.     Mental Status: He is alert.     Motor: Weakness (generalized) present.  Psychiatric:        Mood and Affect: Mood normal.        Behavior: Behavior normal.      Consultants:  General surgery   Procedures:  05/28/2023: Percutaneous tube placement with IR  Data Reviewed: Results for orders placed or performed during the hospital encounter of 05/25/23 (from the past 24 hours)  Glucose, capillary     Status: Abnormal   Collection Time: 06/01/23  4:59 PM  Result Value Ref Range   Glucose-Capillary 172 (H) 70 - 99 mg/dL  Glucose, capillary     Status: Abnormal    Collection Time: 06/01/23  8:29 PM  Result Value Ref Range   Glucose-Capillary 144 (H) 70 - 99 mg/dL  Creatinine, serum     Status: Abnormal   Collection Time: 06/02/23  7:20 AM  Result Value Ref Range   Creatinine, Ser 0.55 (L) 0.61 - 1.24 mg/dL   GFR, Estimated >78 >29 mL/min  Glucose, capillary     Status: Abnormal   Collection Time: 06/02/23  7:59 AM  Result Value Ref Range   Glucose-Capillary 158 (H) 70 - 99 mg/dL  Glucose, capillary     Status: Abnormal   Collection Time: 06/02/23 11:51 AM  Result Value Ref Range   Glucose-Capillary 172 (H) 70 - 99 mg/dL    I have reviewed pertinent nursing notes, vitals, labs, and images as necessary. I have ordered labwork to follow up on as indicated.  I have reviewed the last notes from staff over past 24 hours. I have discussed patient's care plan and test results with nursing staff, CM/SW, and other staff as appropriate.  Time spent: Greater than 50% of the 55 minute visit was spent in counseling/coordination of care for the patient as laid out in the A&P.   LOS: 7 days   Lewie Chamber, MD [facility] 06/02/2023, 12:18 PM

## 2023-06-03 DIAGNOSIS — I4891 Unspecified atrial fibrillation: Secondary | ICD-10-CM | POA: Diagnosis not present

## 2023-06-03 DIAGNOSIS — A419 Sepsis, unspecified organism: Secondary | ICD-10-CM | POA: Diagnosis not present

## 2023-06-03 LAB — CBC WITH DIFFERENTIAL/PLATELET
Abs Immature Granulocytes: 0.23 10*3/uL — ABNORMAL HIGH (ref 0.00–0.07)
Basophils Absolute: 0.1 10*3/uL (ref 0.0–0.1)
Basophils Relative: 0 %
Eosinophils Absolute: 0.2 10*3/uL (ref 0.0–0.5)
Eosinophils Relative: 2 %
HCT: 36.7 % — ABNORMAL LOW (ref 39.0–52.0)
Hemoglobin: 11.3 g/dL — ABNORMAL LOW (ref 13.0–17.0)
Immature Granulocytes: 2 %
Lymphocytes Relative: 9 %
Lymphs Abs: 1.1 10*3/uL (ref 0.7–4.0)
MCH: 29.1 pg (ref 26.0–34.0)
MCHC: 30.8 g/dL (ref 30.0–36.0)
MCV: 94.6 fL (ref 80.0–100.0)
Monocytes Absolute: 1 10*3/uL (ref 0.1–1.0)
Monocytes Relative: 8 %
Neutro Abs: 9.4 10*3/uL — ABNORMAL HIGH (ref 1.7–7.7)
Neutrophils Relative %: 79 %
Platelets: 411 10*3/uL — ABNORMAL HIGH (ref 150–400)
RBC: 3.88 MIL/uL — ABNORMAL LOW (ref 4.22–5.81)
RDW: 14.1 % (ref 11.5–15.5)
WBC: 12 10*3/uL — ABNORMAL HIGH (ref 4.0–10.5)
nRBC: 0 % (ref 0.0–0.2)

## 2023-06-03 LAB — GLUCOSE, CAPILLARY
Glucose-Capillary: 156 mg/dL — ABNORMAL HIGH (ref 70–99)
Glucose-Capillary: 165 mg/dL — ABNORMAL HIGH (ref 70–99)

## 2023-06-03 LAB — BASIC METABOLIC PANEL
Anion gap: 9 (ref 5–15)
BUN: 12 mg/dL (ref 8–23)
CO2: 23 mmol/L (ref 22–32)
Calcium: 8.3 mg/dL — ABNORMAL LOW (ref 8.9–10.3)
Chloride: 98 mmol/L (ref 98–111)
Creatinine, Ser: 0.48 mg/dL — ABNORMAL LOW (ref 0.61–1.24)
GFR, Estimated: >60 mL/min (ref >60)
Glucose, Bld: 185 mg/dL — ABNORMAL HIGH (ref 70–99)
Potassium: 4.2 mmol/L (ref 3.5–5.1)
Sodium: 130 mmol/L — ABNORMAL LOW (ref 135–145)

## 2023-06-03 LAB — MAGNESIUM: Magnesium: 1.7 mg/dL (ref 1.7–2.4)

## 2023-06-03 MED ORDER — TOUJEO SOLOSTAR 300 UNIT/ML ~~LOC~~ SOPN: 10.0000 [IU] | PEN_INJECTOR | Freq: Every morning | SUBCUTANEOUS | Status: AC

## 2023-06-03 MED ORDER — ACETAMINOPHEN 325 MG PO TABS: 650.0000 mg | ORAL_TABLET | ORAL | Status: AC | PRN

## 2023-06-03 MED ORDER — INSULIN ASPART 100 UNIT/ML IJ SOLN
0.0000 [IU] | Freq: Three times a day (TID) | INTRAMUSCULAR | Status: DC
  Administered 2023-06-03: 1 [IU] via SUBCUTANEOUS

## 2023-06-03 MED ORDER — GABAPENTIN 300 MG PO CAPS: 300.0000 mg | ORAL_CAPSULE | Freq: Every day | ORAL | 0 refills | Status: AC

## 2023-06-03 MED ORDER — APIXABAN 5 MG PO TABS: 5.0000 mg | ORAL_TABLET | Freq: Two times a day (BID) | ORAL | Status: DC

## 2023-06-03 MED ORDER — ONDANSETRON HCL 4 MG PO TABS: 4.0000 mg | ORAL_TABLET | Freq: Four times a day (QID) | ORAL | Status: DC | PRN

## 2023-06-03 MED ORDER — CIPROFLOXACIN HCL 500 MG PO TABS: 500.0000 mg | ORAL_TABLET | Freq: Two times a day (BID) | ORAL | Status: AC

## 2023-06-03 MED ORDER — POLYETHYLENE GLYCOL 3350 17 G PO PACK: 17.0000 g | PACK | Freq: Every day | ORAL | Status: AC | PRN

## 2023-06-03 MED ORDER — LOPERAMIDE HCL 2 MG PO CAPS
2.0000 mg | ORAL_CAPSULE | Freq: Once | ORAL | Status: AC
  Administered 2023-06-03: 2 mg via ORAL
  Filled 2023-06-03: qty 1

## 2023-06-03 MED ORDER — SENNOSIDES-DOCUSATE SODIUM 8.6-50 MG PO TABS: 1.0000 | ORAL_TABLET | Freq: Two times a day (BID) | ORAL | Status: AC

## 2023-06-03 MED ORDER — METRONIDAZOLE 500 MG PO TABS: 500.0000 mg | ORAL_TABLET | Freq: Two times a day (BID) | ORAL | Status: AC

## 2023-06-03 MED ORDER — INSULIN ASPART 100 UNIT/ML IJ SOLN
1.0000 [IU] | Freq: Three times a day (TID) | INTRAMUSCULAR | Status: AC
Start: 2023-06-03 — End: ?

## 2023-06-03 MED ORDER — CLONAZEPAM 0.5 MG PO TABS: 0.5000 mg | ORAL_TABLET | Freq: Every day | ORAL | 0 refills | Status: AC | PRN

## 2023-06-03 MED ORDER — DILTIAZEM HCL ER COATED BEADS 180 MG PO CP24
180.0000 mg | ORAL_CAPSULE | Freq: Every day | ORAL | Status: DC
Start: 2023-06-04 — End: 2023-06-17

## 2023-06-03 NOTE — Discharge Summary (Signed)
 Physician Discharge Summary   Jonathan Owen UJW:[id number] DOB: [birth year] DOA: 05/25/2023  PCP: Noni Saupe, MD  Admit date: 05/25/2023 Discharge date: 06/03/2023  Admitted From: Home Disposition:  [facility] Discharging physician: Lewie Chamber, MD Barriers to discharge: none  Recommendations at discharge: Continue perc drain care. Follow up with IR drain clinic 07/09/23   Discharge Condition: stable CODE STATUS: Full  Diet recommendation:  Diet Orders (From admission, onward)     Start     Ordered   06/03/23 0000  Diet general        06/03/23 1016   05/28/23 1301  Diet regular Room service appropriate? Yes; Fluid consistency: Thin  Diet effective now       Question Answer Comment  Room service appropriate? Yes   Fluid consistency: Thin      05/28/23 1300            Hospital Course: Mr. Hert is a 89 yo male with PMH DMII, HTN, colon cancer, OA, prior PE who presented with SOB, fever, chills, weakness, and abdominal pain.  On presentation, he was tachycardiac, febrile.  Suspected to have sepsis of unclear etiology.  UA was not suspicoius  UTI.  Chest x-ray did not show any pneumonia.  Also went to A-fib with RVR in the emergency department.  Ultrasound of the abdomen/CT abdomen/pelvis showed acute cholecystitis. HIDA positive. General surgery, cardiology consulted. S/P IR guided percutaneous cholecystostomy drain on 2/11.  Clinically improving since then.  PT recommending SNF on discharge. Placement is pending.   Assessment and Plan:  Sepsis due to acute cholecystitis - Presented with fever, leukocytosis, tachycardia.  He did not complain of severe abdominal pain on admission, right upper quadrant is mostly nontender -RUQ showed distended gallbladder with gallbladder wall thickening concerning for acute cholecystitis; CT also noting similar with pericholecystic fat stranding; HIDA also performed and positive - Underwent percutaneous drain placement with IR on  05/28/2023. Follow up with drain clinic on 3/25 -Bile culture growing E. coli ESBL; continue on Cipro and Flagyl; Case previously discussed with ID - abx for 2 weeks post drain placement    New onset A-fib with RVR: was on Cardizem drip.  Cardiology consulted and was following.    - Anticoagulation changed to Eliquis.   - on diltiazem 180 mg daily.   - Cardiology will follow him as an outpatient and plan for DCCV after at least 3 weeks of anticoagulation.   Dysuria - brief episode overnight; patient slightly poor historian and may have had some scrotal irritation too - UA negative on 2/16   Blood culture contamination - 1/4 bottles with Rothia on admission; considered contamination    Insulin-dependent diabetes type 2:  - Recent A1c of 6.1.  Takes insulin at home.   - Continue sliding scale, long-acting insulin.   Hyponatremia: Improved Monitor BMP   Hypertension: Currently BP stable.   Weakness: Patient lives alone.  Ambulatory at baseline.   PT recommended SNF. Accept[facility]   Confusion/delirum improved : Alert and oriented at baseline.  This is most likely hospital-acquired delirium.  Minimize sedatives, narcotics continue frequent reorientation, delirium precautions.  This morning he was alert and oriented. Monitor   Scrotal maceration  Keep barrier cream as needed Scrotum elevation Keep dry - condom cath okay inpatient    Goals of care: Goals of care discussed by previous hospitalist with daughter [name].  She is expecting full recovery.  Patient is usually fully independent and healthy.   Patient remains full  code.   Palliative care also consulted; full scope of care         Pressure Injury 05/27/23 Sacrum Medial Stage 1 -  Intact skin with non-blanchable redness of a localized area usually over a bony prominence. (Active)  05/27/23 1200  Location: Sacrum  Location Orientation: Medial  Staging: Stage 1 -  Intact skin with non-blanchable redness of a localized  area usually over a bony prominence.  Wound Description (Comments):   Present on Admission: Yes  Dressing Type Foam - Lift dressing to assess site every shift 02/14/     Principal Diagnosis: Sepsis [facility])  Discharge Diagnoses: Active Hospital Problems   Diagnosis Date Noted   Sepsis (HCC) 05/26/2023   PAF (paroxysmal atrial fibrillation) (HCC) 05/28/2023   Medication management 05/28/2023   Hyponatremia 05/26/2023   Atrial fibrillation with RVR (HCC) 05/26/2023   HTN (hypertension) 07/15/2018   Type 2 diabetes mellitus without complication (HCC) 08/01/2015    Resolved Hospital Problems  No resolved problems to display.     Discharge Instructions     Diet general   Complete by: As directed    Drain care   Complete by: As directed    Drain instructions: Flushing regimen for perc drain: Normal Saline 5 mL three times daily.   Flush Drain: Yes   Flush amount: 5   Flush frequency: TID   Increase activity slowly   Complete by: As directed    No wound care   Complete by: As directed       Allergies as of 06/03/2023       Reactions   Tape Other (See Comments)   Unknown        Medication List     STOP taking these medications    amoxicillin 875 MG tablet Commonly known as: AMOXIL   aspirin 81 MG tablet   baclofen 10 MG tablet Commonly known as: LIORESAL   Insulin Aspart FlexPen 100 UNIT/ML Commonly known as: NOVOLOG Replaced by: insulin aspart 100 UNIT/ML injection   LORazepam 1 MG tablet Commonly known as: ATIVAN   traMADol 50 MG tablet Commonly known as: Ultram   zolpidem 5 MG tablet Commonly known as: AMBIEN       TAKE these medications    acetaminophen 325 MG tablet Commonly known as: TYLENOL Take 2 tablets (650 mg total) by mouth every 4 (four) hours as needed for mild pain (pain score 1-3), moderate pain (pain score 4-6), fever or headache (or Fever >/= 101).   apixaban 5 MG Tabs tablet Commonly known as: ELIQUIS Take 1 tablet (5 mg  total) by mouth 2 (two) times daily.   cholecalciferol 25 MCG (1000 UNIT) tablet Commonly known as: VITAMIN D3 Take 3,000 Units by mouth daily.   ciprofloxacin 500 MG tablet Commonly known as: CIPRO Take 1 tablet (500 mg total) by mouth 2 (two) times daily for 8 days.   clonazePAM 0.5 MG tablet Commonly known as: KLONOPIN Take 1 tablet (0.5 mg total) by mouth daily as needed. What changed: when to take this   diltiazem 180 MG 24 hr capsule Commonly known as: CARDIZEM CD Take 1 capsule (180 mg total) by mouth daily. Start taking on: June 04, 2023   gabapentin 300 MG capsule Commonly known as: NEURONTIN Take 1 capsule (300 mg total) by mouth at bedtime.   insulin aspart 100 UNIT/ML injection Commonly known as: novoLOG Inject 1-6 Units into the skin 4 (four) times daily -  before meals and at bedtime. CBG 151 -  200: 1 unit, CBG 201-250: 2 units, CBG 251-300: 3 units, CBG 301-350: 4 units, CBG 351-400: 5 units, CBG > 400: Give 6 units Replaces: Insulin Aspart FlexPen 100 UNIT/ML   Melatonin 10 MG Tabs Take 20 mg by mouth at bedtime as needed (Sleep).   metroNIDAZOLE 500 MG tablet Commonly known as: FLAGYL Take 1 tablet (500 mg total) by mouth every 12 (twelve) hours for 8 days.   ondansetron 4 MG tablet Commonly known as: ZOFRAN Take 1 tablet (4 mg total) by mouth every 6 (six) hours as needed for nausea or vomiting.   polyethylene glycol 17 g packet Commonly known as: MIRALAX / GLYCOLAX Take 17 g by mouth daily as needed.   senna-docusate 8.6-50 MG tablet Commonly known as: Senokot-S Take 1 tablet by mouth 2 (two) times daily.   tamsulosin 0.4 MG Caps capsule Commonly known as: FLOMAX Take 0.4 mg by mouth daily.   Toujeo SoloStar 300 UNIT/ML Solostar Pen Generic drug: insulin glargine (1 Unit Dial) Inject 10 Units into the skin in the morning. What changed: how much to take        Contact information for follow-up providers     Eustace Pen, PA-C  Follow up.   Specialty: Cardiology Why: 06/13/23 at 1030am Contact information: [address] [place] [address] [phone]              Contact information for after-discharge care     Destination     [facility]erred [facility] .   Service: Skilled Nursing Contact information: [address] Test Update Address [place] [address] [phone]                    Allergies  Allergen Reactions   Tape Other (See Comments)    Unknown    Consultations: General surgery IR  Procedures: 2/11: Perc drain placement   Discharge Exam: BP (!) 140/60 (BP Location: Left Arm)   Pulse 88   Temp 98.3 F (36.8 C) (Oral)   Resp 19   Ht 5\' 11"  (1.803 m)   Wt 97.6 kg   SpO2 97%   BMI 30.01 kg/m  Physical Exam Constitutional:      General: He is not in acute distress.    Appearance: Normal appearance.  HENT:     Head: Normocephalic and atraumatic.     Mouth/Throat:     Mouth: Mucous membranes are moist.  Eyes:     Extraocular Movements: Extraocular movements intact.  Cardiovascular:     Rate and Rhythm: Normal rate and regular rhythm.  Pulmonary:     Effort: Pulmonary effort is normal. No respiratory distress.     Breath sounds: Normal breath sounds. No wheezing.  Abdominal:     General: Bowel sounds are normal. There is no distension.     Palpations: Abdomen is soft.     Tenderness: There is abdominal tenderness (mild; right perc drain in place).  Genitourinary:    Comments: Mild erythema on scrotum, no large skin breakdown noted Musculoskeletal:        General: Normal range of motion.     Cervical back: Normal range of motion and neck supple.  Skin:    General: Skin is warm and dry.  Neurological:     General: No focal deficit present.     Mental Status: He is alert.     Motor: Weakness (generalized) present.  Psychiatric:        Mood and Affect: Mood normal.  Behavior: Behavior normal.      The results of  significant diagnostics from this hospitalization (including imaging, microbiology, ancillary and laboratory) are listed below for reference.   Microbiology: Recent Results (from the past 240 hours)  Culture, blood (Routine x 2)     Status: Abnormal   Collection Time: 05/25/23  8:43 PM   Specimen: BLOOD  Result Value Ref Range Status   Specimen Description   Final    BLOOD BLOOD LEFT ARM Performed at [facility], [address] [place], [place] [address]    Special Requests   Final    Blood Culture results may not be optimal due to an inadequate volume of blood received in culture bottles BOTTLES DRAWN AEROBIC AND ANAEROBIC Performed at [facility], [address] [place], [place] [address]    Culture  Setup Time   Final    GRAM POSITIVE COCCI ANAEROBIC BOTTLE ONLY CRITICAL RESULT CALLED TO, READ BACK BY AND VERIFIED WITH: PHARMD JUSTIN LEGGE ON 05/27/23 @ 1842 BY DRT    Culture (A)  Final    ROTHIA MUCILAGINOSA Standardized susceptibility testing for this organism is not available. Performed at [facility], [address] [place], [place] [address]    Report Status 05/28/2023 FINAL  Final  Blood Culture ID Panel (Reflexed)     Status: None   Collection Time: 05/25/23  8:43 PM  Result Value Ref Range Status   Enterococcus faecalis NOT DETECTED NOT DETECTED Final   Enterococcus Faecium NOT DETECTED NOT DETECTED Final   Listeria monocytogenes NOT DETECTED NOT DETECTED Final   Staphylococcus species NOT DETECTED NOT DETECTED Final   Staphylococcus aureus (BCID) NOT DETECTED NOT DETECTED Final   Staphylococcus epidermidis NOT DETECTED NOT DETECTED Final   Staphylococcus lugdunensis NOT DETECTED NOT DETECTED Final   Streptococcus species NOT DETECTED NOT DETECTED Final   Streptococcus agalactiae NOT DETECTED NOT DETECTED Final   Streptococcus pneumoniae NOT DETECTED NOT DETECTED Final   Streptococcus pyogenes NOT DETECTED NOT DETECTED  Final   A.calcoaceticus-baumannii NOT DETECTED NOT DETECTED Final   Bacteroides fragilis NOT DETECTED NOT DETECTED Final   Enterobacterales NOT DETECTED NOT DETECTED Final   Enterobacter cloacae complex NOT DETECTED NOT DETECTED Final   Escherichia coli NOT DETECTED NOT DETECTED Final   Klebsiella aerogenes NOT DETECTED NOT DETECTED Final   Klebsiella oxytoca NOT DETECTED NOT DETECTED Final   Klebsiella pneumoniae NOT DETECTED NOT DETECTED Final   Proteus species NOT DETECTED NOT DETECTED Final   Salmonella species NOT DETECTED NOT DETECTED Final   Serratia marcescens NOT DETECTED NOT DETECTED Final   Haemophilus influenzae NOT DETECTED NOT DETECTED Final   Neisseria meningitidis NOT DETECTED NOT DETECTED Final   Pseudomonas aeruginosa NOT DETECTED NOT DETECTED Final   Stenotrophomonas maltophilia NOT DETECTED NOT DETECTED Final   Candida albicans NOT DETECTED NOT DETECTED Final   Candida auris NOT DETECTED NOT DETECTED Final   Candida glabrata NOT DETECTED NOT DETECTED Final   Candida krusei NOT DETECTED NOT DETECTED Final   Candida parapsilosis NOT DETECTED NOT DETECTED Final   Candida tropicalis NOT DETECTED NOT DETECTED Final   Cryptococcus neoformans/gattii NOT DETECTED NOT DETECTED Final    Comment: Performed at [facility], [address], [place], [place] [address]  Culture, blood (Routine x 2)     Status: None   Collection Time: 05/25/23  8:45 PM   Specimen: BLOOD  Result Value Ref Range Status   Specimen Description  Final    BLOOD BLOOD RIGHT FOREARM Performed at [facility], [address] [place], [place] [address]    Special Requests   Final    Blood Culture results may not be optimal due to an inadequate volume of blood received in culture bottles BOTTLES DRAWN AEROBIC AND ANAEROBIC Performed at [facility], [address] [place], [place] [address]    Culture   Final    NO GROWTH 5 DAYS Performed at [facility], [address], [place], [place] [address]    Report Status 05/31/2023 FINAL  Final  Resp panel by RT-PCR (RSV, Flu A&B, Covid)     Status: None   Collection Time: 05/25/23  9:05 PM  Result Value Ref Range Status   SARS Coronavirus 2 by RT PCR NEGATIVE NEGATIVE Final    Comment: (NOTE) SARS-CoV-2 target nucleic acids are NOT DETECTED.  The SARS-CoV-2 RNA is generally detectable in upper respiratory specimens during the acute phase of infection. The lowest concentration of SARS-CoV-2 viral copies this assay can detect is 138 copies/mL. A negative result does not preclude SARS-Cov-2 infection and should not be used as the sole basis for treatment or other patient management decisions. A negative result may occur with  improper specimen collection/handling, submission of specimen other than nasopharyngeal swab, presence of viral mutation(s) within the areas targeted by this assay, and inadequate number of viral copies(<138 copies/mL). A negative result must be combined with clinical observations, patient history, and epidemiological information. The expected result is Negative.  Fact Sheet for Patients:  BloggerCourse.com  Fact Sheet for Healthcare Providers:  SeriousBroker.it  This test is no t yet approved or cleared by the [place] FDA and  has been authorized for detection and/or diagnosis of SARS-CoV-2 by FDA under an Emergency Use Authorization (EUA). This EUA will remain  in effect (meaning this test can be used) for the duration of the COVID-19 declaration under Section 564(b)(1) of the Act, 21 U.S.C.section 360bbb-3(b)(1), unless the authorization is terminated  or revoked sooner.       Influenza A by PCR NEGATIVE NEGATIVE Final   Influenza B by PCR NEGATIVE NEGATIVE Final    Comment: (NOTE) The Xpert Xpress SARS-CoV-2/FLU/RSV plus assay is intended as an aid in the diagnosis of influenza from  Nasopharyngeal swab specimens and should not be used as a sole basis for treatment. Nasal washings and aspirates are unacceptable for Xpert Xpress SARS-CoV-2/FLU/RSV testing.  Fact Sheet for Patients: BloggerCourse.com  Fact Sheet for Healthcare Providers: SeriousBroker.it  This test is not yet approved or cleared by the [place] FDA and has been authorized for detection and/or diagnosis of SARS-CoV-2 by FDA under an Emergency Use Authorization (EUA). This EUA will remain in effect (meaning this test can be used) for the duration of the COVID-19 declaration under Section 564(b)(1) of the Act, 21 U.S.C. section 360bbb-3(b)(1), unless the authorization is terminated or revoked.     Resp Syncytial Virus by PCR NEGATIVE NEGATIVE Final    Comment: (NOTE) Fact Sheet for Patients: BloggerCourse.com  Fact Sheet for Healthcare Providers: SeriousBroker.it  This test is not yet approved or cleared by the [place] FDA and has been authorized for detection and/or diagnosis of SARS-CoV-2 by FDA under an Emergency Use Authorization (EUA). This EUA will remain in effect (meaning this test can be used) for the duration of the COVID-19 declaration under Section 564(b)(1) of the Act, 21 U.S.C. section 360bbb-3(b)(1), unless the authorization is terminated or  revoked.  Performed at [facility], [address] [place], [place] [address]   MRSA Next Gen by PCR, Nasal     Status: None   Collection Time: 05/27/23 12:13 PM   Specimen: Nasal Mucosa; Nasal Swab  Result Value Ref Range Status   MRSA by PCR Next Gen NOT DETECTED NOT DETECTED Final    Comment: (NOTE) The GeneXpert MRSA Assay (FDA approved for NASAL specimens only), is one component of a comprehensive MRSA colonization surveillance program. It is not intended to diagnose MRSA infection nor to guide or  monitor treatment for MRSA infections. Test performance is not FDA approved in patients less than 27 years old. Performed at [facility], [address], [place], [place] [address]   Aerobic/Anaerobic Culture w Gram Stain (surgical/deep wound)     Status: None   Collection Time: 05/28/23 12:47 PM   Specimen: BILE  Result Value Ref Range Status   Specimen Description   Final    BILE Performed at [facility], [address], [place], [place] [address]    Special Requests   Final    NONE Performed at [facility], [address] [place], [place] [address]    Gram Stain NO WBC SEEN FEW GRAM NEGATIVE RODS   Final   Culture   Final    MODERATE ESCHERICHIA COLI Confirmed Extended Spectrum Beta-Lactamase Producer (ESBL).  In bloodstream infections from ESBL organisms, carbapenems are preferred over piperacillin/tazobactam. They are shown to have a lower risk of mortality. NO ANAEROBES ISOLATED Performed at [facility], [address], [place], [place] [address]    Report Status 06/02/2023 FINAL  Final   Organism ID, Bacteria ESCHERICHIA COLI  Final      Susceptibility   Escherichia coli - MIC*    AMPICILLIN >=32 RESISTANT Resistant     CEFEPIME <=0.12 SENSITIVE Sensitive     CEFTAZIDIME RESISTANT Resistant     CEFTRIAXONE 1 SENSITIVE Sensitive     CIPROFLOXACIN <=0.25 SENSITIVE Sensitive     GENTAMICIN <=1 SENSITIVE Sensitive     IMIPENEM <=0.25 SENSITIVE Sensitive     TRIMETH/SULFA <=20 SENSITIVE Sensitive     AMPICILLIN/SULBACTAM >=32 RESISTANT Resistant     PIP/TAZO 64 INTERMEDIATE Intermediate ug/mL    * MODERATE ESCHERICHIA COLI     Labs: BNP (last 3 results) No results for input(s): "BNP" in the last 8760 hours. Basic Metabolic Panel: Recent Labs  Lab 05/27/23 1057 05/28/23 0330 05/29/23 0306 05/30/23 0733 05/31/23 0629 06/01/23 0406 06/02/23 0720 06/03/23 0402  NA  --    < > 131* 133* 133* 134*   --  130*  K  --    < > 4.3 3.9 4.0 4.2  --  4.2  CL  --    < > 101 103 100 102  --  98  CO2  --    < > 20* 22 23 25   --  23  GLUCOSE  --    < > 177* 207* 203* 170*  --  185*  BUN  --    < > 19 17 20 16   --  12  CREATININE  --    < > 0.78 0.62 0.59* 0.49* 0.55* 0.48*  CALCIUM  --    < > 7.4* 7.6* 8.2* 8.0*  --  8.3*  MG 1.3*  --   --   --  2.0  --   --  1.7   < > =  values in this interval not displayed.   Liver Function Tests: Recent Labs  Lab 05/28/23 0330 05/29/23 0306  AST 35 23  ALT 27 23  ALKPHOS 59 53  BILITOT 0.9 1.1  PROT 5.3* 5.1*  ALBUMIN 2.2* 2.1*   Recent Labs  Lab 05/27/23 1057  LIPASE 24   No results for input(s): "AMMONIA" in the last 168 hours. CBC: Recent Labs  Lab 05/29/23 0306 05/30/23 0733 05/31/23 0629 06/01/23 0406 06/03/23 0402  WBC 10.5 10.5 11.1* 11.7* 12.0*  NEUTROABS  --   --   --   --  9.4*  HGB 11.0* 11.1* 11.6* 11.2* 11.3*  HCT 35.6* 34.6* 37.2* 35.5* 36.7*  MCV 93.7 90.6 91.9 92.9 94.6  PLT 219 273 350 374 411*   Cardiac Enzymes: No results for input(s): "CKTOTAL", "CKMB", "CKMBINDEX", "TROPONINI" in the last 168 hours. BNP: Invalid input(s): "POCBNP" CBG: Recent Labs  Lab 06/02/23 0759 06/02/23 1151 06/02/23 1656 06/02/23 2104 06/03/23 0733  GLUCAP 158* 172* 177* 200* 156*   D-Dimer No results for input(s): "DDIMER" in the last 72 hours. Hgb A1c No results for input(s): "HGBA1C" in the last 72 hours. Lipid Profile No results for input(s): "CHOL", "HDL", "LDLCALC", "TRIG", "CHOLHDL", "LDLDIRECT" in the last 72 hours. Thyroid function studies No results for input(s): "TSH", "T4TOTAL", "T3FREE", "THYROIDAB" in the last 72 hours.  Invalid input(s): "FREET3" Anemia work up No results for input(s): "VITAMINB12", "FOLATE", "FERRITIN", "TIBC", "IRON", "RETICCTPCT" in the last 72 hours. Urinalysis    Component Value Date/Time   COLORURINE AMBER (A) 06/02/2023 1407   APPEARANCEUR CLEAR 06/02/2023 1407   LABSPEC 1.020  06/02/2023 1407   PHURINE 5.0 06/02/2023 1407   GLUCOSEU NEGATIVE 06/02/2023 1407   HGBUR NEGATIVE 06/02/2023 1407   BILIRUBINUR NEGATIVE 06/02/2023 1407   KETONESUR NEGATIVE 06/02/2023 1407   PROTEINUR NEGATIVE 06/02/2023 1407   NITRITE NEGATIVE 06/02/2023 1407   LEUKOCYTESUR TRACE (A) 06/02/2023 1407   Sepsis Labs Recent Labs  Lab 05/30/23 0733 05/31/23 0629 06/01/23 0406 06/03/23 0402  WBC 10.5 11.1* 11.7* 12.0*   Microbiology Recent Results (from the past 240 hours)  Culture, blood (Routine x 2)     Status: Abnormal   Collection Time: 05/25/23  8:43 PM   Specimen: BLOOD  Result Value Ref Range Status   Specimen Description   Final    BLOOD BLOOD LEFT ARM Performed at [facility], [address] [place], [place] [address]    Special Requests   Final    Blood Culture results may not be optimal due to an inadequate volume of blood received in culture bottles BOTTLES DRAWN AEROBIC AND ANAEROBIC Performed at [facility], [address] [place], [place] [address]    Culture  Setup Time   Final    GRAM POSITIVE COCCI ANAEROBIC BOTTLE ONLY CRITICAL RESULT CALLED TO, READ BACK BY AND VERIFIED WITH: PHARMD JUSTIN LEGGE ON 05/27/23 @ 1842 BY DRT    Culture (A)  Final    ROTHIA MUCILAGINOSA Standardized susceptibility testing for this organism is not available. Performed at [facility], [address] [place], [place] [address]    Report Status 05/28/2023 FINAL  Final  Blood Culture ID Panel (Reflexed)     Status: None   Collection Time: 05/25/23  8:43 PM  Result Value Ref Range Status   Enterococcus faecalis NOT DETECTED NOT DETECTED Final   Enterococcus Faecium NOT DETECTED NOT DETECTED Final   Listeria monocytogenes NOT DETECTED NOT DETECTED Final  Staphylococcus species NOT DETECTED NOT DETECTED Final   Staphylococcus aureus (BCID) NOT DETECTED NOT DETECTED Final   Staphylococcus epidermidis NOT DETECTED NOT DETECTED  Final   Staphylococcus lugdunensis NOT DETECTED NOT DETECTED Final   Streptococcus species NOT DETECTED NOT DETECTED Final   Streptococcus agalactiae NOT DETECTED NOT DETECTED Final   Streptococcus pneumoniae NOT DETECTED NOT DETECTED Final   Streptococcus pyogenes NOT DETECTED NOT DETECTED Final   A.calcoaceticus-baumannii NOT DETECTED NOT DETECTED Final   Bacteroides fragilis NOT DETECTED NOT DETECTED Final   Enterobacterales NOT DETECTED NOT DETECTED Final   Enterobacter cloacae complex NOT DETECTED NOT DETECTED Final   Escherichia coli NOT DETECTED NOT DETECTED Final   Klebsiella aerogenes NOT DETECTED NOT DETECTED Final   Klebsiella oxytoca NOT DETECTED NOT DETECTED Final   Klebsiella pneumoniae NOT DETECTED NOT DETECTED Final   Proteus species NOT DETECTED NOT DETECTED Final   Salmonella species NOT DETECTED NOT DETECTED Final   Serratia marcescens NOT DETECTED NOT DETECTED Final   Haemophilus influenzae NOT DETECTED NOT DETECTED Final   Neisseria meningitidis NOT DETECTED NOT DETECTED Final   Pseudomonas aeruginosa NOT DETECTED NOT DETECTED Final   Stenotrophomonas maltophilia NOT DETECTED NOT DETECTED Final   Candida albicans NOT DETECTED NOT DETECTED Final   Candida auris NOT DETECTED NOT DETECTED Final   Candida glabrata NOT DETECTED NOT DETECTED Final   Candida krusei NOT DETECTED NOT DETECTED Final   Candida parapsilosis NOT DETECTED NOT DETECTED Final   Candida tropicalis NOT DETECTED NOT DETECTED Final   Cryptococcus neoformans/gattii NOT DETECTED NOT DETECTED Final    Comment: Performed at [facility], [address], [place], [place] [address]  Culture, blood (Routine x 2)     Status: None   Collection Time: 05/25/23  8:45 PM   Specimen: BLOOD  Result Value Ref Range Status   Specimen Description   Final    BLOOD BLOOD RIGHT FOREARM Performed at [facility], [address] [place], [place] [address]    Special Requests   Final    Blood  Culture results may not be optimal due to an inadequate volume of blood received in culture bottles BOTTLES DRAWN AEROBIC AND ANAEROBIC Performed at [facility], [address] [place], [place] [address]    Culture   Final    NO GROWTH 5 DAYS Performed at [facility], [address] [place], [place] [address]    Report Status 05/31/2023 FINAL  Final  Resp panel by RT-PCR (RSV, Flu A&B, Covid)     Status: None   Collection Time: 05/25/23  9:05 PM  Result Value Ref Range Status   SARS Coronavirus 2 by RT PCR NEGATIVE NEGATIVE Final    Comment: (NOTE) SARS-CoV-2 target nucleic acids are NOT DETECTED.  The SARS-CoV-2 RNA is generally detectable in upper respiratory specimens during the acute phase of infection. The lowest concentration of SARS-CoV-2 viral copies this assay can detect is 138 copies/mL. A negative result does not preclude SARS-Cov-2 infection and should not be used as the sole basis for treatment or other patient management decisions. A negative result may occur with  improper specimen collection/handling, submission of specimen other than nasopharyngeal swab, presence of viral mutation(s) within the areas targeted by this assay, and inadequate number of viral copies(<138 copies/mL). A negative result must be combined with clinical observations, patient history, and epidemiological information. The expected result is Negative.  Fact Sheet for Patients:  BloggerCourse.com  Fact Sheet for Healthcare  Providers:  SeriousBroker.it  This test is no t yet approved or cleared by the [place] and  has been authorized for detection and/or diagnosis of SARS-CoV-2 by FDA under an Emergency Use Authorization (EUA). This EUA will remain  in effect (meaning this test can be used) for the duration of the COVID-19 declaration under Section 564(b)(1) of the Act, 21 U.S.C.section 360bbb-3(b)(1),  unless the authorization is terminated  or revoked sooner.       Influenza A by PCR NEGATIVE NEGATIVE Final   Influenza B by PCR NEGATIVE NEGATIVE Final    Comment: (NOTE) The Xpert Xpress SARS-CoV-2/FLU/RSV plus assay is intended as an aid in the diagnosis of influenza from Nasopharyngeal swab specimens and should not be used as a sole basis for treatment. Nasal washings and aspirates are unacceptable for Xpert Xpress SARS-CoV-2/FLU/RSV testing.  Fact Sheet for Patients: BloggerCourse.com  Fact Sheet for Healthcare Providers: SeriousBroker.it  This test is not yet approved or cleared by the [place] FDA and has been authorized for detection and/or diagnosis of SARS-CoV-2 by FDA under an Emergency Use Authorization (EUA). This EUA will remain in effect (meaning this test can be used) for the duration of the COVID-19 declaration under Section 564(b)(1) of the Act, 21 U.S.C. section 360bbb-3(b)(1), unless the authorization is terminated or revoked.     Resp Syncytial Virus by PCR NEGATIVE NEGATIVE Final    Comment: (NOTE) Fact Sheet for Patients: BloggerCourse.com  Fact Sheet for Healthcare Providers: SeriousBroker.it  This test is not yet approved or cleared by the [place] FDA and has been authorized for detection and/or diagnosis of SARS-CoV-2 by FDA under an Emergency Use Authorization (EUA). This EUA will remain in effect (meaning this test can be used) for the duration of the COVID-19 declaration under Section 564(b)(1) of the Act, 21 U.S.C. section 360bbb-3(b)(1), unless the authorization is terminated or revoked.  Performed at [facility], [address] [place], [place] [address]   MRSA Next Gen by PCR, Nasal     Status: None   Collection Time: 05/27/23 12:13 PM   Specimen: Nasal Mucosa; Nasal Swab  Result Value Ref Range Status    MRSA by PCR Next Gen NOT DETECTED NOT DETECTED Final    Comment: (NOTE) The GeneXpert MRSA Assay (FDA approved for NASAL specimens only), is one component of a comprehensive MRSA colonization surveillance program. It is not intended to diagnose MRSA infection nor to guide or monitor treatment for MRSA infections. Test performance is not FDA approved in patients less than 18 years old. Performed at [facility], [address], [place], [place] [address]   Aerobic/Anaerobic Culture w Gram Stain (surgical/deep wound)     Status: None   Collection Time: 05/28/23 12:47 PM   Specimen: BILE  Result Value Ref Range Status   Specimen Description   Final    BILE Performed at [facility], [address], [place], [place] [address]    Special Requests   Final    NONE Performed at [facility], [address] [place], [place] [address]    Gram Stain NO WBC SEEN FEW GRAM NEGATIVE RODS   Final   Culture   Final    MODERATE ESCHERICHIA COLI Confirmed Extended Spectrum Beta-Lactamase Producer (ESBL).  In bloodstream infections from ESBL organisms, carbapenems are preferred over piperacillin/tazobactam. They are shown to have a lower risk of mortality. NO ANAEROBES ISOLATED Performed at [facility], [address] [place], [place] [address]    Report Status 06/02/2023 FINAL  Final   Organism ID, Bacteria ESCHERICHIA COLI  Final      Susceptibility   Escherichia coli - MIC*    AMPICILLIN >=32 RESISTANT Resistant     CEFEPIME <=0.12 SENSITIVE Sensitive     CEFTAZIDIME RESISTANT Resistant     CEFTRIAXONE 1 SENSITIVE Sensitive     CIPROFLOXACIN <=0.25 SENSITIVE Sensitive     GENTAMICIN <=1 SENSITIVE Sensitive     IMIPENEM <=0.25 SENSITIVE Sensitive     TRIMETH/SULFA <=20 SENSITIVE Sensitive     AMPICILLIN/SULBACTAM >=32 RESISTANT Resistant     PIP/TAZO 64 INTERMEDIATE Intermediate ug/mL    * MODERATE ESCHERICHIA COLI     Procedures/Studies: DG CHEST PORT 1 VIEW Result Date: 05/29/2023 CLINICAL DATA:  Shortness of breath EXAM: PORTABLE CHEST 1 VIEW COMPARISON:  X-ray 05/26/2023. FINDINGS: Film is under penetrated. Underinflated x-ray. Enlarged cardiopericardial silhouette calcified aorta. Bronchovascular crowding. Question tiny effusions with some adjacent lung base opacities. Atelectasis versus infiltrate. Recommend follow-up. Overlapping cardiac leads. IMPRESSION: Underinflation. Postop chest. Enlarged cardiopericardial silhouette. Lung base opacities with tiny effusions.  Recommend follow-up Electronically Signed   By: Karen Kays M.D.   On: 05/29/2023 14:49   IR Perc Cholecystostomy Result Date: 05/28/2023 INDICATION: [id number] Cholecystitis, acute [id number] Briefly, 89 year old male comorbid with gallbladder distention and POSITIVE HIDA scan. EXAM: Procedures: 1. LIMITED ABDOMINAL ULTRASOUND 2. PERCUTANEOUS CHOLECYSTOSTOMY TUBE PLACEMENT COMPARISON:  CT AP, 05/26/2023.  NM HIDA, 05/27/2023. MEDICATIONS: The patient is currently admitted to the hospital and on intravenous antibiotics. Antibiotics were administered within an appropriate time frame prior to skin puncture. ANESTHESIA/SEDATION: Moderate (conscious) sedation was employed during this procedure. A total of Versed 2 mg and Fentanyl 125 mcg was administered intravenously. Moderate Sedation Time: 22 minutes. The patient's level of consciousness and vital signs were monitored continuously by radiology nursing throughout the procedure under my direct supervision. CONTRAST:  20mL OMNIPAQUE IOHEXOL 300 MG/ML SOLN - administered into the gallbladder fossa. FLUOROSCOPY TIME:  Fluoroscopic dose; 8 mGy COMPLICATIONS: None immediate. PROCEDURE: Informed written consent was obtained from the patient after a discussion of the risks, benefits and alternatives to treatment. Questions regarding the procedure were encouraged and answered. A timeout was performed prior to the  initiation of the procedure. The RIGHT upper abdominal quadrant was prepped and draped in the usual sterile fashion, and a sterile drape was applied covering the operative field. Maximum barrier sterile technique with sterile gowns and gloves were used for the procedure. A timeout was performed prior to the initiation of the procedure. Local anesthesia was provided with 1% lidocaine with epinephrine. Limited abdominal ultrasound scanning of the RIGHT upper quadrant demonstrates a markedly dilated gallbladder. Of note, the patient reported pain with ultrasound imaging over the gallbladder. Utilizing a transhepatic approach, a 22 gauge needle was advanced into the gallbladder under direct ultrasound guidance. An ultrasound image was saved for documentation purposes. Appropriate intraluminal puncture was confirmed with the efflux of bile and advancement of an 0.018 wire into the gallbladder lumen. The needle was exchanged for an Accustick set. A small amount of contrast was injected to confirm appropriate intraluminal positioning. Over a Benson wire, a 10.2-French Cook cholecystomy tube was advanced into the gallbladder fossa, coiled and locked. Bile was aspirated and a small amount of contrast was injected as several post procedural spot radiographic images were obtained in various obliquities. The catheter was secured to the skin with suture, connected to a drainage bag and a dressing was placed. The  patient tolerated the procedure well without immediate post procedural complication. IMPRESSION: 1. Distended gallbladder with gallbladder wall thickening. 2. Successful ultrasound and fluoroscopic guided placement of a 10 Fr percutaneous cholecystostomy tube, via a transhepatic approach. RECOMMENDATIONS: The patient will return to Vascular Interventional Radiology (VIR) for routine drainage catheter evaluation and exchange in 6 weeks. Roanna Banning, MD [facility] Radiology  Electronically Signed   By: Roanna Banning M.D.   On: 05/28/2023 16:37   US Abdomen Limited RUQ (LIVER/GB) Result Date: 05/28/2023 INDICATION: [id number] Cholecystitis, acute [id number] Briefly, 89 year old male comorbid with gallbladder distention and POSITIVE HIDA scan. EXAM: Procedures: 1. LIMITED ABDOMINAL ULTRASOUND 2. PERCUTANEOUS CHOLECYSTOSTOMY TUBE PLACEMENT COMPARISON:  CT AP, 05/26/2023.  NM HIDA, 05/27/2023. MEDICATIONS: The patient is currently admitted to the hospital and on intravenous antibiotics. Antibiotics were administered within an appropriate time frame prior to skin puncture. ANESTHESIA/SEDATION: Moderate (conscious) sedation was employed during this procedure. A total of Versed 2 mg and Fentanyl 125 mcg was administered intravenously. Moderate Sedation Time: 22 minutes. The patient's level of consciousness and vital signs were monitored continuously by radiology nursing throughout the procedure under my direct supervision. CONTRAST:  20mL OMNIPAQUE IOHEXOL 300 MG/ML SOLN - administered into the gallbladder fossa. FLUOROSCOPY TIME:  Fluoroscopic dose; 8 mGy COMPLICATIONS: None immediate. PROCEDURE: Informed written consent was obtained from the patient after a discussion of the risks, benefits and alternatives to treatment. Questions regarding the procedure were encouraged and answered. A timeout was performed prior to the initiation of the procedure. The RIGHT upper abdominal quadrant was prepped and draped in the usual sterile fashion, and a sterile drape was applied covering the operative field. Maximum barrier sterile technique with sterile gowns and gloves were used for the procedure. A timeout was performed prior to the initiation of the procedure. Local anesthesia was provided with 1% lidocaine with epinephrine. Limited abdominal ultrasound scanning of the RIGHT upper quadrant demonstrates a markedly dilated gallbladder. Of note, the patient reported pain with ultrasound imaging over the  gallbladder. Utilizing a transhepatic approach, a 22 gauge needle was advanced into the gallbladder under direct ultrasound guidance. An ultrasound image was saved for documentation purposes. Appropriate intraluminal puncture was confirmed with the efflux of bile and advancement of an 0.018 wire into the gallbladder lumen. The needle was exchanged for an Accustick set. A small amount of contrast was injected to confirm appropriate intraluminal positioning. Over a Benson wire, a 10.2-French Cook cholecystomy tube was advanced into the gallbladder fossa, coiled and locked. Bile was aspirated and a small amount of contrast was injected as several post procedural spot radiographic images were obtained in various obliquities. The catheter was secured to the skin with suture, connected to a drainage bag and a dressing was placed. The patient tolerated the procedure well without immediate post procedural complication. IMPRESSION: 1. Distended gallbladder with gallbladder wall thickening. 2. Successful ultrasound and fluoroscopic guided placement of a 10 Fr percutaneous cholecystostomy tube, via a transhepatic approach. RECOMMENDATIONS: The patient will return to Vascular Interventional Radiology (VIR) for routine drainage catheter evaluation and exchange in 6 weeks. Roanna Banning, MD [facility] Radiology Electronically Signed   By: Roanna Banning M.D.   On: 05/28/2023 16:37   NM Hepatobiliary Liver Func Result Date: 05/27/2023 CLINICAL DATA:  Sepsis, cholecystitis on prior ultrasound and CT EXAM: NUCLEAR MEDICINE HEPATOBILIARY IMAGING TECHNIQUE: Sequential images of the abdomen were obtained out to 60 minutes following intravenous administration of radiopharmaceutical. RADIOPHARMACEUTICALS:  4.97 mCi Tc-49m  Choletec IV, 3 mg morphine IV COMPARISON:  None Available. FINDINGS: Physiologic uptake of radiotracer is seen throughout the hepatic parenchyma. Normal emptying through  the common bile duct into the small bowel. The gallbladder is not identified at 60 minutes. Morphine was administered, with no visualization of the gallbladder after 30 additional minutes of imaging. IMPRESSION: 1. Nonvisualization of the gallbladder despite morphine administration, consistent with acute cholecystitis. Electronically Signed   By: Sharlet Salina M.D.   On: 05/27/2023 18:16   CT ABDOMEN PELVIS W CONTRAST Result Date: 05/26/2023 CLINICAL DATA:  Sepsis . Pt was very agitated and fidgety through the entire exam. Best images attainable. EXAM: CT ABDOMEN AND PELVIS WITH CONTRAST TECHNIQUE: Multidetector CT imaging of the abdomen and pelvis was performed using the standard protocol following bolus administration of intravenous contrast. RADIATION DOSE REDUCTION: This exam was performed according to the departmental dose-optimization program which includes automated exposure control, adjustment of the mA and/or kV according to patient size and/or use of iterative reconstruction technique. CONTRAST:  OMNIPAQUE IOHEXOL 300 MG/ML  SOLN COMPARISON:  X-ray lumbar 03/19/2022, CT abdomen pelvis 11/10/2007 FINDINGS: Lower chest: Trace right pleural effusion. Hepatobiliary: Limited evaluation due to motion artifact with gallbladder wall thickening and pericholecystic fat stranding. Similar-appearing hypodensity of the left hepatic lobe likely a simple hepatic cyst (2:22). No CT evidence of gallstones. No pericholecystic fluid. No biliary dilatation. Pancreas: No focal lesion. Normal pancreatic contour. No surrounding inflammatory changes. No main pancreatic ductal dilatation. Spleen: Normal in size without focal abnormality. Adrenals/Urinary Tract: No adrenal nodule bilaterally. Bilateral kidneys enhance symmetrically. Parapelvic simple cysts of the left kidney. Simple renal cysts, in the absence of clinically indicated signs/symptoms, require no independent follow-up. No hydronephrosis. No hydroureter.  No  nephroureterolithiasis. The urinary bladder is unremarkable. Minimal excretion of intravenous contrast of either kidneys on delayed view. Stomach/Bowel: Stomach is within normal limits. No evidence of bowel wall thickening or dilatation. Third portion of the duodenum diverticula. Appendix appears normal. Vascular/Lymphatic: No abdominal aorta or iliac aneurysm. Severe atherosclerotic plaque of the aorta and its branches. No abdominal, pelvic, or inguinal lymphadenopathy. Reproductive: Prostate is unremarkable. Other: No intraperitoneal free fluid. No intraperitoneal free gas. No organized fluid collection. Musculoskeletal: No abdominal wall hernia or abnormality. No suspicious lytic or blastic osseous lesions. No acute displaced fracture. Chronic severe L1 compression fracture. Multilevel severe degenerative changes of the spine. Partially visualized left femur intramedullary nail fixation. IMPRESSION: 1. Gallbladder wall thickening and pericholecystic fat stranding. Question acute cholecystitis. Finding better evaluated on ultrasound abdomen 05/26/2023. 2. Trace right pleural effusion. 3. Third portion of the duodenum diverticula. Electronically Signed   By: Tish Frederickson M.D.   On: 05/26/2023 22:24   US Abdomen Limited RUQ (LIVER/GB) Result Date: 05/26/2023 CLINICAL DATA:  Fever.  Abnormal gallbladder on chest CT. EXAM: ULTRASOUND ABDOMEN LIMITED RIGHT UPPER QUADRANT COMPARISON:  CT chest from same day. CT abdomen pelvis dated November 10, 2007. FINDINGS: Gallbladder: Distended with sludge. Moderate wall thickening. No gallstones. No sonographic Murphy sign noted by sonographer. Common bile duct: Not visualized, but nondilated on earlier CT. Liver: No focal lesion identified. Within normal limits in parenchymal echogenicity. Portal vein is patent on color Doppler imaging with normal direction of blood flow towards the liver. Other: None. IMPRESSION: 1. Distended gallbladder with sludge and moderate wall  thickening. No gallstones. Findings are equivocal for acute cholecystitis. Consider further evaluation with nuclear medicine hepatobiliary scan. Electronically Signed   By: Obie Dredge M.D.   On: 05/26/2023 15:30  CT CHEST WO CONTRAST Result Date: 05/26/2023 CLINICAL DATA:  Shortness of breath. Cold symptoms for the past few weeks. EXAM: CT CHEST WITHOUT CONTRAST TECHNIQUE: Multidetector CT imaging of the chest was performed following the standard protocol without IV contrast. RADIATION DOSE REDUCTION: This exam was performed according to the departmental dose-optimization program which includes automated exposure control, adjustment of the mA and/or kV according to patient size and/or use of iterative reconstruction technique. COMPARISON:  Chest x-ray from same day. CT chest dated February 04, 2021. FINDINGS: Cardiovascular: No significant vascular findings. Normal heart size. No pericardial effusion. No thoracic aortic aneurysm. Coronary, aortic arch, and branch vessel atherosclerotic vascular disease. Mediastinum/Nodes: No enlarged mediastinal or axillary lymph nodes. Thyroid gland, trachea, and esophagus demonstrate no significant findings. Lungs/Pleura: Bibasilar subsegmental atelectasis. No focal consolidation, pleural effusion, or pneumothorax. Upper Abdomen: Distended gallbladder with mild wall thickening and surrounding inflammatory change. Musculoskeletal: No chest wall mass or suspicious bone lesions identified. IMPRESSION: 1. No acute intrathoracic process. 2. Distended gallbladder with mild wall thickening and surrounding inflammatory change, concerning for acute cholecystitis. Correlate with right upper quadrant pain and consider further evaluation with right upper quadrant ultrasound. 3.  Aortic Atherosclerosis (ICD10-I70.0). Electronically Signed   By: Obie Dredge M.D.   On: 05/26/2023 13:29   ECHOCARDIOGRAM COMPLETE Result Date: 05/26/2023    ECHOCARDIOGRAM REPORT   Patient Name:    TERRIAN SENTELL Kerlin Date of Exam: 05/26/2023 Medical Rec #:  [id number]         Height:       66.5 in Accession #:    [id number]        Weight:       205.5 lb Date of Birth:  [birth year]          BSA:          2.034 m Patient Age:    89 years          BP:           136/87 mmHg Patient Gender: M                 HR:           100 bpm. Exam Location:  Inpatient Procedure: 2D Echo, Cardiac Doppler and Color Doppler Indications:    Arrhythmia  History:        Patient has no prior history of Echocardiogram examinations.                 Risk Factors:Hypertension and Diabetes.  Sonographer:    Darlys Gales Referring Phys: [id number] MOHAMMAD L GARBA IMPRESSIONS  1. Left ventricular ejection fraction, by estimation, is 60 to 65%. The left ventricle has normal function. Left ventricular endocardial border not optimally defined to evaluate regional wall motion. Left ventricular diastolic parameters are consistent with Grade I diastolic dysfunction (impaired relaxation).  2. Right ventricular systolic function was not well visualized. The right ventricular size is normal.  3. Left atrial size was moderately dilated.  4. The mitral valve is grossly normal. No evidence of mitral valve regurgitation. No evidence of mitral stenosis.  5. The aortic valve was not well visualized. There is moderate calcification of the aortic valve. Aortic valve regurgitation is not visualized. Aortic valve sclerosis/calcification is present, without any evidence of aortic stenosis. Aortic valve area, by VTI measures 2.09 cm. Aortic valve mean gradient measures 11.0 mmHg. Aortic valve Vmax measures 1.92 m/s.  6. Aortic dilatation noted. There is mild dilatation of the aortic root, measuring 42 mm.  Normal size of ascending aorta. Comparison(s): No prior Echocardiogram. FINDINGS  Left Ventricle: Left ventricular ejection fraction, by estimation, is 60 to 65%. The left ventricle has normal function. Left ventricular endocardial border not optimally defined to  evaluate regional wall motion. The left ventricular internal cavity size was normal in size. There is no left ventricular hypertrophy. Left ventricular diastolic parameters are consistent with Grade I diastolic dysfunction (impaired relaxation). Normal left ventricular filling pressure. Right Ventricle: The right ventricular size is normal. No increase in right ventricular wall thickness. Right ventricular systolic function was not well visualized. Left Atrium: Left atrial size was moderately dilated. Right Atrium: Right atrial size was normal in size. Pericardium: There is no evidence of pericardial effusion. Mitral Valve: The mitral valve is grossly normal. No evidence of mitral valve regurgitation. No evidence of mitral valve stenosis. Tricuspid Valve: The tricuspid valve is not well visualized. Tricuspid valve regurgitation is trivial. No evidence of tricuspid stenosis. Aortic Valve: The aortic valve was not well visualized. There is moderate calcification of the aortic valve. Aortic valve regurgitation is not visualized. Aortic valve sclerosis/calcification is present, without any evidence of aortic stenosis. Aortic valve mean gradient measures 11.0 mmHg. Aortic valve peak gradient measures 14.7 mmHg. Aortic valve area, by VTI measures 2.09 cm. Pulmonic Valve: The pulmonic valve was not well visualized. Pulmonic valve regurgitation is trivial. No evidence of pulmonic stenosis. Aorta: Aortic dilatation noted. There is mild dilatation of the aortic root, measuring 42 mm. Venous: The inferior vena cava was not well visualized. IAS/Shunts: The interatrial septum was not well visualized.  LEFT VENTRICLE PLAX 2D LVIDd:         4.90 cm   Diastology LVIDs:         3.60 cm   LV e' medial:    8.49 cm/s LV PW:         1.00 cm   LV E/e' medial:  9.8 LV IVS:        1.00 cm   LV e' lateral:   10.10 cm/s LVOT diam:     2.00 cm   LV E/e' lateral: 8.3 LV SV:         74 LV SV Index:   37 LVOT Area:     3.14 cm  RIGHT VENTRICLE  RV S prime:     23.00 cm/s TAPSE (M-mode): 2.8 cm LEFT ATRIUM             Index LA Vol (A2C):   45.8 ml 22.51 ml/m LA Vol (A4C):   88.1 ml 43.31 ml/m LA Biplane Vol: 64.3 ml 31.61 ml/m  AORTIC VALVE AV Area (Vmax):    1.85 cm AV Area (Vmean):   1.73 cm AV Area (VTI):     2.09 cm AV Vmax:           192.00 cm/s AV Vmean:          164.000 cm/s AV VTI:            0.356 m AV Peak Grad:      14.7 mmHg AV Mean Grad:      11.0 mmHg LVOT Vmax:         113.00 cm/s LVOT Vmean:        90.400 cm/s LVOT VTI:          0.237 m LVOT/AV VTI ratio: 0.67  AORTA Ao Root diam: 4.20 cm MITRAL VALVE               TRICUSPID VALVE MV  Area (PHT): 3.27 cm    TR Peak grad:   35.0 mmHg MV Decel Time: 232 msec    TR Vmax:        296.00 cm/s MV E velocity: 83.60 cm/s MV A velocity: 84.00 cm/s  SHUNTS MV E/A ratio:  1.00        Systemic VTI:  0.24 m                            Systemic Diam: 2.00 cm Vishnu Priya Mallipeddi Electronically signed by Winfield Rast Mallipeddi Signature Date/Time: 05/26/2023/11:18:32 AM    Final    DG Chest Portable 1 View Result Date: 05/26/2023 CLINICAL DATA:  Increased heart rate and shortness of breath. EXAM: PORTABLE CHEST 1 VIEW COMPARISON:  May 25, 2023 FINDINGS: Multiple sternal wires are noted. Cardiac silhouette is mildly enlarged and unchanged in size. Low lung volumes are seen. Mild atelectasis is present within the bilateral lung bases. No pleural effusion or pneumothorax is identified. Multilevel degenerative changes are seen throughout the thoracic spine. IMPRESSION: 1. Evidence of prior median sternotomy. 2. Low lung volumes with mild bibasilar atelectasis. Electronically Signed   By: Aram Candela M.D.   On: 05/26/2023 01:14   DG Chest Portable 1 View Result Date: 05/25/2023 CLINICAL DATA:  Cough, sepsis EXAM: PORTABLE CHEST 1 VIEW COMPARISON:  07/18/2021 FINDINGS: Single frontal view of the chest demonstrates stable postsurgical changes from median sternotomy. The cardiac silhouette is  enlarged. Lung volumes are diminished, with crowding of the central vasculature. No airspace disease, effusion, or pneumothorax. IMPRESSION: 1. Low lung volumes.  No acute airspace disease. Electronically Signed   By: Sharlet Salina M.D.   On: 05/25/2023 21:52     Time coordinating discharge: Over 30 minutes    Lewie Chamber, MD  [facility] 06/03/2023, 10:17 AM

## 2023-06-03 NOTE — Progress Notes (Signed)
 PT Cancellation Note  Patient Details Name: Jonathan Owen MRN: [id number] DOB: 07-22-1927   Cancelled Treatment:    Reason Eval/Treat Not Completed: Fatigue/lethargy limiting ability to participate RN reports that patient has been  up x 2 this AM and just returned to bed. Will check back another time. Blanchard Kelch PT Acute Rehabilitation Services Office [phone]   Rada Hay 06/03/2023, 9:43 AM

## 2023-06-03 NOTE — TOC Transition Note (Signed)
 Transition of Care [facility]) - Discharge Note   Patient Details  Name: Jonathan Owen MRN: [id number] Date of Birth: [birth year]  Transition of Care [facility]) CM/SW Contact:  Larrie Kass, LCSW Phone Number: 06/03/2023, 11:26 AM   Clinical Narrative:    Pt to d/c to [facility] room 504A , RN to call report to [phone]. CSW spoke with pt's daughter who agrees with d/c plan. PTAR called no further TOC needs, TOC sign off.    Final next level of care: Skilled Nursing Facility Barriers to Discharge: Barriers Resolved   Patient Goals and CMS Choice Patient states their goals for this hospitalization and ongoing recovery are:: SNf to get stronger CMS Medicare.gov Compare Post Acute Care list provided to:: Patient Choice offered to / list presented to : Adult Children, Patient      Discharge Placement              Patient chooses bed at: [facility] Patient to be transferred to facility by: EMS Name of family member notified: Rolston,Lisa (Daughter)  [phone] (Mobile) Patient and family notified of of transfer: 06/03/23  Discharge Plan and Services Additional resources added to the After Visit Summary for   In-house Referral: Clinical Social Work   Post Acute Care Choice: Skilled Nursing Facility          DME Arranged: N/A DME Agency: NA                  Social Drivers of Health (SDOH) Interventions SDOH Screenings   Food Insecurity: No Food Insecurity (05/26/2023)  Housing: Low Risk  (05/26/2023)  Transportation Needs: No Transportation Needs (05/26/2023)  Utilities: Not At Risk (05/26/2023)  Social Connections: Unknown (05/26/2023)  Tobacco Use: Low Risk  (05/25/2023)     Readmission Risk Interventions     No data to display

## 2023-06-03 NOTE — Progress Notes (Signed)
 AVS given to patient and explained at the bedside and over the phone with receiving nurse at [facility]. Medications and follow up appointments have been explained with pt and the pt's nurse verbalizing understanding.

## 2023-06-03 NOTE — Progress Notes (Signed)
 Referring Provider(s): Leary Roca   Supervising Physician: Oley Balm  Patient Status:  [facility] - In-pt  Chief Complaint:  Acute cholecystitis - s/p percutaneous cholecystostomy tube placement 05/28/23 Dr. Milford Cage   Subjective:  Pt is sitting up in chair, states he is feeling well today, daughter and nurse at bedside   Allergies: Tape  Medications: Prior to Admission medications   Medication Sig Start Date End Date Taking? Authorizing Provider  cholecalciferol (VITAMIN D3) 25 MCG (1000 UNIT) tablet Take 3,000 Units by mouth daily.   Yes [provider]  clonazePAM (KLONOPIN) 0.5 MG tablet Take 0.5 mg by mouth 2 (two) times daily as needed.   Yes [provider]  gabapentin (NEURONTIN) 300 MG capsule Take 300 mg by mouth at bedtime.   Yes [provider]  Melatonin 10 MG TABS Take 20 mg by mouth at bedtime as needed (Sleep).   Yes [provider]  tamsulosin (FLOMAX) 0.4 MG CAPS capsule Take 0.4 mg by mouth daily.   Yes [provider]  TOUJEO SOLOSTAR 300 UNIT/ML Solostar Pen Inject 22 Units into the skin in the morning.   Yes [provider]  amoxicillin (AMOXIL) 875 MG tablet Take 875 mg by mouth 2 (two) times daily. Patient not taking: Reported on 05/26/2023 04/22/23   [provider]  aspirin 81 MG tablet Take 81 mg by mouth daily. Patient not taking: Reported on 05/26/2023    [provider]  baclofen (LIORESAL) 10 MG tablet TAKE 1 TABLET BY MOUTH THREE TIMES A DAY Patient not taking: Reported on 05/26/2023 11/05/22   Juanda Chance, NP  Insulin Aspart FlexPen (NOVOLOG) 100 UNIT/ML USE AS DIRECTED. MAX DAILY DOSE IS 50 UNITS. Patient not taking: Reported on 05/25/2023 08/01/20   [provider]  LORazepam (ATIVAN) 1 MG tablet Take 0.5 mg by mouth at bedtime as needed.  Patient not taking: Reported on 05/26/2023    [provider]  traMADol (ULTRAM) 50 MG tablet Take 1 tablet (50 mg  total) by mouth every 8 (eight) hours as needed for moderate pain or severe pain. Patient not taking: Reported on 05/26/2023 09/24/22 09/24/23  Juanda Chance, NP  zolpidem (AMBIEN) 5 MG tablet Take 5 mg by mouth at bedtime as needed. Patient not taking: Reported on 05/26/2023 08/16/22   [provider]     Vital Signs: BP (!) 140/60 (BP Location: Left Arm)   Pulse 88   Temp 98.3 F (36.8 C) (Oral)   Resp 19   Ht 5\' 11"  (1.803 m)   Wt 215 lb 2.7 oz (97.6 kg)   SpO2 97%   BMI 30.01 kg/m   Physical Exam Constitutional:      Appearance: Normal appearance.  Skin:    Comments: Percutaneous cholecystostomy tube drain F/A easily  Skin clean, dry, no redness, no drainage, no signs of infection  Output clear brown fluid in gravity bag, ~75 mL   Neurological:     Mental Status: He is alert and oriented to person, place, and time.  Psychiatric:        Mood and Affect: Mood normal.        Behavior: Behavior normal.      Labs:  CBC: Recent Labs    05/30/23 0733 05/31/23 0629 06/01/23 0406 06/03/23 0402  WBC 10.5 11.1* 11.7* 12.0*  HGB 11.1* 11.6* 11.2* 11.3*  HCT 34.6* 37.2* 35.5* 36.7*  PLT 273 350 374 411*    COAGS: Recent Labs  05/25/23 2055 05/26/23 0500  INR 1.2 1.2  APTT 25  --     BMP: Recent Labs    05/30/23 0733 05/31/23 0629 06/01/23 0406 06/02/23 0720 06/03/23 0402  NA 133* 133* 134*  --  130*  K 3.9 4.0 4.2  --  4.2  CL 103 100 102  --  98  CO2 22 23 25   --  23  GLUCOSE 207* 203* 170*  --  185*  BUN 17 20 16   --  12  CALCIUM 7.6* 8.2* 8.0*  --  8.3*  CREATININE 0.62 0.59* 0.49* 0.55* 0.48*  GFRNONAA >60 >60 >60 >60 >60    LIVER FUNCTION TESTS: Recent Labs    05/26/23 0500 05/27/23 0512 05/28/23 0330 05/29/23 0306  BILITOT 1.8* 1.1 0.9 1.1  AST 17 50* 35 23  ALT 12 25 27 23   ALKPHOS 54 55 59 53  PROT 5.8* 5.5* 5.3* 5.1*  ALBUMIN 2.6* 2.4* 2.2* 2.1*    Assessment and Plan:  Drain Location: Cholecystostomy tube  Size:  Fr size: 10 Fr Date of placement: 05/28/23  Currently to: Drain collection device: gravity 24 hour output:  Output by Drain (mL) 06/01/23 0701 - 06/01/23 1900 06/01/23 1901 - 06/02/23 0700 06/02/23 0701 - 06/02/23 1900 06/02/23 1901 - 06/03/23 0700 06/03/23 0701 - 06/03/23 1020  Biliary Tube Cholecystostomy 10 Fr. RUQ 125 175 200 135     Interval imaging/drain manipulation:  None  Current examination: Flushes/aspirates easily.  Insertion site unremarkable. Suture and stat lock in place. Dressed appropriately.   Plan: Continue TID flushes with 5 cc NS. Record output Q shift. Dressing changes QD or PRN if soiled.  Call IR APP or on call IR MD if difficulty flushing or sudden change in drain output.  Repeat imaging/possible drain injection once output < 10 mL/QD (excluding flush material). Consideration for drain removal if output is < 10 mL/QD (excluding flush material), pending discussion with the providing surgical service.  Discharge planning: Please contact IR APP or on call IR MD prior to patient d/c to ensure appropriate follow up plans are in place. Typically patient will follow up with IR clinic 10-14 days post d/c for repeat imaging/possible drain injection. IR scheduler will contact patient with date/time of appointment. Patient will need to flush drain QD with 5 cc NS, record output QD, dressing changes every 2-3 days or earlier if soiled.   S/p percutaneous cholecystostomy tube placement 05/28/23 Dr. Milford Cage.    IR will continue to follow - please call with questions or concerns.   Electronically Signed: Loman Brooklyn, PA-C 06/03/2023, 10:15 AM    I spent a total of 15 Minutes at the the patient's bedside AND on the patient's hospital floor or unit, greater than 50% of which was counseling/coordinating care for percutaneous cholecystostomy tube placement.

## 2023-06-03 NOTE — Care Management Important Message (Signed)
 Important Message  Patient Details IM Letter given. Name: Jonathan Owen MRN: [id number] Date of Birth: [birth year]   Important Message Given:  Yes - Medicare IM     Caren Macadam 06/03/2023, 11:46 AM

## 2023-06-13 ENCOUNTER — Encounter (HOSPITAL_COMMUNITY): Payer: Self-pay | Admitting: Internal Medicine

## 2023-06-13 ENCOUNTER — Ambulatory Visit (HOSPITAL_COMMUNITY)
Admit: 2023-06-13 | Discharge: 2023-06-13 | Disposition: A | Discharge status: Home / Self Care | Payer: Medicare Other | Source: Ambulatory Visit | Attending: Internal Medicine | Admitting: Internal Medicine

## 2023-06-13 VITALS — BP 144/72 | HR 101 | Ht 71.0 in | Wt 199.0 lb

## 2023-06-13 DIAGNOSIS — M1990 Unspecified osteoarthritis, unspecified site: Secondary | ICD-10-CM | POA: Insufficient documentation

## 2023-06-13 DIAGNOSIS — Z85038 Personal history of other malignant neoplasm of large intestine: Secondary | ICD-10-CM | POA: Insufficient documentation

## 2023-06-13 DIAGNOSIS — I480 Paroxysmal atrial fibrillation: Secondary | ICD-10-CM | POA: Diagnosis not present

## 2023-06-13 DIAGNOSIS — I10 Essential (primary) hypertension: Secondary | ICD-10-CM | POA: Insufficient documentation

## 2023-06-13 DIAGNOSIS — Z794 Long term (current) use of insulin: Secondary | ICD-10-CM | POA: Diagnosis not present

## 2023-06-13 DIAGNOSIS — I4891 Unspecified atrial fibrillation: Secondary | ICD-10-CM | POA: Diagnosis present

## 2023-06-13 DIAGNOSIS — Z7985 Long-term (current) use of injectable non-insulin antidiabetic drugs: Secondary | ICD-10-CM | POA: Diagnosis not present

## 2023-06-13 DIAGNOSIS — Z7901 Long term (current) use of anticoagulants: Secondary | ICD-10-CM | POA: Insufficient documentation

## 2023-06-13 DIAGNOSIS — D6869 Other thrombophilia: Secondary | ICD-10-CM | POA: Diagnosis not present

## 2023-06-13 DIAGNOSIS — R600 Localized edema: Secondary | ICD-10-CM | POA: Diagnosis present

## 2023-06-13 DIAGNOSIS — E1151 Type 2 diabetes mellitus with diabetic peripheral angiopathy without gangrene: Secondary | ICD-10-CM | POA: Insufficient documentation

## 2023-06-13 HISTORY — DX: Paroxysmal atrial fibrillation: I48.0

## 2023-06-13 LAB — BASIC METABOLIC PANEL
Anion gap: 8 (ref 5–15)
BUN: 8 mg/dL (ref 8–23)
CO2: 23 mmol/L (ref 22–32)
Calcium: 8.4 mg/dL — ABNORMAL LOW (ref 8.9–10.3)
Chloride: 97 mmol/L — ABNORMAL LOW (ref 98–111)
Creatinine, Ser: 0.6 mg/dL — ABNORMAL LOW (ref 0.61–1.24)
GFR, Estimated: >60 mL/min (ref >60)
Glucose, Bld: 164 mg/dL — ABNORMAL HIGH (ref 70–99)
Potassium: 4.5 mmol/L (ref 3.5–5.1)
Sodium: 128 mmol/L — ABNORMAL LOW (ref 135–145)

## 2023-06-13 LAB — BRAIN NATRIURETIC PEPTIDE: B Natriuretic Peptide: 52.3 pg/mL (ref 0.0–100.0)

## 2023-06-13 MED ORDER — FUROSEMIDE 20 MG PO TABS: ORAL_TABLET | ORAL | 1 refills | Status: DC

## 2023-06-13 NOTE — Patient Instructions (Addendum)
 Start Lasix 20mg  once a day for the next 3 days then only as needed for weight gain/swelling

## 2023-06-13 NOTE — Progress Notes (Addendum)
 Primary Care Physician: Noni Saupe, MD Primary Cardiologist: None Electrophysiologist: None     Referring Physician: Dr. [name] is a 89 y.o. 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 Hospital admission 2/8-17/25 for sepsis likely due to acute cholecystitis also noted to be in Afib with RVR. He was still in Afib at time of discharge. Discharged on diltiazem 180 mg daily. Patient is on Eliquis 5 mg BID for a CHADS2VASC score of 5.  On evaluation today, he is currently in NSR. Daughter with patient today notes he has bilateral ankle swelling that is ongoing. She notes he had it while in hospital but lasix that was supposed to be prescribed was never sent home with him. Daughter notes that at time of discharge she was told he was back in normal rhythm. He is currently at [facility] facility for rehab and will be going home on Tuesday, 3/4. He has been compliant with diltiazem and Eliquis. Daughter of patient has concerns that possibly the diltiazem and/or Eliquis are making him tired.   Today, he denies symptoms of palpitations, chest pain, shortness of breath, orthopnea, PND, lower extremity edema, dizziness, presyncope, syncope, snoring, daytime somnolence, bleeding, or neurologic sequela. The patient is tolerating medications without difficulties and is otherwise without complaint today.    he has a BMI of Body mass index is 27.75 kg/m.Marland Kitchen Filed Weights   06/13/23 1037  Weight: 90.3 kg    Current Outpatient Medications  Medication Sig Dispense Refill   acetaminophen (TYLENOL) 325 MG tablet Take 2 tablets (650 mg total) by mouth every 4 (four) hours as needed for mild pain (pain score 1-3), moderate pain (pain score 4-6), fever or headache (or Fever >/= 101).     apixaban (ELIQUIS) 5 MG TABS  tablet Take 1 tablet (5 mg total) by mouth 2 (two) times daily.     cholecalciferol (VITAMIN D3) 25 MCG (1000 UNIT) tablet Take 3,000 Units by mouth daily.     clonazePAM (KLONOPIN) 0.5 MG tablet Take 1 tablet (0.5 mg total) by mouth daily as needed. 10 tablet 0   diltiazem (CARDIZEM CD) 180 MG 24 hr capsule Take 1 capsule (180 mg total) by mouth daily.     furosemide (LASIX) 20 MG tablet Take 1 tablet by mouth daily for the next 3 days then only as needed for weight gain/swelling 30 tablet 1   gabapentin (NEURONTIN) 300 MG capsule Take 1 capsule (300 mg total) by mouth at bedtime. 10 capsule 0   insulin aspart (NOVOLOG) 100 UNIT/ML injection Inject 1-6 Units into the skin 4 (four) times daily -  before meals and at bedtime. CBG 151 - 200: 1 unit, CBG 201-250: 2 units, CBG 251-300: 3 units, CBG 301-350: 4 units, CBG 351-400: 5 units, CBG > 400: Give 6 units     Melatonin 10 MG TABS Take 20 mg by mouth at bedtime as needed (Sleep).     ondansetron (ZOFRAN) 4 MG tablet Take 1 tablet (4 mg total) by mouth every 6 (six) hours as needed for nausea or vomiting.     polyethylene glycol (MIRALAX / GLYCOLAX) 17 g packet Take 17 g by mouth daily as needed.     senna-docusate (SENOKOT-S) 8.6-50 MG tablet Take 1 tablet by mouth 2 (two) times daily.     tamsulosin (FLOMAX) 0.4  MG CAPS capsule Take 0.4 mg by mouth daily.     TOUJEO SOLOSTAR 300 UNIT/ML Solostar Pen Inject 10 Units into the skin in the morning.     No current facility-administered medications for this encounter.    Atrial Fibrillation Management history:  Previous antiarrhythmic drugs: none Previous cardioversions: none Previous ablations: none Anticoagulation history: Eliquis 5 mg BID   ROS- All systems are reviewed and negative except as per the HPI above.  Physical Exam: BP (!) 144/72   Pulse (!) 101   Ht 5\' 11"  (1.803 m)   Wt 90.3 kg   BMI 27.75 kg/m   GEN: Well nourished, well developed in no acute distress. NECK: No JVD;  No carotid bruits CARDIAC: Regular tachycardic rate and rhythm, no murmurs, rubs, gallops RESPIRATORY:  Bilateral crackles lower bases ABDOMEN: Soft, non-tender, non-distended EXTREMITIES: 2+ right pedal edema, 1+ left pedal edema  EKG today demonstrates  Vent. rate 101 BPM PR interval 174 ms QRS duration 84 ms QT/QTcB 332/430 ms P-R-T axes 36 18 42 Sinus tachycardia Possible Anterior infarct , age undetermined Abnormal ECG When compared with ECG of 26-May-2023 08:49, PREVIOUS ECG IS PRESENT  Echo 05/26/23 demonstrated   1. Left ventricular ejection fraction, by estimation, is 60 to 65%. The  left ventricle has normal function. Left ventricular endocardial border  not optimally defined to evaluate regional wall motion. Left ventricular  diastolic parameters are consistent  with Grade I diastolic dysfunction (impaired relaxation).   2. Right ventricular systolic function was not well visualized. The right  ventricular size is normal.   3. Left atrial size was moderately dilated.   4. The mitral valve is grossly normal. No evidence of mitral valve  regurgitation. No evidence of mitral stenosis.   5. The aortic valve was not well visualized. There is moderate  calcification of the aortic valve. Aortic valve regurgitation is not  visualized. Aortic valve sclerosis/calcification is present, without any  evidence of aortic stenosis. Aortic valve area,  by VTI measures 2.09 cm. Aortic valve mean gradient measures 11.0 mmHg.  Aortic valve Vmax measures 1.92 m/s.   6. Aortic dilatation noted. There is mild dilatation of the aortic root,  measuring 42 mm. Normal size of ascending aorta.    ASSESSMENT & PLAN CHA2DS2-VASc Score = 5  The patient's score is based upon: CHF History: 0 HTN History: 1 Diabetes History: 1 Stroke History: 0 Vascular Disease History: 1 Age Score: 2 Gender Score: 0       ASSESSMENT AND PLAN: Paroxysmal Atrial Fibrillation (ICD10:  I48.19) The  patient's CHA2DS2-VASc score is 5, indicating a 7.2% annual risk of stroke.    He is currently in NSR. Patient on exam today is coughing throughout office visit, has crackles on exam, noted tachycardiac, and bilateral pedal edema. He appears to be fluid overloaded; daughter notes he was supposed to be prescribed lasix at discharge but this was not done. Will begin lasix 20 mg once daily x 3 days. Advised to trend daily weights. Reassess next week. Draw Bmet and BNP today. Given strict ED precautions if patient develops worsening SOB and/or chest pain. Continue diltiazem 180 mg daily. Encouraged daughter for patient to have asap hospital follow up with primary care physician to reassess early next week if possible.   Secondary Hypercoagulable State (ICD10:  D68.69) The patient is at significant risk for stroke/thromboembolism based upon his CHA2DS2-VASc Score of 5.  Continue Apixaban (Eliquis).  Continue without interruption.    Follow up 1 week for  reassessment.   Lake Bells, PA-C  Afib Clinic [facility] [address] [place], [place] [address] [phone]

## 2023-06-17 ENCOUNTER — Other Ambulatory Visit (HOSPITAL_COMMUNITY): Payer: Self-pay | Admitting: *Deleted

## 2023-06-17 MED ORDER — APIXABAN 5 MG PO TABS: 5.0000 mg | ORAL_TABLET | Freq: Two times a day (BID) | ORAL | 3 refills | Status: DC

## 2023-06-17 MED ORDER — DILTIAZEM HCL ER COATED BEADS 180 MG PO CP24: 180.0000 mg | ORAL_CAPSULE | Freq: Every day | ORAL | 3 refills | Status: DC

## 2023-06-21 ENCOUNTER — Ambulatory Visit (HOSPITAL_COMMUNITY)
Admission: RE | Admit: 2023-06-21 | Discharge: 2023-06-21 | Disposition: A | Discharge status: Home / Self Care | Payer: Medicare Other | Source: Ambulatory Visit | Attending: Internal Medicine | Admitting: Internal Medicine

## 2023-06-21 ENCOUNTER — Encounter (HOSPITAL_COMMUNITY): Payer: Self-pay | Admitting: Internal Medicine

## 2023-06-21 VITALS — BP 126/84 | HR 91 | Ht 71.0 in | Wt 190.0 lb

## 2023-06-21 DIAGNOSIS — I480 Paroxysmal atrial fibrillation: Secondary | ICD-10-CM | POA: Insufficient documentation

## 2023-06-21 DIAGNOSIS — I358 Other nonrheumatic aortic valve disorders: Secondary | ICD-10-CM | POA: Diagnosis not present

## 2023-06-21 DIAGNOSIS — Z79899 Other long term (current) drug therapy: Secondary | ICD-10-CM | POA: Diagnosis not present

## 2023-06-21 DIAGNOSIS — E119 Type 2 diabetes mellitus without complications: Secondary | ICD-10-CM | POA: Insufficient documentation

## 2023-06-21 DIAGNOSIS — Z794 Long term (current) use of insulin: Secondary | ICD-10-CM | POA: Insufficient documentation

## 2023-06-21 DIAGNOSIS — Z7901 Long term (current) use of anticoagulants: Secondary | ICD-10-CM | POA: Diagnosis not present

## 2023-06-21 DIAGNOSIS — I10 Essential (primary) hypertension: Secondary | ICD-10-CM | POA: Diagnosis not present

## 2023-06-21 DIAGNOSIS — D6869 Other thrombophilia: Secondary | ICD-10-CM | POA: Insufficient documentation

## 2023-06-21 LAB — CBC
HCT: 38.2 % — ABNORMAL LOW (ref 39.0–52.0)
Hemoglobin: 11.9 g/dL — ABNORMAL LOW (ref 13.0–17.0)
MCH: 28.1 pg (ref 26.0–34.0)
MCHC: 31.2 g/dL (ref 30.0–36.0)
MCV: 90.1 fL (ref 80.0–100.0)
Platelets: 378 10*3/uL (ref 150–400)
RBC: 4.24 MIL/uL (ref 4.22–5.81)
RDW: 14.3 % (ref 11.5–15.5)
WBC: 8.7 10*3/uL (ref 4.0–10.5)
nRBC: 0 % (ref 0.0–0.2)

## 2023-06-21 LAB — BASIC METABOLIC PANEL
Anion gap: 10 (ref 5–15)
BUN: 10 mg/dL (ref 8–23)
CO2: 21 mmol/L — ABNORMAL LOW (ref 22–32)
Calcium: 8.7 mg/dL — ABNORMAL LOW (ref 8.9–10.3)
Chloride: 102 mmol/L (ref 98–111)
Creatinine, Ser: 0.77 mg/dL (ref 0.61–1.24)
GFR, Estimated: >60 mL/min (ref >60)
Glucose, Bld: 122 mg/dL — ABNORMAL HIGH (ref 70–99)
Potassium: 4.3 mmol/L (ref 3.5–5.1)
Sodium: 133 mmol/L — ABNORMAL LOW (ref 135–145)

## 2023-06-21 NOTE — Patient Instructions (Signed)
 Kardia Mobile (1 lead)  Follow up 1 year

## 2023-06-21 NOTE — Progress Notes (Signed)
 Primary Care Physician: Noni Saupe, MD Primary Cardiologist: None Electrophysiologist: None     Referring Physician: Dr. Hansel Feinstein Chmiel is a 89 y.o. 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 Hospital admission 2/8-17/25 for sepsis likely due to acute cholecystitis also noted to be in Afib with RVR. He was still in Afib at time of discharge. Discharged on diltiazem 180 mg daily. Patient is on Eliquis 5 mg BID for a CHADS2VASC score of 5.  On evaluation today, he is currently in NSR. Daughter with patient today notes he has bilateral ankle swelling that is ongoing. She notes he had it while in hospital but lasix that was supposed to be prescribed was never sent home with him. Daughter notes that at time of discharge she was told he was back in normal rhythm. He is currently at [facility] facility for rehab and will be going home on Tuesday, 3/4. He has been compliant with diltiazem and Eliquis. Daughter of patient has concerns that possibly the diltiazem and/or Eliquis are making him tired.   On follow up 06/21/23, he is currently in NSR. He appeared fluid overloaded at last office visit and began lasix 20 mg x 3 days. BNP drawn at last office visit was 52. He notes improvement with taking lasix course; his weight is 4 kg less compared to last visit. He feels well and is now residing with his daughter at home. No bleeding issues on Eliquis 5 mg BID.   Today, he denies symptoms of palpitations, chest pain, shortness of breath, orthopnea, PND, lower extremity edema, dizziness, presyncope, syncope, snoring, daytime somnolence, bleeding, or neurologic sequela. The patient is tolerating medications without difficulties and is otherwise without complaint today.    he has a BMI of Body mass index is 26.5 kg/m.Marland Kitchen Filed  Weights   06/21/23 1105  Weight: 86.2 kg     Current Outpatient Medications  Medication Sig Dispense Refill   acetaminophen (TYLENOL) 325 MG tablet Take 2 tablets (650 mg total) by mouth every 4 (four) hours as needed for mild pain (pain score 1-3), moderate pain (pain score 4-6), fever or headache (or Fever >/= 101).     apixaban (ELIQUIS) 5 MG TABS tablet Take 1 tablet (5 mg total) by mouth 2 (two) times daily. 60 tablet 3   cholecalciferol (VITAMIN D3) 25 MCG (1000 UNIT) tablet Take 3,000 Units by mouth daily.     clonazePAM (KLONOPIN) 0.5 MG tablet Take 1 tablet (0.5 mg total) by mouth daily as needed. 10 tablet 0   diltiazem (CARDIZEM CD) 180 MG 24 hr capsule Take 1 capsule (180 mg total) by mouth daily. 30 capsule 3   furosemide (LASIX) 20 MG tablet Take 1 tablet by mouth daily for the next 3 days then only as needed for weight gain/swelling 30 tablet 1   gabapentin (NEURONTIN) 300 MG capsule Take 1 capsule (300 mg total) by mouth at bedtime. 10 capsule 0   insulin aspart (NOVOLOG) 100 UNIT/ML injection Inject 1-6 Units into the skin 4 (four) times daily -  before meals and at bedtime. CBG 151 - 200: 1 unit, CBG 201-250: 2 units, CBG 251-300: 3 units, CBG 301-350: 4 units, CBG 351-400: 5 units, CBG > 400: Give 6 units     Melatonin 10 MG TABS Take 20 mg by mouth at bedtime as needed (  Sleep).     ondansetron (ZOFRAN) 4 MG tablet Take 1 tablet (4 mg total) by mouth every 6 (six) hours as needed for nausea or vomiting.     polyethylene glycol (MIRALAX / GLYCOLAX) 17 g packet Take 17 g by mouth daily as needed.     senna-docusate (SENOKOT-S) 8.6-50 MG tablet Take 1 tablet by mouth 2 (two) times daily.     tamsulosin (FLOMAX) 0.4 MG CAPS capsule Take 0.4 mg by mouth daily.     TOUJEO SOLOSTAR 300 UNIT/ML Solostar Pen Inject 10 Units into the skin in the morning.     No current facility-administered medications for this encounter.    Atrial Fibrillation Management history:  Previous  antiarrhythmic drugs: none Previous cardioversions: none Previous ablations: none Anticoagulation history: Eliquis 5 mg BID   ROS- All systems are reviewed and negative except as per the HPI above.  Physical Exam: BP 126/84   Pulse 91   Ht 5\' 11"  (1.803 m)   Wt 86.2 kg   BMI 26.50 kg/m   GEN- The patient is well appearing, alert and oriented x 3 today.   Neck - no JVD or carotid bruit noted Lungs- Slight crackles noted left lower lobe, overall bilateral clear to ausculation bilaterally, normal work of breathing, much improved compared to previous Heart- Regular rate and rhythm, no murmurs, rubs or gallops, PMI not laterally displaced Extremities- no clubbing, cyanosis. No edema bilateral ankles; improved compared to previous Skin - no rash or ecchymosis noted   EKG today demonstrates  Vent. rate 91 BPM PR interval 204 ms QRS duration 78 ms QT/QTcB 354/435 ms P-R-T axes 47 -1 10 Normal sinus rhythm Minimal voltage criteria for LVH, may be normal variant ( R in aVL ) Borderline ECG When compared with ECG of 13-Jun-2023 10:41, PREVIOUS ECG IS PRESENT  Echo 05/26/23 demonstrated   1. Left ventricular ejection fraction, by estimation, is 60 to 65%. The  left ventricle has normal function. Left ventricular endocardial border  not optimally defined to evaluate regional wall motion. Left ventricular  diastolic parameters are consistent  with Grade I diastolic dysfunction (impaired relaxation).   2. Right ventricular systolic function was not well visualized. The right  ventricular size is normal.   3. Left atrial size was moderately dilated.   4. The mitral valve is grossly normal. No evidence of mitral valve  regurgitation. No evidence of mitral stenosis.   5. The aortic valve was not well visualized. There is moderate  calcification of the aortic valve. Aortic valve regurgitation is not  visualized. Aortic valve sclerosis/calcification is present, without any  evidence of  aortic stenosis. Aortic valve area,  by VTI measures 2.09 cm. Aortic valve mean gradient measures 11.0 mmHg.  Aortic valve Vmax measures 1.92 m/s.   6. Aortic dilatation noted. There is mild dilatation of the aortic root,  measuring 42 mm. Normal size of ascending aorta.    ASSESSMENT & PLAN CHA2DS2-VASc Score = 5  The patient's score is based upon: CHF History: 0 HTN History: 1 Diabetes History: 1 Stroke History: 0 Vascular Disease History: 1 Age Score: 2 Gender Score: 0      ASSESSMENT AND PLAN: Paroxysmal Atrial Fibrillation (ICD10:  I48.19) The patient's CHA2DS2-VASc score is 5, indicating a 7.2% annual risk of stroke.    He is currently in NSR. He is feeling well overall and there is significant improvement post lasix course. Bmet drawn today. Daughter notes his urine output has been low even while in the  hospital. Recommended to discuss this with PCP. At this time will continue diltiazem 180 mg daily. Recommended rhythm monitoring device for home use.   Daughter of patient would like for him to establish with cardiology which is reasonable. Will help with referral.   Secondary Hypercoagulable State (ICD10:  D68.69) The patient is at significant risk for stroke/thromboembolism based upon his CHA2DS2-VASc Score of 5.  Continue Apixaban (Eliquis).  Continue Eliquis 5 mg BID without interruption. CBC drawn today.     Follow up Afib clinic prn.    Lake Bells, PA-C  Afib Clinic [facility] [address] [place], [place] [address] [phone]

## 2023-07-03 ENCOUNTER — Other Ambulatory Visit (HOSPITAL_COMMUNITY): Payer: Self-pay | Admitting: Interventional Radiology

## 2023-07-03 ENCOUNTER — Ambulatory Visit (HOSPITAL_COMMUNITY)
Admission: RE | Admit: 2023-07-03 | Discharge: 2023-07-03 | Disposition: A | Discharge status: Home / Self Care | Source: Ambulatory Visit | Attending: Radiology | Admitting: Radiology

## 2023-07-03 DIAGNOSIS — K810 Acute cholecystitis: Secondary | ICD-10-CM | POA: Diagnosis not present

## 2023-07-03 DIAGNOSIS — Z434 Encounter for attention to other artificial openings of digestive tract: Secondary | ICD-10-CM | POA: Insufficient documentation

## 2023-07-03 HISTORY — PX: IR EXCHANGE BILIARY DRAIN: IMG6046

## 2023-07-03 MED ORDER — LIDOCAINE HCL 1 % IJ SOLN
INTRAMUSCULAR | Status: AC
  Filled 2023-07-03: qty 20

## 2023-07-03 MED ORDER — IOHEXOL 300 MG/ML  SOLN
50.0000 mL | Freq: Once | INTRAMUSCULAR | Status: AC | PRN
  Administered 2023-07-03: 15 mL

## 2023-07-03 MED ORDER — LIDOCAINE HCL 1 % IJ SOLN
20.0000 mL | Freq: Once | INTRAMUSCULAR | Status: AC
  Administered 2023-07-03: 10 mL via INTRADERMAL

## 2023-07-05 ENCOUNTER — Telehealth (HOSPITAL_COMMUNITY): Payer: Self-pay

## 2023-07-05 NOTE — Telephone Encounter (Signed)
 Patient's daughter called in regarding her dad. His daughter is out of town. He has been feeling weak. His HR is in the 70's and B/P is 119/59. He is not currently in A-fib. Per Tawny Hopping we can decrease his Diltiazem to 120 mg to see if this helps. He may also need to follow up with his pcp. Patient's daughter has decided to wait to assess him once she is back in town. Advised his daughter to have someone take him to the ER if he starts feeling lightheaded, dizzy, having shortness of breath or chest pain. Consulted with patient's daughter and she verbalized understanding.

## 2023-07-08 ENCOUNTER — Encounter: Payer: Self-pay | Admitting: Podiatry

## 2023-07-08 ENCOUNTER — Ambulatory Visit: Admitting: Podiatry

## 2023-07-08 DIAGNOSIS — M79675 Pain in left toe(s): Secondary | ICD-10-CM

## 2023-07-08 DIAGNOSIS — B351 Tinea unguium: Secondary | ICD-10-CM | POA: Diagnosis not present

## 2023-07-08 DIAGNOSIS — D6869 Other thrombophilia: Secondary | ICD-10-CM

## 2023-07-08 DIAGNOSIS — E1151 Type 2 diabetes mellitus with diabetic peripheral angiopathy without gangrene: Secondary | ICD-10-CM | POA: Diagnosis not present

## 2023-07-08 DIAGNOSIS — I480 Paroxysmal atrial fibrillation: Secondary | ICD-10-CM

## 2023-07-08 DIAGNOSIS — M79674 Pain in right toe(s): Secondary | ICD-10-CM

## 2023-07-08 HISTORY — DX: Type 2 diabetes mellitus with diabetic peripheral angiopathy without gangrene: E11.51

## 2023-07-08 NOTE — Progress Notes (Signed)
 Subjective:  Patient ID: Jonathan Owen, male    DOB: 24-Jan-1928,  MRN: [id number]  Jonathan Owen presents to clinic today for:  Chief Complaint  Patient presents with   Diabetes    "Examine his feet and trim them because he's Diabetic.  He's now on a blood thinner." N - toenails L - 1-5 bilateral D - 2 mos. Ago O - gradually worse C - big ones are hard to cut, thick, Diabetes, discolored, on blood thinners A - none T - Daughter stated she usually cuts them   New Patient Appt:  Patient notes nails are thick and elongated, causing pain in shoe gear when ambulating.  His daughter is with him today.  He is in a wheelchair due to recent weakness.  His daughter usually trims his toenails, but due to the recent health events, she wanted [place] to trim them today and then she'll take it back over.  She has concerns about recent A.Fib and possibility that his blood pressure medication needs lowered.    He was discharged from the hospital last month, where he was treated for sepsis.  He is now on a new blood pressure medication and takes Eliquis.  He has been feeling "lousy" since taking the blood pressure medication, to the point he will sometimes skip his daily dose of medication because he feels so bad.  His daughter is concerned about him skipping his medication.  Denies falls.  He has been diagnosed with A. Fib in the past.  However, every time he shows up to the A.fib clinic, he is not in A.fib, so he ends up being discharged.  Upon discharge from the hospital, they were told to f/u with cardiology, but the first appointment is not until June 3rd.    PCP is Carin Hock, [place].  Past Medical History:  Diagnosis Date   Arthritis    Cancer (HCC)    hx colon cancer   Diabetes mellitus without complication (HCC)    no meds-watches diet   Hypertension    no meds now-   Wears dentures    upper-partial bottom   Wears glasses     Allergies  Allergen Reactions   Tape Other (See  Comments)    Unknown    Objective:  Jonathan Owen is a pleasant 89 y.o. male in NAD. AAO x 3.  Vascular Examination: Patient has palpable DP pulse, absent PT pulse bilateral.  The pulse on both dorsalis pedis arteries is irregular (normal rhythm, followed by 6 faster beats, followed by 2 slow beats).  Delayed capillary refill bilateral toes.  Absent digital hair bilateral.  Proximal to distal cooling WNL bilateral.  Minimal pitting edema to lower legs and ankles.  Dermatological Examination: Interspaces are clear with no open lesions noted bilateral.  Skin is shiny and atrophic bilateral with decreased skin turgor.  Nails are 3-21mm thick, with yellowish/brown discoloration, subungual debris and distal onycholysis x10.  There is pain with compression of nails x10.       Latest Ref Rng & Units 05/26/2023    4:30 AM  Hemoglobin A1C  Hemoglobin-A1c 4.8 - 5.6 % 6.1    Patient qualifies for at-risk foot care because of PVD .  Assessment/Plan: 1. Type II diabetes mellitus with peripheral circulatory disorder (HCC)   2. Pain due to onychomycosis of toenails of both feet   3. Hypercoagulable state due to paroxysmal atrial fibrillation (HCC)    Mycotic nails x10 were sharply  debrided with sterile nail nippers and power debriding burr to decrease bulk and length.  Discussed the risks with the patient choosing on his own not to take the prescribed medication.  He states he doesn't feel as lousy if he skips the blood pressure medication (which he did today).  He wants to know if he can stop taking the Eliquis and just take ASA.  Informed him the cardiologist needs to advise them on this.  He stated his appt is not until June, and his daughter is very concerned that he'll get worse while awaiting the appointment, or simply stop taking his medicaiton, or have an embolic episode since his pedal pulses were abnormal today.  Informed the patient that I would reach out to Dr. Elease Hashimoto (cardiology) to see if  his appointment could be moved sooner due to the low BP readings at home, his new lethargy, and the present irregular heart beat.  Will send a copy of today's note to the specialist for review.    F/u prn, or every 3 months for at risk diabetic foot care.   Clerance Lav, DPM, FACFAS [facility]     [address] [place], [place] [address]                Office [phone]  Fax [phone]

## 2023-07-09 ENCOUNTER — Telehealth (HOSPITAL_COMMUNITY): Payer: Self-pay | Admitting: *Deleted

## 2023-07-09 ENCOUNTER — Other Ambulatory Visit (HOSPITAL_COMMUNITY): Payer: Self-pay

## 2023-07-09 ENCOUNTER — Other Ambulatory Visit (HOSPITAL_COMMUNITY): Payer: Medicare Other

## 2023-07-09 MED ORDER — DILTIAZEM HCL ER COATED BEADS 120 MG PO CP24: 120.0000 mg | ORAL_CAPSULE | Freq: Every day | ORAL | 3 refills | Status: AC

## 2023-07-09 NOTE — Telephone Encounter (Signed)
 Patients daughter, [name], states patient held dose of cardizem yesterday as he feels his bp is low on the 180 dose. Pt did have breakthrough afib without diltiazem. Discussed with Landry Mellow PA will decrease cardizem to 120mg  once a day at bedtime. Pt daughter will call if issues.

## 2023-07-17 DIAGNOSIS — E119 Type 2 diabetes mellitus without complications: Secondary | ICD-10-CM | POA: Diagnosis not present

## 2023-07-17 DIAGNOSIS — I480 Paroxysmal atrial fibrillation: Secondary | ICD-10-CM | POA: Diagnosis not present

## 2023-07-17 DIAGNOSIS — A419 Sepsis, unspecified organism: Secondary | ICD-10-CM | POA: Diagnosis not present

## 2023-07-17 DIAGNOSIS — E871 Hypo-osmolality and hyponatremia: Secondary | ICD-10-CM | POA: Diagnosis not present

## 2023-07-17 DIAGNOSIS — K810 Acute cholecystitis: Secondary | ICD-10-CM | POA: Diagnosis not present

## 2023-07-17 DIAGNOSIS — Z4803 Encounter for change or removal of drains: Secondary | ICD-10-CM | POA: Diagnosis not present

## 2023-07-17 DIAGNOSIS — I10 Essential (primary) hypertension: Secondary | ICD-10-CM | POA: Diagnosis not present

## 2023-07-17 DIAGNOSIS — Z7901 Long term (current) use of anticoagulants: Secondary | ICD-10-CM | POA: Diagnosis not present

## 2023-07-17 DIAGNOSIS — Z860100 Personal history of colon polyps, unspecified: Secondary | ICD-10-CM | POA: Diagnosis not present

## 2023-07-17 DIAGNOSIS — M1990 Unspecified osteoarthritis, unspecified site: Secondary | ICD-10-CM | POA: Diagnosis not present

## 2023-07-18 DIAGNOSIS — E871 Hypo-osmolality and hyponatremia: Secondary | ICD-10-CM | POA: Diagnosis not present

## 2023-07-18 DIAGNOSIS — I10 Essential (primary) hypertension: Secondary | ICD-10-CM | POA: Diagnosis not present

## 2023-07-18 DIAGNOSIS — I480 Paroxysmal atrial fibrillation: Secondary | ICD-10-CM | POA: Diagnosis not present

## 2023-07-18 DIAGNOSIS — M1990 Unspecified osteoarthritis, unspecified site: Secondary | ICD-10-CM | POA: Diagnosis not present

## 2023-07-18 DIAGNOSIS — A419 Sepsis, unspecified organism: Secondary | ICD-10-CM | POA: Diagnosis not present

## 2023-07-18 DIAGNOSIS — Z860100 Personal history of colon polyps, unspecified: Secondary | ICD-10-CM | POA: Diagnosis not present

## 2023-07-18 DIAGNOSIS — K810 Acute cholecystitis: Secondary | ICD-10-CM | POA: Diagnosis not present

## 2023-07-18 DIAGNOSIS — Z7901 Long term (current) use of anticoagulants: Secondary | ICD-10-CM | POA: Diagnosis not present

## 2023-07-18 DIAGNOSIS — Z4803 Encounter for change or removal of drains: Secondary | ICD-10-CM | POA: Diagnosis not present

## 2023-07-18 DIAGNOSIS — E119 Type 2 diabetes mellitus without complications: Secondary | ICD-10-CM | POA: Diagnosis not present

## 2023-07-22 ENCOUNTER — Ambulatory Visit (HOSPITAL_COMMUNITY)
Admission: RE | Admit: 2023-07-22 | Discharge: 2023-07-22 | Disposition: A | Discharge status: Home / Self Care | Source: Ambulatory Visit | Attending: Interventional Radiology | Admitting: Interventional Radiology

## 2023-07-22 DIAGNOSIS — K810 Acute cholecystitis: Secondary | ICD-10-CM | POA: Insufficient documentation

## 2023-07-22 DIAGNOSIS — R109 Unspecified abdominal pain: Secondary | ICD-10-CM | POA: Diagnosis not present

## 2023-07-22 DIAGNOSIS — Z434 Encounter for attention to other artificial openings of digestive tract: Secondary | ICD-10-CM | POA: Diagnosis not present

## 2023-07-22 DIAGNOSIS — R519 Headache, unspecified: Secondary | ICD-10-CM | POA: Diagnosis not present

## 2023-07-22 DIAGNOSIS — I4891 Unspecified atrial fibrillation: Secondary | ICD-10-CM | POA: Diagnosis not present

## 2023-07-22 HISTORY — PX: IR CHOLANGIOGRAM EXISTING TUBE: IMG6040

## 2023-07-22 MED ORDER — IOHEXOL 300 MG/ML  SOLN
50.0000 mL | Freq: Once | INTRAMUSCULAR | Status: AC | PRN
  Administered 2023-07-22: 10 mL

## 2023-07-24 DIAGNOSIS — A419 Sepsis, unspecified organism: Secondary | ICD-10-CM | POA: Diagnosis not present

## 2023-07-24 DIAGNOSIS — Z4803 Encounter for change or removal of drains: Secondary | ICD-10-CM | POA: Diagnosis not present

## 2023-07-24 DIAGNOSIS — K810 Acute cholecystitis: Secondary | ICD-10-CM | POA: Diagnosis not present

## 2023-07-24 DIAGNOSIS — Z7901 Long term (current) use of anticoagulants: Secondary | ICD-10-CM | POA: Diagnosis not present

## 2023-07-24 DIAGNOSIS — I480 Paroxysmal atrial fibrillation: Secondary | ICD-10-CM | POA: Diagnosis not present

## 2023-07-24 DIAGNOSIS — I10 Essential (primary) hypertension: Secondary | ICD-10-CM | POA: Diagnosis not present

## 2023-07-24 DIAGNOSIS — E119 Type 2 diabetes mellitus without complications: Secondary | ICD-10-CM | POA: Diagnosis not present

## 2023-07-24 DIAGNOSIS — E871 Hypo-osmolality and hyponatremia: Secondary | ICD-10-CM | POA: Diagnosis not present

## 2023-07-24 DIAGNOSIS — M1990 Unspecified osteoarthritis, unspecified site: Secondary | ICD-10-CM | POA: Diagnosis not present

## 2023-07-24 DIAGNOSIS — Z860100 Personal history of colon polyps, unspecified: Secondary | ICD-10-CM | POA: Diagnosis not present

## 2023-07-25 DIAGNOSIS — Z860100 Personal history of colon polyps, unspecified: Secondary | ICD-10-CM | POA: Diagnosis not present

## 2023-07-25 DIAGNOSIS — M1990 Unspecified osteoarthritis, unspecified site: Secondary | ICD-10-CM | POA: Diagnosis not present

## 2023-07-25 DIAGNOSIS — I480 Paroxysmal atrial fibrillation: Secondary | ICD-10-CM | POA: Diagnosis not present

## 2023-07-25 DIAGNOSIS — K810 Acute cholecystitis: Secondary | ICD-10-CM | POA: Diagnosis not present

## 2023-07-25 DIAGNOSIS — A419 Sepsis, unspecified organism: Secondary | ICD-10-CM | POA: Diagnosis not present

## 2023-07-25 DIAGNOSIS — Z4803 Encounter for change or removal of drains: Secondary | ICD-10-CM | POA: Diagnosis not present

## 2023-07-25 DIAGNOSIS — E119 Type 2 diabetes mellitus without complications: Secondary | ICD-10-CM | POA: Diagnosis not present

## 2023-07-25 DIAGNOSIS — Z7901 Long term (current) use of anticoagulants: Secondary | ICD-10-CM | POA: Diagnosis not present

## 2023-07-25 DIAGNOSIS — I10 Essential (primary) hypertension: Secondary | ICD-10-CM | POA: Diagnosis not present

## 2023-07-25 DIAGNOSIS — E871 Hypo-osmolality and hyponatremia: Secondary | ICD-10-CM | POA: Diagnosis not present

## 2023-07-31 DIAGNOSIS — I10 Essential (primary) hypertension: Secondary | ICD-10-CM | POA: Diagnosis not present

## 2023-07-31 DIAGNOSIS — Z4803 Encounter for change or removal of drains: Secondary | ICD-10-CM | POA: Diagnosis not present

## 2023-07-31 DIAGNOSIS — Z7901 Long term (current) use of anticoagulants: Secondary | ICD-10-CM | POA: Diagnosis not present

## 2023-07-31 DIAGNOSIS — K810 Acute cholecystitis: Secondary | ICD-10-CM | POA: Diagnosis not present

## 2023-07-31 DIAGNOSIS — Z860100 Personal history of colon polyps, unspecified: Secondary | ICD-10-CM | POA: Diagnosis not present

## 2023-07-31 DIAGNOSIS — M1990 Unspecified osteoarthritis, unspecified site: Secondary | ICD-10-CM | POA: Diagnosis not present

## 2023-07-31 DIAGNOSIS — I480 Paroxysmal atrial fibrillation: Secondary | ICD-10-CM | POA: Diagnosis not present

## 2023-07-31 DIAGNOSIS — A419 Sepsis, unspecified organism: Secondary | ICD-10-CM | POA: Diagnosis not present

## 2023-07-31 DIAGNOSIS — E871 Hypo-osmolality and hyponatremia: Secondary | ICD-10-CM | POA: Diagnosis not present

## 2023-07-31 DIAGNOSIS — E119 Type 2 diabetes mellitus without complications: Secondary | ICD-10-CM | POA: Diagnosis not present

## 2023-08-04 DIAGNOSIS — M1990 Unspecified osteoarthritis, unspecified site: Secondary | ICD-10-CM | POA: Diagnosis not present

## 2023-08-04 DIAGNOSIS — Z4803 Encounter for change or removal of drains: Secondary | ICD-10-CM | POA: Diagnosis not present

## 2023-08-04 DIAGNOSIS — K810 Acute cholecystitis: Secondary | ICD-10-CM | POA: Diagnosis not present

## 2023-08-04 DIAGNOSIS — E119 Type 2 diabetes mellitus without complications: Secondary | ICD-10-CM | POA: Diagnosis not present

## 2023-08-04 DIAGNOSIS — I10 Essential (primary) hypertension: Secondary | ICD-10-CM | POA: Diagnosis not present

## 2023-08-04 DIAGNOSIS — E871 Hypo-osmolality and hyponatremia: Secondary | ICD-10-CM | POA: Diagnosis not present

## 2023-08-04 DIAGNOSIS — I480 Paroxysmal atrial fibrillation: Secondary | ICD-10-CM | POA: Diagnosis not present

## 2023-08-04 DIAGNOSIS — Z7901 Long term (current) use of anticoagulants: Secondary | ICD-10-CM | POA: Diagnosis not present

## 2023-08-04 DIAGNOSIS — Z860100 Personal history of colon polyps, unspecified: Secondary | ICD-10-CM | POA: Diagnosis not present

## 2023-08-04 DIAGNOSIS — A419 Sepsis, unspecified organism: Secondary | ICD-10-CM | POA: Diagnosis not present

## 2023-08-06 DIAGNOSIS — A419 Sepsis, unspecified organism: Secondary | ICD-10-CM | POA: Diagnosis not present

## 2023-08-06 DIAGNOSIS — Z7901 Long term (current) use of anticoagulants: Secondary | ICD-10-CM | POA: Diagnosis not present

## 2023-08-06 DIAGNOSIS — Z4803 Encounter for change or removal of drains: Secondary | ICD-10-CM | POA: Diagnosis not present

## 2023-08-06 DIAGNOSIS — Z860100 Personal history of colon polyps, unspecified: Secondary | ICD-10-CM | POA: Diagnosis not present

## 2023-08-06 DIAGNOSIS — E871 Hypo-osmolality and hyponatremia: Secondary | ICD-10-CM | POA: Diagnosis not present

## 2023-08-06 DIAGNOSIS — K810 Acute cholecystitis: Secondary | ICD-10-CM | POA: Diagnosis not present

## 2023-08-06 DIAGNOSIS — E119 Type 2 diabetes mellitus without complications: Secondary | ICD-10-CM | POA: Diagnosis not present

## 2023-08-06 DIAGNOSIS — I10 Essential (primary) hypertension: Secondary | ICD-10-CM | POA: Diagnosis not present

## 2023-08-06 DIAGNOSIS — I480 Paroxysmal atrial fibrillation: Secondary | ICD-10-CM | POA: Diagnosis not present

## 2023-08-06 DIAGNOSIS — M1990 Unspecified osteoarthritis, unspecified site: Secondary | ICD-10-CM | POA: Diagnosis not present

## 2023-08-12 DIAGNOSIS — I480 Paroxysmal atrial fibrillation: Secondary | ICD-10-CM | POA: Diagnosis not present

## 2023-08-12 DIAGNOSIS — A419 Sepsis, unspecified organism: Secondary | ICD-10-CM | POA: Diagnosis not present

## 2023-08-12 DIAGNOSIS — Z860100 Personal history of colon polyps, unspecified: Secondary | ICD-10-CM | POA: Diagnosis not present

## 2023-08-12 DIAGNOSIS — M1990 Unspecified osteoarthritis, unspecified site: Secondary | ICD-10-CM | POA: Diagnosis not present

## 2023-08-12 DIAGNOSIS — I10 Essential (primary) hypertension: Secondary | ICD-10-CM | POA: Diagnosis not present

## 2023-08-12 DIAGNOSIS — Z7901 Long term (current) use of anticoagulants: Secondary | ICD-10-CM | POA: Diagnosis not present

## 2023-08-12 DIAGNOSIS — E871 Hypo-osmolality and hyponatremia: Secondary | ICD-10-CM | POA: Diagnosis not present

## 2023-08-12 DIAGNOSIS — E119 Type 2 diabetes mellitus without complications: Secondary | ICD-10-CM | POA: Diagnosis not present

## 2023-08-12 DIAGNOSIS — Z4803 Encounter for change or removal of drains: Secondary | ICD-10-CM | POA: Diagnosis not present

## 2023-08-12 DIAGNOSIS — K810 Acute cholecystitis: Secondary | ICD-10-CM | POA: Diagnosis not present

## 2023-08-20 DIAGNOSIS — E538 Deficiency of other specified B group vitamins: Secondary | ICD-10-CM | POA: Diagnosis not present

## 2023-08-20 DIAGNOSIS — E119 Type 2 diabetes mellitus without complications: Secondary | ICD-10-CM | POA: Diagnosis not present

## 2023-08-20 DIAGNOSIS — Z794 Long term (current) use of insulin: Secondary | ICD-10-CM | POA: Diagnosis not present

## 2023-08-21 ENCOUNTER — Ambulatory Visit: Attending: Cardiology | Admitting: Cardiology

## 2023-08-21 VITALS — BP 120/68 | HR 103 | Resp 16 | Ht 71.0 in | Wt 191.0 lb

## 2023-08-21 DIAGNOSIS — I77810 Thoracic aortic ectasia: Secondary | ICD-10-CM | POA: Diagnosis not present

## 2023-08-21 DIAGNOSIS — I480 Paroxysmal atrial fibrillation: Secondary | ICD-10-CM | POA: Diagnosis not present

## 2023-08-21 DIAGNOSIS — I350 Nonrheumatic aortic (valve) stenosis: Secondary | ICD-10-CM | POA: Insufficient documentation

## 2023-08-21 NOTE — Progress Notes (Signed)
 Cardiology Office Note:  .   Date:  08/21/2023  ID:  Jonathan Owen, DOB [birth year], MRN [id number] PCP: Dickey Fought, PA  [facility] Providers Cardiologist:  Fransico Ivy, MD PCP: Dickey Fought, PA  Chief Complaint  Patient presents with   Atrial Fibrillation   New Patient (Initial Visit)     Jonathan Owen is a 89 y.o. male with hypertension, diabetes mellitus, aortic atherosclerosis, mild AS, dialted aortic root, remote h/o colon cancer  Patient was found to have A-fib during his hospitalization in 05/2023 with sepsis likely acute cholecystitis.  This has since resolved.  Patient is here today with his daughter.  He lives by himself, performs most of his ADLs without any help.  He denies any progressive chest pain or shortness of breath.  He remains skeptical about use of Eliquis , but does not report any specific side effects, no bleeding issues.   Vitals:   08/21/23 0826  BP: 120/68  Pulse: (!) 103  Resp: 16  SpO2: 96%      Review of Systems  Constitutional: Positive for malaise/fatigue.  Cardiovascular:  Negative for chest pain, dyspnea on exertion, leg swelling, palpitations and syncope.        Studies Reviewed: Jonathan Owen        EKG 08/21/2023: Sinus tachycardia with Premature atrial complexes When compared with ECG of 21-Jun-2023 11:07, Premature atrial complexes are now Present    Independently interpreted 05/2023: HbA1C 6.1% Hb 12.9 Cr 0.7    1. Left ventricular ejection fraction, by estimation, is 60 to 65%. The  left ventricle has normal function. Left ventricular endocardial border  not optimally defined to evaluate regional wall motion. Left ventricular  diastolic parameters are consistent  with Grade I diastolic dysfunction (impaired relaxation).   2. Right ventricular systolic function was not well visualized. The right  ventricular size is normal.   3. Left atrial size was moderately dilated.   4. The mitral valve is grossly  normal. No evidence of mitral valve  regurgitation. No evidence of mitral stenosis.   5. The aortic valve was not well visualized. There is moderate  calcification of the aortic valve. Aortic valve regurgitation is not  visualized. Aortic valve sclerosis/calcification is present, without any  evidence of aortic stenosis. Aortic valve area,  by VTI measures 2.09 cm. Aortic valve mean gradient measures 11.0 mmHg.  Aortic valve Vmax measures 1.92 m/s.   6. Aortic dilatation noted. There is mild dilatation of the aortic root,  measuring 42 mm. Normal size of ascending aorta.   Comparison(s): No prior Echocardiogram.    Risk Assessment/Calculations:    CHA2DS2-VASc Score = 5   This indicates a 7.2% annual risk of stroke. The patient's score is based upon: CHF History: 0 HTN History: 1 Diabetes History: 1 Stroke History: 0 Vascular Disease History: 1 Age Score: 2 Gender Score: 0      Physical Exam Vitals and nursing note reviewed.  Constitutional:      General: He is not in acute distress. Neck:     Vascular: No JVD.  Cardiovascular:     Rate and Rhythm: Normal rate and regular rhythm.     Heart sounds: Normal heart sounds. No murmur heard. Pulmonary:     Effort: Pulmonary effort is normal.     Breath sounds: Normal breath sounds. No wheezing or rales.  Musculoskeletal:     Right lower leg: Edema present.     Left lower leg: No edema.      VISIT  DIAGNOSES:   ICD-10-CM   1. PAF (paroxysmal atrial fibrillation) (HCC)  I48.0 EKG 12-Lead    2. Dilated aortic root (HCC)  I77.810 ECHOCARDIOGRAM COMPLETE    3. Nonrheumatic aortic valve stenosis  I35.0 ECHOCARDIOGRAM COMPLETE       Jonathan Owen is a 89 y.o. male with hypertension, diabetes mellitus, aortic atherosclerosis, mild AS, dialted aortic root, remote h/o colon cancer  Assessment & Plan  Paroxysmal atrial fibrillation: Recurrence during sepsis due to acute cholecystitis in 05/2023.  No known recurrence  since then. Continue diltiazem  CD1 120 mg daily. Continue Eliquis  5 mg twice daily.  Discussed benefits versus risks in the setting of paroxysmal A-fib and high stroke risk.  Patient is agreeable.  Aortic stenosis, dilated aortic root: Mild, incidental finding.  No associated symptoms. Repeat echocardiogram in 1 year.      F/u in 1 year  Signed, Cody Das, MD

## 2023-08-21 NOTE — Patient Instructions (Signed)
 Testing/Procedures: Echo in 1 year   Follow-Up: At [facility], you and your health needs are our priority.  As part of our continuing mission to provide you with exceptional heart care, our providers are all part of one team.  This team includes your primary Cardiologist (physician) and Advanced Practice Providers or APPs (Physician Assistants and Nurse Practitioners) who all work together to provide you with the care you need, when you need it.  Your next appointment:   1 year(s)  Provider:   Cody Das, MD

## 2023-08-28 DIAGNOSIS — E109 Type 1 diabetes mellitus without complications: Secondary | ICD-10-CM | POA: Diagnosis not present

## 2023-09-04 DIAGNOSIS — Z860100 Personal history of colon polyps, unspecified: Secondary | ICD-10-CM | POA: Diagnosis not present

## 2023-09-04 DIAGNOSIS — Z7901 Long term (current) use of anticoagulants: Secondary | ICD-10-CM | POA: Diagnosis not present

## 2023-09-04 DIAGNOSIS — K810 Acute cholecystitis: Secondary | ICD-10-CM | POA: Diagnosis not present

## 2023-09-04 DIAGNOSIS — E119 Type 2 diabetes mellitus without complications: Secondary | ICD-10-CM | POA: Diagnosis not present

## 2023-09-04 DIAGNOSIS — Z4803 Encounter for change or removal of drains: Secondary | ICD-10-CM | POA: Diagnosis not present

## 2023-09-04 DIAGNOSIS — E871 Hypo-osmolality and hyponatremia: Secondary | ICD-10-CM | POA: Diagnosis not present

## 2023-09-04 DIAGNOSIS — I10 Essential (primary) hypertension: Secondary | ICD-10-CM | POA: Diagnosis not present

## 2023-09-04 DIAGNOSIS — I480 Paroxysmal atrial fibrillation: Secondary | ICD-10-CM | POA: Diagnosis not present

## 2023-09-04 DIAGNOSIS — A419 Sepsis, unspecified organism: Secondary | ICD-10-CM | POA: Diagnosis not present

## 2023-09-04 DIAGNOSIS — M1990 Unspecified osteoarthritis, unspecified site: Secondary | ICD-10-CM | POA: Diagnosis not present

## 2023-09-12 DIAGNOSIS — Z860100 Personal history of colon polyps, unspecified: Secondary | ICD-10-CM | POA: Diagnosis not present

## 2023-09-12 DIAGNOSIS — M1990 Unspecified osteoarthritis, unspecified site: Secondary | ICD-10-CM | POA: Diagnosis not present

## 2023-09-12 DIAGNOSIS — I480 Paroxysmal atrial fibrillation: Secondary | ICD-10-CM | POA: Diagnosis not present

## 2023-09-12 DIAGNOSIS — Z4803 Encounter for change or removal of drains: Secondary | ICD-10-CM | POA: Diagnosis not present

## 2023-09-12 DIAGNOSIS — Z7901 Long term (current) use of anticoagulants: Secondary | ICD-10-CM | POA: Diagnosis not present

## 2023-09-12 DIAGNOSIS — E871 Hypo-osmolality and hyponatremia: Secondary | ICD-10-CM | POA: Diagnosis not present

## 2023-09-12 DIAGNOSIS — A419 Sepsis, unspecified organism: Secondary | ICD-10-CM | POA: Diagnosis not present

## 2023-09-12 DIAGNOSIS — E119 Type 2 diabetes mellitus without complications: Secondary | ICD-10-CM | POA: Diagnosis not present

## 2023-09-12 DIAGNOSIS — K810 Acute cholecystitis: Secondary | ICD-10-CM | POA: Diagnosis not present

## 2023-09-12 DIAGNOSIS — I10 Essential (primary) hypertension: Secondary | ICD-10-CM | POA: Diagnosis not present

## 2023-09-17 ENCOUNTER — Ambulatory Visit: Admitting: Cardiovascular Disease

## 2023-09-17 DIAGNOSIS — Z7901 Long term (current) use of anticoagulants: Secondary | ICD-10-CM | POA: Diagnosis not present

## 2023-09-17 DIAGNOSIS — Z4803 Encounter for change or removal of drains: Secondary | ICD-10-CM | POA: Diagnosis not present

## 2023-09-17 DIAGNOSIS — E871 Hypo-osmolality and hyponatremia: Secondary | ICD-10-CM | POA: Diagnosis not present

## 2023-09-17 DIAGNOSIS — I480 Paroxysmal atrial fibrillation: Secondary | ICD-10-CM | POA: Diagnosis not present

## 2023-09-17 DIAGNOSIS — Z860100 Personal history of colon polyps, unspecified: Secondary | ICD-10-CM | POA: Diagnosis not present

## 2023-09-17 DIAGNOSIS — K810 Acute cholecystitis: Secondary | ICD-10-CM | POA: Diagnosis not present

## 2023-09-17 DIAGNOSIS — E119 Type 2 diabetes mellitus without complications: Secondary | ICD-10-CM | POA: Diagnosis not present

## 2023-09-17 DIAGNOSIS — A419 Sepsis, unspecified organism: Secondary | ICD-10-CM | POA: Diagnosis not present

## 2023-09-17 DIAGNOSIS — I10 Essential (primary) hypertension: Secondary | ICD-10-CM | POA: Diagnosis not present

## 2023-09-17 DIAGNOSIS — M1990 Unspecified osteoarthritis, unspecified site: Secondary | ICD-10-CM | POA: Diagnosis not present

## 2023-09-25 DIAGNOSIS — A419 Sepsis, unspecified organism: Secondary | ICD-10-CM | POA: Diagnosis not present

## 2023-09-25 DIAGNOSIS — E871 Hypo-osmolality and hyponatremia: Secondary | ICD-10-CM | POA: Diagnosis not present

## 2023-09-25 DIAGNOSIS — Z4803 Encounter for change or removal of drains: Secondary | ICD-10-CM | POA: Diagnosis not present

## 2023-09-25 DIAGNOSIS — Z7901 Long term (current) use of anticoagulants: Secondary | ICD-10-CM | POA: Diagnosis not present

## 2023-09-25 DIAGNOSIS — I480 Paroxysmal atrial fibrillation: Secondary | ICD-10-CM | POA: Diagnosis not present

## 2023-09-25 DIAGNOSIS — K810 Acute cholecystitis: Secondary | ICD-10-CM | POA: Diagnosis not present

## 2023-09-25 DIAGNOSIS — E119 Type 2 diabetes mellitus without complications: Secondary | ICD-10-CM | POA: Diagnosis not present

## 2023-09-25 DIAGNOSIS — M1990 Unspecified osteoarthritis, unspecified site: Secondary | ICD-10-CM | POA: Diagnosis not present

## 2023-09-25 DIAGNOSIS — Z860100 Personal history of colon polyps, unspecified: Secondary | ICD-10-CM | POA: Diagnosis not present

## 2023-09-25 DIAGNOSIS — I10 Essential (primary) hypertension: Secondary | ICD-10-CM | POA: Diagnosis not present

## 2023-09-30 DIAGNOSIS — E871 Hypo-osmolality and hyponatremia: Secondary | ICD-10-CM | POA: Diagnosis not present

## 2023-09-30 DIAGNOSIS — Z7901 Long term (current) use of anticoagulants: Secondary | ICD-10-CM | POA: Diagnosis not present

## 2023-09-30 DIAGNOSIS — I480 Paroxysmal atrial fibrillation: Secondary | ICD-10-CM | POA: Diagnosis not present

## 2023-09-30 DIAGNOSIS — E119 Type 2 diabetes mellitus without complications: Secondary | ICD-10-CM | POA: Diagnosis not present

## 2023-09-30 DIAGNOSIS — Z860100 Personal history of colon polyps, unspecified: Secondary | ICD-10-CM | POA: Diagnosis not present

## 2023-09-30 DIAGNOSIS — A419 Sepsis, unspecified organism: Secondary | ICD-10-CM | POA: Diagnosis not present

## 2023-09-30 DIAGNOSIS — Z4803 Encounter for change or removal of drains: Secondary | ICD-10-CM | POA: Diagnosis not present

## 2023-09-30 DIAGNOSIS — M1990 Unspecified osteoarthritis, unspecified site: Secondary | ICD-10-CM | POA: Diagnosis not present

## 2023-09-30 DIAGNOSIS — I10 Essential (primary) hypertension: Secondary | ICD-10-CM | POA: Diagnosis not present

## 2023-09-30 DIAGNOSIS — K810 Acute cholecystitis: Secondary | ICD-10-CM | POA: Diagnosis not present

## 2023-10-11 DIAGNOSIS — A419 Sepsis, unspecified organism: Secondary | ICD-10-CM | POA: Diagnosis not present

## 2023-10-11 DIAGNOSIS — Z860100 Personal history of colon polyps, unspecified: Secondary | ICD-10-CM | POA: Diagnosis not present

## 2023-10-11 DIAGNOSIS — M1990 Unspecified osteoarthritis, unspecified site: Secondary | ICD-10-CM | POA: Diagnosis not present

## 2023-10-11 DIAGNOSIS — E119 Type 2 diabetes mellitus without complications: Secondary | ICD-10-CM | POA: Diagnosis not present

## 2023-10-11 DIAGNOSIS — E871 Hypo-osmolality and hyponatremia: Secondary | ICD-10-CM | POA: Diagnosis not present

## 2023-10-11 DIAGNOSIS — Z7901 Long term (current) use of anticoagulants: Secondary | ICD-10-CM | POA: Diagnosis not present

## 2023-10-11 DIAGNOSIS — K810 Acute cholecystitis: Secondary | ICD-10-CM | POA: Diagnosis not present

## 2023-10-11 DIAGNOSIS — I480 Paroxysmal atrial fibrillation: Secondary | ICD-10-CM | POA: Diagnosis not present

## 2023-10-11 DIAGNOSIS — I10 Essential (primary) hypertension: Secondary | ICD-10-CM | POA: Diagnosis not present

## 2023-10-11 DIAGNOSIS — Z4803 Encounter for change or removal of drains: Secondary | ICD-10-CM | POA: Diagnosis not present

## 2023-10-14 DIAGNOSIS — M5416 Radiculopathy, lumbar region: Secondary | ICD-10-CM | POA: Diagnosis not present

## 2023-10-14 DIAGNOSIS — M48062 Spinal stenosis, lumbar region with neurogenic claudication: Secondary | ICD-10-CM | POA: Diagnosis not present

## 2023-10-16 DIAGNOSIS — M48062 Spinal stenosis, lumbar region with neurogenic claudication: Secondary | ICD-10-CM | POA: Diagnosis not present

## 2023-10-16 DIAGNOSIS — M5416 Radiculopathy, lumbar region: Secondary | ICD-10-CM | POA: Diagnosis not present

## 2023-10-24 DIAGNOSIS — I480 Paroxysmal atrial fibrillation: Secondary | ICD-10-CM | POA: Diagnosis not present

## 2023-10-24 DIAGNOSIS — Z860100 Personal history of colon polyps, unspecified: Secondary | ICD-10-CM | POA: Diagnosis not present

## 2023-10-24 DIAGNOSIS — Z4803 Encounter for change or removal of drains: Secondary | ICD-10-CM | POA: Diagnosis not present

## 2023-10-24 DIAGNOSIS — M1990 Unspecified osteoarthritis, unspecified site: Secondary | ICD-10-CM | POA: Diagnosis not present

## 2023-10-24 DIAGNOSIS — I10 Essential (primary) hypertension: Secondary | ICD-10-CM | POA: Diagnosis not present

## 2023-10-24 DIAGNOSIS — A419 Sepsis, unspecified organism: Secondary | ICD-10-CM | POA: Diagnosis not present

## 2023-10-24 DIAGNOSIS — E871 Hypo-osmolality and hyponatremia: Secondary | ICD-10-CM | POA: Diagnosis not present

## 2023-10-24 DIAGNOSIS — E119 Type 2 diabetes mellitus without complications: Secondary | ICD-10-CM | POA: Diagnosis not present

## 2023-10-24 DIAGNOSIS — Z7901 Long term (current) use of anticoagulants: Secondary | ICD-10-CM | POA: Diagnosis not present

## 2023-10-28 DIAGNOSIS — Z4803 Encounter for change or removal of drains: Secondary | ICD-10-CM | POA: Diagnosis not present

## 2023-10-28 DIAGNOSIS — E119 Type 2 diabetes mellitus without complications: Secondary | ICD-10-CM | POA: Diagnosis not present

## 2023-10-28 DIAGNOSIS — Z7901 Long term (current) use of anticoagulants: Secondary | ICD-10-CM | POA: Diagnosis not present

## 2023-10-28 DIAGNOSIS — A419 Sepsis, unspecified organism: Secondary | ICD-10-CM | POA: Diagnosis not present

## 2023-10-28 DIAGNOSIS — I480 Paroxysmal atrial fibrillation: Secondary | ICD-10-CM | POA: Diagnosis not present

## 2023-10-28 DIAGNOSIS — Z860100 Personal history of colon polyps, unspecified: Secondary | ICD-10-CM | POA: Diagnosis not present

## 2023-10-28 DIAGNOSIS — M1990 Unspecified osteoarthritis, unspecified site: Secondary | ICD-10-CM | POA: Diagnosis not present

## 2023-10-28 DIAGNOSIS — E871 Hypo-osmolality and hyponatremia: Secondary | ICD-10-CM | POA: Diagnosis not present

## 2023-10-28 DIAGNOSIS — I10 Essential (primary) hypertension: Secondary | ICD-10-CM | POA: Diagnosis not present

## 2023-10-30 ENCOUNTER — Other Ambulatory Visit (HOSPITAL_COMMUNITY): Payer: Self-pay | Admitting: Internal Medicine

## 2023-10-30 DIAGNOSIS — E119 Type 2 diabetes mellitus without complications: Secondary | ICD-10-CM | POA: Diagnosis not present

## 2023-10-30 DIAGNOSIS — Z7901 Long term (current) use of anticoagulants: Secondary | ICD-10-CM | POA: Diagnosis not present

## 2023-10-30 DIAGNOSIS — I480 Paroxysmal atrial fibrillation: Secondary | ICD-10-CM

## 2023-10-30 DIAGNOSIS — I10 Essential (primary) hypertension: Secondary | ICD-10-CM | POA: Diagnosis not present

## 2023-10-30 DIAGNOSIS — M1990 Unspecified osteoarthritis, unspecified site: Secondary | ICD-10-CM | POA: Diagnosis not present

## 2023-10-30 DIAGNOSIS — Z4803 Encounter for change or removal of drains: Secondary | ICD-10-CM | POA: Diagnosis not present

## 2023-10-30 DIAGNOSIS — E871 Hypo-osmolality and hyponatremia: Secondary | ICD-10-CM | POA: Diagnosis not present

## 2023-10-30 DIAGNOSIS — Z860100 Personal history of colon polyps, unspecified: Secondary | ICD-10-CM | POA: Diagnosis not present

## 2023-10-30 DIAGNOSIS — A419 Sepsis, unspecified organism: Secondary | ICD-10-CM | POA: Diagnosis not present

## 2023-10-30 NOTE — Telephone Encounter (Signed)
 Eliquis  5mg  refill request received. Patient is 89 years old, weight-86.6kg, Crea-0.77 on 06/21/23, Diagnosis-Afib, and last seen by Dr. Elmira on 08/21/23. Dose is appropriate based on dosing criteria. Will send in refill to requested pharmacy.

## 2023-11-04 DIAGNOSIS — I480 Paroxysmal atrial fibrillation: Secondary | ICD-10-CM | POA: Diagnosis not present

## 2023-11-04 DIAGNOSIS — Z4803 Encounter for change or removal of drains: Secondary | ICD-10-CM | POA: Diagnosis not present

## 2023-11-04 DIAGNOSIS — I10 Essential (primary) hypertension: Secondary | ICD-10-CM | POA: Diagnosis not present

## 2023-11-04 DIAGNOSIS — E871 Hypo-osmolality and hyponatremia: Secondary | ICD-10-CM | POA: Diagnosis not present

## 2023-11-04 DIAGNOSIS — E119 Type 2 diabetes mellitus without complications: Secondary | ICD-10-CM | POA: Diagnosis not present

## 2023-11-04 DIAGNOSIS — Z7901 Long term (current) use of anticoagulants: Secondary | ICD-10-CM | POA: Diagnosis not present

## 2023-11-04 DIAGNOSIS — M1990 Unspecified osteoarthritis, unspecified site: Secondary | ICD-10-CM | POA: Diagnosis not present

## 2023-11-04 DIAGNOSIS — A419 Sepsis, unspecified organism: Secondary | ICD-10-CM | POA: Diagnosis not present

## 2023-11-04 DIAGNOSIS — Z860100 Personal history of colon polyps, unspecified: Secondary | ICD-10-CM | POA: Diagnosis not present

## 2023-11-11 DIAGNOSIS — A419 Sepsis, unspecified organism: Secondary | ICD-10-CM | POA: Diagnosis not present

## 2023-11-11 DIAGNOSIS — Z7901 Long term (current) use of anticoagulants: Secondary | ICD-10-CM | POA: Diagnosis not present

## 2023-11-11 DIAGNOSIS — I480 Paroxysmal atrial fibrillation: Secondary | ICD-10-CM | POA: Diagnosis not present

## 2023-11-11 DIAGNOSIS — Z4803 Encounter for change or removal of drains: Secondary | ICD-10-CM | POA: Diagnosis not present

## 2023-11-11 DIAGNOSIS — E119 Type 2 diabetes mellitus without complications: Secondary | ICD-10-CM | POA: Diagnosis not present

## 2023-11-11 DIAGNOSIS — M1990 Unspecified osteoarthritis, unspecified site: Secondary | ICD-10-CM | POA: Diagnosis not present

## 2023-11-11 DIAGNOSIS — E871 Hypo-osmolality and hyponatremia: Secondary | ICD-10-CM | POA: Diagnosis not present

## 2023-11-11 DIAGNOSIS — I10 Essential (primary) hypertension: Secondary | ICD-10-CM | POA: Diagnosis not present

## 2023-11-11 DIAGNOSIS — Z860100 Personal history of colon polyps, unspecified: Secondary | ICD-10-CM | POA: Diagnosis not present

## 2023-11-18 DIAGNOSIS — M48062 Spinal stenosis, lumbar region with neurogenic claudication: Secondary | ICD-10-CM | POA: Diagnosis not present

## 2023-11-20 DIAGNOSIS — M810 Age-related osteoporosis without current pathological fracture: Secondary | ICD-10-CM | POA: Diagnosis not present

## 2023-11-22 DIAGNOSIS — I10 Essential (primary) hypertension: Secondary | ICD-10-CM | POA: Diagnosis not present

## 2023-11-22 DIAGNOSIS — E871 Hypo-osmolality and hyponatremia: Secondary | ICD-10-CM | POA: Diagnosis not present

## 2023-11-22 DIAGNOSIS — I480 Paroxysmal atrial fibrillation: Secondary | ICD-10-CM | POA: Diagnosis not present

## 2023-11-22 DIAGNOSIS — E119 Type 2 diabetes mellitus without complications: Secondary | ICD-10-CM | POA: Diagnosis not present

## 2023-11-22 DIAGNOSIS — A419 Sepsis, unspecified organism: Secondary | ICD-10-CM | POA: Diagnosis not present

## 2023-11-22 DIAGNOSIS — Z860100 Personal history of colon polyps, unspecified: Secondary | ICD-10-CM | POA: Diagnosis not present

## 2023-11-22 DIAGNOSIS — Z4803 Encounter for change or removal of drains: Secondary | ICD-10-CM | POA: Diagnosis not present

## 2023-11-22 DIAGNOSIS — M1990 Unspecified osteoarthritis, unspecified site: Secondary | ICD-10-CM | POA: Diagnosis not present

## 2023-11-22 DIAGNOSIS — Z7901 Long term (current) use of anticoagulants: Secondary | ICD-10-CM | POA: Diagnosis not present

## 2023-11-25 DIAGNOSIS — M5416 Radiculopathy, lumbar region: Secondary | ICD-10-CM | POA: Diagnosis not present

## 2023-11-27 DIAGNOSIS — I10 Essential (primary) hypertension: Secondary | ICD-10-CM | POA: Diagnosis not present

## 2023-11-27 DIAGNOSIS — E871 Hypo-osmolality and hyponatremia: Secondary | ICD-10-CM | POA: Diagnosis not present

## 2023-11-27 DIAGNOSIS — Z4803 Encounter for change or removal of drains: Secondary | ICD-10-CM | POA: Diagnosis not present

## 2023-11-27 DIAGNOSIS — E119 Type 2 diabetes mellitus without complications: Secondary | ICD-10-CM | POA: Diagnosis not present

## 2023-11-27 DIAGNOSIS — I480 Paroxysmal atrial fibrillation: Secondary | ICD-10-CM | POA: Diagnosis not present

## 2023-11-27 DIAGNOSIS — Z7901 Long term (current) use of anticoagulants: Secondary | ICD-10-CM | POA: Diagnosis not present

## 2023-11-27 DIAGNOSIS — A419 Sepsis, unspecified organism: Secondary | ICD-10-CM | POA: Diagnosis not present

## 2023-11-27 DIAGNOSIS — M1990 Unspecified osteoarthritis, unspecified site: Secondary | ICD-10-CM | POA: Diagnosis not present

## 2023-11-27 DIAGNOSIS — Z860100 Personal history of colon polyps, unspecified: Secondary | ICD-10-CM | POA: Diagnosis not present

## 2023-12-02 DIAGNOSIS — M1990 Unspecified osteoarthritis, unspecified site: Secondary | ICD-10-CM | POA: Diagnosis not present

## 2023-12-02 DIAGNOSIS — I480 Paroxysmal atrial fibrillation: Secondary | ICD-10-CM | POA: Diagnosis not present

## 2023-12-02 DIAGNOSIS — E119 Type 2 diabetes mellitus without complications: Secondary | ICD-10-CM | POA: Diagnosis not present

## 2023-12-02 DIAGNOSIS — Z7901 Long term (current) use of anticoagulants: Secondary | ICD-10-CM | POA: Diagnosis not present

## 2023-12-02 DIAGNOSIS — A419 Sepsis, unspecified organism: Secondary | ICD-10-CM | POA: Diagnosis not present

## 2023-12-02 DIAGNOSIS — E871 Hypo-osmolality and hyponatremia: Secondary | ICD-10-CM | POA: Diagnosis not present

## 2023-12-02 DIAGNOSIS — Z860100 Personal history of colon polyps, unspecified: Secondary | ICD-10-CM | POA: Diagnosis not present

## 2023-12-02 DIAGNOSIS — I10 Essential (primary) hypertension: Secondary | ICD-10-CM | POA: Diagnosis not present

## 2023-12-02 DIAGNOSIS — Z4803 Encounter for change or removal of drains: Secondary | ICD-10-CM | POA: Diagnosis not present

## 2023-12-12 DIAGNOSIS — Z860100 Personal history of colon polyps, unspecified: Secondary | ICD-10-CM | POA: Diagnosis not present

## 2023-12-12 DIAGNOSIS — Z4803 Encounter for change or removal of drains: Secondary | ICD-10-CM | POA: Diagnosis not present

## 2023-12-12 DIAGNOSIS — E871 Hypo-osmolality and hyponatremia: Secondary | ICD-10-CM | POA: Diagnosis not present

## 2023-12-12 DIAGNOSIS — I480 Paroxysmal atrial fibrillation: Secondary | ICD-10-CM | POA: Diagnosis not present

## 2023-12-12 DIAGNOSIS — A419 Sepsis, unspecified organism: Secondary | ICD-10-CM | POA: Diagnosis not present

## 2023-12-12 DIAGNOSIS — I10 Essential (primary) hypertension: Secondary | ICD-10-CM | POA: Diagnosis not present

## 2023-12-12 DIAGNOSIS — Z7901 Long term (current) use of anticoagulants: Secondary | ICD-10-CM | POA: Diagnosis not present

## 2023-12-12 DIAGNOSIS — M1990 Unspecified osteoarthritis, unspecified site: Secondary | ICD-10-CM | POA: Diagnosis not present

## 2023-12-26 DIAGNOSIS — M47816 Spondylosis without myelopathy or radiculopathy, lumbar region: Secondary | ICD-10-CM | POA: Diagnosis not present

## 2023-12-26 DIAGNOSIS — M48062 Spinal stenosis, lumbar region with neurogenic claudication: Secondary | ICD-10-CM | POA: Diagnosis not present

## 2024-01-07 DIAGNOSIS — M47816 Spondylosis without myelopathy or radiculopathy, lumbar region: Secondary | ICD-10-CM | POA: Diagnosis not present

## 2024-01-15 DIAGNOSIS — M810 Age-related osteoporosis without current pathological fracture: Secondary | ICD-10-CM | POA: Diagnosis not present

## 2024-01-15 DIAGNOSIS — E119 Type 2 diabetes mellitus without complications: Secondary | ICD-10-CM | POA: Diagnosis not present

## 2024-01-15 DIAGNOSIS — Z794 Long term (current) use of insulin: Secondary | ICD-10-CM | POA: Diagnosis not present

## 2024-01-15 NOTE — Progress Notes (Signed)
 Diabetes    [name] returns for f/u of DM II and Osteoporosis.  Has been fair since his previous visit in March 2025.  He is accompanied today by his daughter.    Did have one day that his sugars spiked up to 270 and he took some Novolog , but usually not requiring.  Has backed off on his Toujeo , as we previously discussed.   Diabetes: Diagnosed with DM II around 1997.  Taking Toujeo  at 10 units once a day.  Occasionally holds Toujeo  all together if BG is low.  Has Novolog  to take per SS as needed.     Took Januvia 100 mg daily briefly.  Tolerated okay.   On metformin in the past.  This caused diarrhea and headaches.  On glimepiride in the past.  This caused hypoglycemia.     BG are reviewed in his glucometer.  Average BG of 166.  Denies recent hypoglycemia.  A1C was 6.3% today, up from 6.1% when last checked.     Not on statin therapy at this time.   This caused GI upset.     Osteoporosis:   Had a bone density test done in April 2024.  T-score in Hip was -3.6 T-score in Forearm was -0.7 Spine was excluded for degenerative changes    He took Fosamax for 2 weeks. Discontinued due to severe GI upset and headaches.  Started Prolia  in 2022.  Most recent injection January 2025.     He is taking calcium  600mg  w/ Vit D 1 time/day and drinks milk.   Also taking Vit D 1,000iu daily OTC.      He does have a hx of hip fracture on fall from standing in March 2020.   Also has a hx of ankle frature 30 years and had a compression fracture of L1.   Was on steroids for 8 months straight a few years ago.     Review of Systems   All other systems reviewed and are negative.      Objective     Physical Exam   Eyes :      Pupils: Pupils are equal, round, and reactive to light.  Cardiovascular:      Rate and Rhythm: Normal rate and regular rhythm.  Pulmonary :      Effort: Pulmonary effort is normal.     Breath sounds: Normal breath sounds.  Musculoskeletal :     Cervical  back: Normal range of motion and neck supple.   Skin:     General: Skin is warm and dry.  Neurological :      Mental Status: He is alert and oriented to person, place, and time.   Psychiatric:        Mood and Affect: Mood normal.        Behavior: Behavior normal.        Assessment     1. Type 2 diabetes mellitus without complication, with long-term current use of insulin  (HCC)  - POCT HEMOGLOBIN A1C    2. Age-related osteoporosis without current pathological fracture    3. Long-term insulin  use (HCC)      Plan     Diabetes:  A1C is well below goal of < 8.5%, established for his age.  Continue Toujeo  at 10 units once a day.  Continue to work on diet and exercise as tolerated.  Patient is currently administering 1 daily injection of insulin .  Patient's insulin  treatment requires frequent adjustment based on glucose monitoring results. Call if  he has problems with hypoglycemia before his next visit.     Osteoporosis:  Will continue Prolia  injections q 6 months.  Take Calcium  600mg  with Vitamin D 1 to 2 tablets daily.     Will plan to repeat DEXA in April 2026, having these done through PCP w/ [facility].  Reminded daughter this will be due in April of next year.  May change PCPs, provided some names for Mds in our building.

## 2024-01-23 DIAGNOSIS — M5116 Intervertebral disc disorders with radiculopathy, lumbar region: Secondary | ICD-10-CM | POA: Diagnosis not present

## 2024-01-23 DIAGNOSIS — M791 Myalgia, unspecified site: Secondary | ICD-10-CM | POA: Diagnosis not present

## 2024-01-28 DIAGNOSIS — K219 Gastro-esophageal reflux disease without esophagitis: Secondary | ICD-10-CM | POA: Diagnosis not present

## 2024-01-28 DIAGNOSIS — K2950 Unspecified chronic gastritis without bleeding: Secondary | ICD-10-CM | POA: Diagnosis not present

## 2024-01-28 DIAGNOSIS — R110 Nausea: Secondary | ICD-10-CM | POA: Diagnosis not present

## 2024-02-05 DIAGNOSIS — R110 Nausea: Secondary | ICD-10-CM | POA: Diagnosis not present

## 2024-02-24 ENCOUNTER — Other Ambulatory Visit (HOSPITAL_COMMUNITY): Payer: Self-pay

## 2024-02-24 DIAGNOSIS — K819 Cholecystitis, unspecified: Secondary | ICD-10-CM

## 2024-02-27 ENCOUNTER — Encounter (HOSPITAL_COMMUNITY)

## 2024-03-03 ENCOUNTER — Emergency Department (HOSPITAL_COMMUNITY): Admission: EM | Admit: 2024-03-03 | Discharge: 2024-03-03 | Disposition: A | Discharge status: Home / Self Care

## 2024-03-03 ENCOUNTER — Emergency Department (HOSPITAL_COMMUNITY)

## 2024-03-03 ENCOUNTER — Other Ambulatory Visit: Payer: Self-pay

## 2024-03-03 DIAGNOSIS — Z794 Long term (current) use of insulin: Secondary | ICD-10-CM | POA: Insufficient documentation

## 2024-03-03 DIAGNOSIS — Z7901 Long term (current) use of anticoagulants: Secondary | ICD-10-CM | POA: Insufficient documentation

## 2024-03-03 DIAGNOSIS — I10 Essential (primary) hypertension: Secondary | ICD-10-CM | POA: Diagnosis not present

## 2024-03-03 DIAGNOSIS — R1013 Epigastric pain: Secondary | ICD-10-CM | POA: Diagnosis present

## 2024-03-03 DIAGNOSIS — Z79899 Other long term (current) drug therapy: Secondary | ICD-10-CM | POA: Diagnosis not present

## 2024-03-03 DIAGNOSIS — I480 Paroxysmal atrial fibrillation: Secondary | ICD-10-CM | POA: Insufficient documentation

## 2024-03-03 DIAGNOSIS — R1084 Generalized abdominal pain: Secondary | ICD-10-CM | POA: Diagnosis not present

## 2024-03-03 DIAGNOSIS — R1010 Upper abdominal pain, unspecified: Secondary | ICD-10-CM | POA: Insufficient documentation

## 2024-03-03 LAB — CBC
HCT: 40.6 % (ref 39.0–52.0)
Hemoglobin: 13.5 g/dL (ref 13.0–17.0)
MCH: 31 pg (ref 26.0–34.0)
MCHC: 33.3 g/dL (ref 30.0–36.0)
MCV: 93.3 fL (ref 80.0–100.0)
Platelets: 245 K/uL (ref 150–400)
RBC: 4.35 MIL/uL (ref 4.22–5.81)
RDW: 12.8 % (ref 11.5–15.5)
WBC: 7.1 K/uL (ref 4.0–10.5)
nRBC: 0 % (ref 0.0–0.2)

## 2024-03-03 LAB — I-STAT CHEM 8, ED
BUN: 8 mg/dL (ref 8–23)
Calcium, Ion: 1.16 mmol/L (ref 1.15–1.40)
Chloride: 100 mmol/L (ref 98–111)
Creatinine, Ser: 0.7 mg/dL (ref 0.61–1.24)
Glucose, Bld: 109 mg/dL — ABNORMAL HIGH (ref 70–99)
HCT: 40 % (ref 39.0–52.0)
Hemoglobin: 13.6 g/dL (ref 13.0–17.0)
Potassium: 4 mmol/L (ref 3.5–5.1)
Sodium: 135 mmol/L (ref 135–145)
TCO2: 24 mmol/L (ref 22–32)

## 2024-03-03 LAB — URINALYSIS, ROUTINE W REFLEX MICROSCOPIC
Bacteria, UA: NONE SEEN
Bilirubin Urine: NEGATIVE
Glucose, UA: NEGATIVE mg/dL
Ketones, ur: NEGATIVE mg/dL
Leukocytes,Ua: NEGATIVE
Nitrite: NEGATIVE
Protein, ur: NEGATIVE mg/dL
Specific Gravity, Urine: 1.006 (ref 1.005–1.030)
pH: 8 (ref 5.0–8.0)

## 2024-03-03 LAB — COMPREHENSIVE METABOLIC PANEL WITH GFR
ALT: 16 U/L (ref 0–44)
AST: 16 U/L (ref 15–41)
Albumin: 3.7 g/dL (ref 3.5–5.0)
Alkaline Phosphatase: 44 U/L (ref 38–126)
Anion gap: 9 (ref 5–15)
BUN: 9 mg/dL (ref 8–23)
CO2: 24 mmol/L (ref 22–32)
Calcium: 9 mg/dL (ref 8.9–10.3)
Chloride: 101 mmol/L (ref 98–111)
Creatinine, Ser: 0.58 mg/dL — ABNORMAL LOW (ref 0.61–1.24)
GFR, Estimated: >60 mL/min (ref >60)
Glucose, Bld: 114 mg/dL — ABNORMAL HIGH (ref 70–99)
Potassium: 4.1 mmol/L (ref 3.5–5.1)
Sodium: 135 mmol/L (ref 135–145)
Total Bilirubin: 0.7 mg/dL (ref 0.0–1.2)
Total Protein: 6.1 g/dL — ABNORMAL LOW (ref 6.5–8.1)

## 2024-03-03 LAB — LIPASE, BLOOD: Lipase: 30 U/L (ref 11–51)

## 2024-03-03 MED ORDER — IOHEXOL 300 MG/ML  SOLN
100.0000 mL | Freq: Once | INTRAMUSCULAR | Status: AC | PRN
  Administered 2024-03-03: 100 mL via INTRAVENOUS

## 2024-03-03 MED ORDER — LIDOCAINE VISCOUS HCL 2 % MT SOLN
15.0000 mL | Freq: Once | OROMUCOSAL | Status: AC
  Administered 2024-03-03: 15 mL via OROMUCOSAL

## 2024-03-03 MED ORDER — ALUM & MAG HYDROXIDE-SIMETH 200-200-20 MG/5ML PO SUSP
30.0000 mL | Freq: Once | ORAL | Status: AC
Start: 2024-03-03 — End: 2024-03-03
  Administered 2024-03-03: 30 mL via ORAL

## 2024-03-03 MED ORDER — ALUM & MAG HYDROXIDE-SIMETH 200-200-20 MG/5ML PO SUSP
ORAL | Status: AC
  Filled 2024-03-03: qty 30

## 2024-03-03 MED ORDER — KETOROLAC TROMETHAMINE 15 MG/ML IJ SOLN
15.0000 mg | Freq: Once | INTRAMUSCULAR | Status: AC
  Administered 2024-03-03: 15 mg via INTRAVENOUS

## 2024-03-03 MED ORDER — KETOROLAC TROMETHAMINE 15 MG/ML IJ SOLN
INTRAMUSCULAR | Status: AC
  Filled 2024-03-03: qty 1

## 2024-03-03 MED ORDER — LIDOCAINE VISCOUS HCL 2 % MT SOLN
OROMUCOSAL | Status: AC
  Filled 2024-03-03: qty 15

## 2024-03-03 NOTE — ED Triage Notes (Signed)
 Pt BIBA from home for abd pain starting around 1230 after lunch, was epigastric but has moved down to lower abd. Denies CP, SHOB, or back pain. Had recent gallbladder infection. Denies bowel changes. Was feeling nauseous earlier but not now. Denies bowel changes or urinary sx  148/90 Hr 86 Cbg 118

## 2024-03-03 NOTE — ED Provider Notes (Signed)
 [place] EMERGENCY DEPARTMENT AT [facility] Provider Note   CSN: [id number] Arrival date & time: 03/03/24  1818     Patient presents with: Abdominal Pain   Jonathan Owen is a 89 y.o. male patient with history of hypertension, paroxysmal atrial fibrillation on Eliquis  who presents to the emergency department today for further evaluation of abdominal pain.  Daughter is at bedside and provides some of the history.  She states that patient has had a history of gallbladder infection in the past.  He did not have it removed because he was not a surgical candidate secondary to his age.  He ended up having a drain for several months and he was overall doing better.  He states that over the last couple of months however, patient has been feeling similar to when he had the first gallbladder attack.  He states that he feels sick on his stomach.  Patient currently denies any vomiting, fever, chills, urinary symptoms, diarrhea.      Abdominal Pain      Prior to Admission medications   Medication Sig Start Date End Date Taking? Authorizing Provider  acetaminophen  (TYLENOL ) 325 MG tablet Take 2 tablets (650 mg total) by mouth every 4 (four) hours as needed for mild pain (pain score 1-3), moderate pain (pain score 4-6), fever or headache (or Fever >/= 101). 06/03/23   Patsy Lenis, MD  apixaban  (ELIQUIS ) 5 MG TABS tablet TAKE 1 TABLET BY MOUTH TWICE A DAY 10/30/23   Patwardhan, Manish J, MD  cholecalciferol (VITAMIN D3) 25 MCG (1000 UNIT) tablet Take 3,000 Units by mouth daily.    [provider]  clonazePAM  (KLONOPIN ) 0.5 MG tablet Take 1 tablet (0.5 mg total) by mouth daily as needed. 06/03/23   Patsy Lenis, MD  diltiazem  (CARDIZEM  CD) 120 MG 24 hr capsule Take 1 capsule (120 mg total) by mouth daily. 07/09/23 10/07/23  Terra Fairy PARAS, PA-C  gabapentin  (NEURONTIN ) 300 MG capsule Take 1 capsule (300 mg total) by mouth at bedtime. 06/03/23   Patsy Lenis, MD  insulin   aspart (NOVOLOG ) 100 UNIT/ML injection Inject 1-6 Units into the skin 4 (four) times daily -  before meals and at bedtime. CBG 151 - 200: 1 unit, CBG 201-250: 2 units, CBG 251-300: 3 units, CBG 301-350: 4 units, CBG 351-400: 5 units, CBG > 400: Give 6 units 06/03/23   Patsy Lenis, MD  Melatonin 10 MG TABS Take 20 mg by mouth at bedtime as needed (Sleep).    [provider]  polyethylene glycol (MIRALAX  / GLYCOLAX ) 17 g packet Take 17 g by mouth daily as needed. 06/03/23   Patsy Lenis, MD  senna-docusate (SENOKOT-S) 8.6-50 MG tablet Take 1 tablet by mouth 2 (two) times daily. 06/03/23   Patsy Lenis, MD  tamsulosin  (FLOMAX ) 0.4 MG CAPS capsule Take 0.4 mg by mouth daily.    [provider]  TOUJEO  SOLOSTAR 300 UNIT/ML Solostar Pen Inject 10 Units into the skin in the morning. 06/03/23   Patsy Lenis, MD    Allergies: Tape    Review of Systems  Gastrointestinal:  Positive for abdominal pain.  All other systems reviewed and are negative.   Updated Vital Signs BP (!) 148/77   Pulse 88   Temp 97.6 F (36.4 C) (Oral)   Resp 12   SpO2 100%   Physical Exam Vitals and nursing note reviewed.  Constitutional:      General: He is not in acute distress.    Appearance: Normal appearance.  HENT:     Head: Normocephalic and atraumatic.  Eyes:     General:        Right eye: No discharge.        Left eye: No discharge.  Cardiovascular:     Comments: Regular rate and rhythm.  S1/S2 are distinct without any evidence of murmur, rubs, or gallops.  Radial pulses are 2+ bilaterally.  Dorsalis pedis pulses are 2+ bilaterally.  No evidence of pedal edema. Pulmonary:     Comments: Clear to auscultation bilaterally.  Normal effort.  No respiratory distress.  No evidence of wheezes, rales, or rhonchi heard throughout. Abdominal:     General: Abdomen is flat. Bowel sounds are normal. There is no distension.     Tenderness: There is no guarding or rebound.     Comments: Generalized  upper abdominal tenderness.  Musculoskeletal:        General: Normal range of motion.     Cervical back: Neck supple.  Skin:    General: Skin is warm and dry.     Findings: No rash.  Neurological:     General: No focal deficit present.     Mental Status: He is alert.  Psychiatric:        Mood and Affect: Mood normal.        Behavior: Behavior normal.     (all labs ordered are listed, but only abnormal results are displayed) Labs Reviewed  COMPREHENSIVE METABOLIC PANEL WITH GFR - Abnormal; Notable for the following components:      Result Value   Glucose, Bld 114 (*)    Creatinine, Ser 0.58 (*)    Total Protein 6.1 (*)    All other components within normal limits  URINALYSIS, ROUTINE W REFLEX MICROSCOPIC - Abnormal; Notable for the following components:   Color, Urine STRAW (*)    Hgb urine dipstick SMALL (*)    All other components within normal limits  I-STAT CHEM 8, ED - Abnormal; Notable for the following components:   Glucose, Bld 109 (*)    All other components within normal limits  LIPASE, BLOOD  CBC    EKG: None  Radiology: CT ABDOMEN PELVIS W CONTRAST Result Date: 03/03/2024 CLINICAL DATA:  Abdominal pain EXAM: CT ABDOMEN AND PELVIS WITH CONTRAST TECHNIQUE: Multidetector CT imaging of the abdomen and pelvis was performed using the standard protocol following bolus administration of intravenous contrast. RADIATION DOSE REDUCTION: This exam was performed according to the departmental dose-optimization program which includes automated exposure control, adjustment of the mA and/or kV according to patient size and/or use of iterative reconstruction technique. CONTRAST:  OMNIPAQUE  IOHEXOL  300 MG/ML  SOLN COMPARISON:  Abdomen and pelvis 02/29/2025 FINDINGS: Lower chest: There is atelectasis or scarring left lung base. Hepatobiliary: There is a left hepatic cysts measuring 1 cm. Gallbladder and bile ducts are within normal limits. Pancreas: Unremarkable. No pancreatic  ductal dilatation or surrounding inflammatory changes. Spleen: Normal in size without focal abnormality. Adrenals/Urinary Tract: Small left peripelvic cysts. Is a cyst in the inferior pole of the left kidney measuring 1.5 cm. Otherwise, the kidneys, adrenal glands bladder are within normal limits. Stomach/Bowel: Stomach is within normal limits. Appendix appears normal. No evidence of bowel wall thickening, distention, or inflammatory changes. Vascular/Lymphatic: Aortic atherosclerosis. No enlarged abdominal or pelvic lymph nodes. Reproductive: Prostate is unremarkable. Other: No abdominal wall hernia or abnormality. No abdominopelvic ascites. Musculoskeletal: L1 compression deformity is chronic and unchanged. There severe degenerative changes throughout the spine. Left-sided hip screw is present.  IMPRESSION: 1. No acute localizing process in the abdomen or pelvis. 2. Left renal cysts. No follow-up imaging recommended. 3. Aortic atherosclerosis. Aortic Atherosclerosis (ICD10-I70.0). Electronically Signed   By: Greig Pique M.D.   On: 03/03/2024 21:36     Procedures   Medications Ordered in the ED  ketorolac (TORADOL) 15 MG/ML injection (has no administration in time range)  alum & mag hydroxide-simeth (MAALOX/MYLANTA) 200-200-20 MG/5ML suspension (has no administration in time range)  lidocaine  (XYLOCAINE ) 2 % viscous mouth solution (has no administration in time range)  iohexol  (OMNIPAQUE ) 300 MG/ML solution 100 mL (100 mLs Intravenous Contrast Given 03/03/24 2046)  ketorolac (TORADOL) 15 MG/ML injection 15 mg (15 mg Intravenous Given 03/03/24 2251)  alum & mag hydroxide-simeth (MAALOX/MYLANTA) 200-200-20 MG/5ML suspension 30 mL (30 mLs Oral Given 03/03/24 2251)  lidocaine  (XYLOCAINE ) 2 % viscous mouth solution 15 mL (15 mLs Mouth/Throat Given 03/03/24 2251)    Clinical Course as of 03/03/24 2331  Tue Mar 03, 2024  2306 CBC Normal. [CF]  2306 Comprehensive metabolic panel(!) Normal. [CF]  2306  Urinalysis, Routine w reflex microscopic -Urine, Clean Catch(!) Normal. [CF]  2306 I-stat chem 8, ED (not at [facility], [facility] or [facility])(!) Normal. [CF]  2306 Lipase, blood Normal. [CF]  2307 CT ABDOMEN PELVIS W CONTRAST No significant findings.  I do agree with the radiologist interpretation. [CF]    Clinical Course User Index [CF] Theotis Cameron HERO, PA-C    Medical Decision Making MAISEN KLINGLER is a 89 y.o. male patient who presents to the emergency department today for further evaluation of abdominal pain. Abdominal exam without peritoneal signs. No evidence of acute abdomen at this time. Well appearing. Given work up, low suspicion for acute hepatobiliary disease (including acute cholecystitis or cholangitis), acute pancreatitis (neg lipase), PUD (including gastric perforation), acute infectious processes (pneumonia, hepatitis, pyelonephritis), acute appendicitis, vascular catastrophe, bowel obstruction, viscus perforation, or testicular torsion, diverticulitis. Presentation not consistent with other acute, emergent causes of abdominal pain at this time. Labs are all reassuring as well. CT abdomen is also normal. No signs of hepatobiliary disease. Will have him follow up with his primary care doctor. Strict return precautions were discussed. He is safe for discharge at this time.    Amount and/or Complexity of Data Reviewed Labs: ordered. Decision-making details documented in ED Course. Radiology: ordered. Decision-making details documented in ED Course.  Risk OTC drugs. Prescription drug management.     Final diagnoses:  Epigastric pain    ED Discharge Orders     None          Theotis Cameron HERO, [place] 03/03/24 2331    Gennaro Duwaine CROME, DO 03/05/24 1722

## 2024-03-03 NOTE — Discharge Instructions (Signed)
 As we discussed, I will like for you to follow-up with your primary care doctor for further evaluation.  No findings of emergent causes of your upset stomach today.  No evidence this is your gallbladder at this time.  You may return to the emergency department for any worsening symptoms.

## 2024-03-05 ENCOUNTER — Ambulatory Visit (HOSPITAL_COMMUNITY)
Admission: RE | Admit: 2024-03-05 | Discharge: 2024-03-05 | Disposition: A | Discharge status: Home / Self Care | Source: Ambulatory Visit

## 2024-03-05 DIAGNOSIS — K819 Cholecystitis, unspecified: Secondary | ICD-10-CM | POA: Insufficient documentation

## 2024-03-05 MED ORDER — TECHNETIUM TC 99M MEBROFENIN IV KIT
5.1400 | PACK | Freq: Once | INTRAVENOUS | Status: AC
  Administered 2024-03-05: 5.14 via INTRAVENOUS

## 2024-04-26 ENCOUNTER — Other Ambulatory Visit: Payer: Self-pay | Admitting: Cardiology

## 2024-04-26 DIAGNOSIS — I480 Paroxysmal atrial fibrillation: Secondary | ICD-10-CM
